# Patient Record
Sex: Female | Born: 1935
Health system: Southern US, Community
[De-identification: ages and names within clinical notes are randomized; demographics above are authoritative.]

## PROBLEM LIST (undated history)

## (undated) DIAGNOSIS — T7840XA Allergy, unspecified, initial encounter: Secondary | ICD-10-CM

## (undated) DIAGNOSIS — E785 Hyperlipidemia, unspecified: Secondary | ICD-10-CM

## (undated) DIAGNOSIS — M1990 Unspecified osteoarthritis, unspecified site: Secondary | ICD-10-CM

## (undated) DIAGNOSIS — M8580 Other specified disorders of bone density and structure, unspecified site: Secondary | ICD-10-CM

## (undated) DIAGNOSIS — I10 Essential (primary) hypertension: Secondary | ICD-10-CM

## (undated) DIAGNOSIS — K469 Unspecified abdominal hernia without obstruction or gangrene: Secondary | ICD-10-CM

## (undated) HISTORY — PX: EYE SURGERY: SHX253

## (undated) HISTORY — DX: Other specified disorders of bone density and structure, unspecified site: M85.80

## (undated) HISTORY — DX: Allergy, unspecified, initial encounter: T78.40XA

## (undated) HISTORY — PX: BACK SURGERY: SHX140

## (undated) HISTORY — PX: KNEE SURGERY: SHX244

## (undated) HISTORY — DX: Unspecified abdominal hernia without obstruction or gangrene: K46.9

## (undated) HISTORY — PX: APPENDECTOMY: SHX54

## (undated) HISTORY — DX: Hyperlipidemia, unspecified: E78.5

## (undated) HISTORY — PX: GUM SURGERY: SHX658

---

## 1999-08-05 ENCOUNTER — Encounter: Payer: Self-pay | Admitting: Specialist

## 1999-08-05 ENCOUNTER — Ambulatory Visit (HOSPITAL_COMMUNITY): Admission: RE | Admit: 1999-08-05 | Discharge: 1999-08-05 | Payer: Self-pay | Admitting: Specialist

## 1999-08-17 ENCOUNTER — Other Ambulatory Visit: Admission: RE | Admit: 1999-08-17 | Discharge: 1999-08-17 | Payer: Self-pay | Admitting: Family Medicine

## 2000-04-14 ENCOUNTER — Encounter (INDEPENDENT_AMBULATORY_CARE_PROVIDER_SITE_OTHER): Payer: Self-pay | Admitting: Specialist

## 2000-04-14 ENCOUNTER — Ambulatory Visit (HOSPITAL_COMMUNITY): Admission: RE | Admit: 2000-04-14 | Discharge: 2000-04-14 | Payer: Self-pay | Admitting: Gastroenterology

## 2000-08-08 ENCOUNTER — Encounter: Admission: RE | Admit: 2000-08-08 | Discharge: 2000-08-08 | Payer: Self-pay | Admitting: Family Medicine

## 2000-08-08 ENCOUNTER — Encounter: Payer: Self-pay | Admitting: Family Medicine

## 2000-12-18 ENCOUNTER — Encounter: Admission: RE | Admit: 2000-12-18 | Discharge: 2001-03-18 | Payer: Self-pay | Admitting: Family Medicine

## 2001-05-30 ENCOUNTER — Encounter: Admission: RE | Admit: 2001-05-30 | Discharge: 2001-08-28 | Payer: Self-pay | Admitting: Family Medicine

## 2002-08-18 ENCOUNTER — Emergency Department (HOSPITAL_COMMUNITY): Admission: EM | Admit: 2002-08-18 | Discharge: 2002-08-18 | Payer: Self-pay

## 2003-09-19 ENCOUNTER — Encounter: Admission: RE | Admit: 2003-09-19 | Discharge: 2003-09-19 | Payer: Self-pay | Admitting: Family Medicine

## 2003-10-01 ENCOUNTER — Other Ambulatory Visit: Admission: RE | Admit: 2003-10-01 | Discharge: 2003-10-01 | Payer: Self-pay | Admitting: Family Medicine

## 2004-03-31 ENCOUNTER — Encounter: Admission: RE | Admit: 2004-03-31 | Discharge: 2004-03-31 | Payer: Self-pay | Admitting: Family Medicine

## 2005-01-28 ENCOUNTER — Encounter: Admission: RE | Admit: 2005-01-28 | Discharge: 2005-01-28 | Payer: Self-pay | Admitting: Internal Medicine

## 2007-02-19 ENCOUNTER — Inpatient Hospital Stay (HOSPITAL_COMMUNITY): Admission: EM | Admit: 2007-02-19 | Discharge: 2007-02-22 | Payer: Self-pay | Admitting: Emergency Medicine

## 2007-03-30 ENCOUNTER — Encounter: Admission: RE | Admit: 2007-03-30 | Discharge: 2007-03-30 | Payer: Self-pay | Admitting: Family Medicine

## 2008-06-10 ENCOUNTER — Encounter: Admission: RE | Admit: 2008-06-10 | Discharge: 2008-06-10 | Payer: Self-pay | Admitting: Family Medicine

## 2010-11-23 NOTE — H&P (Signed)
NAMECADEY, BAZILE NO.:  [id number]   MEDICAL RECORD NO.:  [id number]          PATIENT TYPE:  EMS   LOCATION:  ED                           FACILITY:  [facility]   PHYSICIAN:  Isidor Holts, M.D.  DATE OF BIRTH:  [birth year]   DATE OF ADMISSION:  02/19/2007  DATE OF DISCHARGE:                              HISTORY & PHYSICAL   PMD:  Immunologist Care.   CHIEF COMPLAINT:  Unwell for one week.  Transient diarrhea approximately  5 days ago, increasing weakness, occasional mild cough, ache across  shoulders, fever of 101 on February 18, 2007.   HISTORY OF PRESENT ILLNESS:  This is an 77 year old female.  For past  medical history, see below.  According to the patient, she had been  unwell since approximately one week ago, initially with burning on  urination, as a matter of fact, has had urinalysis done twice by her  primary MD at [facility], and these were negative.  Approximately 5 days ago, she had a transient episode of diarrhea, which  has since resolved.  Since then, however, she has felt progressively  weak.  Appetite is diminished.  She is not drinking adequate fluids and  has felt progressively unwell.  She has a mild cough, which is  occasionally productive, however denies shortness of breath.  Has  experienced some pain across her shoulders.  On February 18, 2007, she  went to the urgent care center, where she was commenced on Septra.  She  has taken 2 tablets of this on February 18, 2007.  Her symptoms appear to  be persistent.  She presented to the emergency department.   PAST MEDICAL HISTORY:  1. Status post appendectomy at age 89 years.  2. DJD, status post back surgery in 1963.  3. Osteoarthritis, status post right knee arthroscopy 2-3 years ago.   MEDICATION HISTORY:  Not on any regular medication.  As mentioned above,  she to 2 tablets of Septra prescribed by urgent care center on February 18, 2007.   ALLERGIES:  No known drug  allergies.   REVIEW OF SYSTEMS:  As per HPI and chief complaint.  The patient was  reportedly noted to have a temperature of 101 at the urgent care center  on February 18, 2007.  Has had lower abdominal pain, although this has  resolved.  Has had only very small bowel movements during the course of  the last 3-4 days.  In addition, has been troubled by skin eruptions,  one on the right side of her neck, another just below her left axilla,  and yet another on the left upper anterior chest.  She claims she had  been prescribed some cream by her primary MD at [facility].  The lesions appear to have improved somewhat.  Systems review is  otherwise negative.   SOCIAL HISTORY:  The patient resides alone, is quite independent doing  her own cooking and shopping.  Her family comes to visit often.  She is  a nonsmoker, nondrinker.  Has no history of drug abuse.  Is single.  FAMILY HISTORY:  Noncontributory.   PHYSICAL EXAMINATION:  VITALS:  Temperature 100.0, pulse 84 per minute  and regular, respiratory rate 22, BP 119/46 mmHg, pulse oximeter 97% on  2 L of oxygen.  The patient does not appear to be in obvious acute distress, alert,  communicative, not short of breath at rest.  HEENT: No clinical pallor or jaundice.  No conjunctival injection.  Throat:  The visible mucous membranes appear quite dry.  NECK:  Supple.  JVP not seen.  No palpable lymphadenopathy.  No palpable  goiter.  SKIN:  A pyodermatous lesion which appears partially healed on the right  side of her neck just above the clavicle.  There is a similar lesion  just below the right axillary region and also left upper anterior chest.  CHEST:  Clinically clear to auscultation.  No wheezes, no  crackles.  HEART:  Sounds 1 and 2 heard, normal, regular, no murmurs.  ABDOMEN:  Obese, soft, nontender.  Old lower abdominal laparotomy scar  is noted.  No palpable organomegaly.  No palpable masses.  Normal bowel  sounds.  LOWER  EXTREMITY EXAMINATION:  No pitting edema.  Palpable peripheral  pulses.  MUSCULOSKELETAL SYSTEM:  Unremarkable apart from osteoarthritic changes.  CENTRAL NERVOUS SYSTEM:  No focal neurologic deficit on gross  examination.   INVESTIGATIONS:  CBC:  WBC 13.8, hemoglobin 15.2, hematocrit 42.8,  platelets 264.  Electrolytes:  Sodium 131, potassium 3.6, chloride 99,  CO2 21, BUN 13, creatinine 0.94, glucose 233, AST 23, ALT 21, alkaline  phosphatase 68.  Urinalysis is negative.  Chest x-ray dated February 19, 2007, shows minimal airspace disease, left base.   ASSESSMENT AND PLAN:  1. Mild left lower lobe community-acquired pneumonia.  We shall admit      the patient.  Institute intravenous antibiotics with      Rocephin/Azithromycin, also utilize Mucinex and p.r.n.      bronchodilator nebulizers/oxygen supplementation.   1. Pyodermatous skin lesions.  These appear practically healed;      however, any residual lesions will respond to antibiotic treatment      as above.   1. Dehydration.  This is secondary to poor oral intake, and recent      transient diarrhea.  We shall manage with intravenous infusion of      normal saline.   1. Mild hyponatremia.  This is secondary to volume depletion and      should respond to normal saline.   Further management will depend on clinical course.      Isidor Holts, M.D.  Electronically Signed     CO/MEDQ  D:  02/19/2007  T:  02/20/2007  Job:  [id number]

## 2010-11-23 NOTE — Discharge Summary (Signed)
[name], Catherine Ellis              ACCOUNT NO.:  [id number]   MEDICAL RECORD NO.:  [id number]          PATIENT TYPE:  INP   LOCATION:  1323                         FACILITY:  [facility]   PHYSICIAN:  Lonia Blood, M.D.      DATE OF BIRTH:  09-16-1921   DATE OF ADMISSION:  02/19/2007  DATE OF DISCHARGE:  02/21/2007                               DISCHARGE SUMMARY   PRIMARY CARE PHYSICIAN:  Immunologist Care.   DISCHARGE DIAGNOSIS:  1. Community-acquired pneumonia.  2. Hyponatremia.  3. Dehydration.  4. Tooth abscess.   DISCHARGE MEDICATIONS:  Levaquin 500 mg daily for 5 days.   DISPOSITION:  The patient will be discharged home.  She will have a  follow up with [facility] in about a week.  She has had good  physical therapy and occupational therapy evaluation; and she is going  to need no home therapy at this point.   PROCEDURES PERFORMED INCLUDE:  Chest x-ray on August 11 that showed  minimal air space disease at the left base most likely represents  atelectasis or early infection.  Follow-up chest x-ray on August 13 that  shows mildly reduced left basilar opacity differential diagnosis  includes scattered subsegmental atelectasis or mildly improving  bronchopneumonia.   CONSULTATIONS:  None.   BRIEF HISTORY AND PHYSICAL:  Please refer to dictated history and  physical by Dr. Isidor Holts.  In short, however, the patient is an  77 year old female remarkably healthy with no significant past medical  history presenting with 1 week history of feeling unwell, increasing  weakness, some diarrhea, cough and aching.  The patient also had a fever  of 101 the day prior to coming in.  She was evaluated in the ER with a  mild left lower lobe infiltrate consistent with possible pneumonia.  She  also had some pyodermatous skin lesions, some dehydration, and mild  hyponatremia with a sodium of 131.  She was subsequently admitted for  further management.   HOSPITAL COURSE:   Problem #1: COMMUNITY-ACQUIRED PNEUMONIA.  The patient  was put on Rocephin and Zithromax during this hospitalization.  She had  no fever and she seemed to improve tremendously.  Her weakness also  responded to extensive physical therapy and occupational therapy.   Problem #2:  HYPONATREMIA.  She received some saline and her sodium has  since normalized.   Problem #3:  DEHYDRATION.  Again, the patient was hydrated effectively  during this hospitalization.   Problem #4:  PYODERMATOUS SKIN LESIONS.  This was mostly healed at the  time of arrival; but continued antibiotic therapy probably helped some,  with Rocephin.   Problem #5:  TOOTH ABSCESS.  The patient complained of tooth abscess at  the time of discharge.  We are giving her some Levaquin that should  cover both the tooth abscess and her pneumonia.  She is asked to follow  with her dentist for further management.      Lonia Blood, M.D.  Electronically Signed     LG/MEDQ  D:  02/22/2007  T:  02/23/2007  Job:  [id number]

## 2010-11-26 NOTE — Procedures (Signed)
[facility]  Patient:    Catherine Ellis, Catherine Ellis                       MRN: [id number] Proc. Date: 04/14/00 Adm. Date:  [id number] Attending:  Nelda Marseille CC:         Dellis Anes. Idell Pickles, M.D.   Procedure Report  PROCEDURE:  Colonoscopy with biopsy.  SURGEON:  Petra Kuba, M.D.  INDICATIONS:  Patient with history of colon polyps, due for colonic screening. Some vague lower periodic abdominal discomfort.  Consent was signed after risks, benefits, methods, and options were thoroughly discussed in the office on multiple occasions.  MEDICINES USED:  Demerol 50 and Versed 5.  DESCRIPTION OF PROCEDURE:  Rectal inspection is pertinent for external hemorrhoids.  Digital exam was negative.  The pediatric video colonoscope was inserted and easily advanced to the splenic flexure.  Unfortunately, at that point, the scope began to loop, and we could not control the loop using abdominal pressure or position changes despite rolling her on her back and then her right side.  We elected to slowly withdraw.  No abnormalities were seen as we slowly withdrew except for some left-sided diverticuli and tortuosity.  Once back in the rectum, the previous polypectomy site was seen with a nice white scar.  The scope was then retroflexed revealing some internal hemorrhoids.  The pediatric video colonoscope was removed and then the video colonoscope was inserted and with surprising ease, advanced around the colon to the cecum once it had looped at the _______ placed in the splenic flexure, and left lower quadrant pressure was applied, and we were able to advance to the cecum which was identified by the appendiceal orifice and the ileocecal valve.  The scope was slowly withdrawn.  The prep was adequate.  There was some liquid stool still that required washing and suctioning.  The cecum, ascending, and transverse was normal.  In the proximal level of the splenic flexure, a small 2  mm polyp was seen and was cold biopsied x 2.  The scope was further withdrawn, and other than the left-sided diverticuli, and tortuosity and some spasm, no other abnormalities were seen.  The anorectal pull through had confirmed the hemorrhoids.  We did not reretroflex since we did that with the pediatric video colonoscope.  The scope was reinserted a short ways up the sigmoid.  Air was suctioned and the scope removed.  The patient tolerated the procedure well and there was no obvious or immediate complication.  ENDOSCOPIC DIAGNOSES: 1. Internal and external hemorrhoids. 2. Sigmoid diverticuli and tortuosity. 3. Tiny splenic flexure polyp, status post cold biopsy. 4. Otherwise within normal limits to the cecum.  PLAN:  Will await pathology, but if doing well medically, consider repeat screening in five years.  Will be happy to see back p.r.n., otherwise return care to Dr. Idell Pickles for the customary yearly rectals and guaiacs.  If abdominal pain continues, will probably get a CAT scan next, although possibly just due to adhesion and will be happy to see back for this if needed.  We want to try to antispasmodics as well, but will leave that to Dr. Idell Pickles as well. DD:  04/14/00 TD:  04/16/00 Job: [id number]

## 2011-04-25 LAB — CBC
HCT: 36.2
HCT: 36.3
HCT: 42.8
Hemoglobin: 12.6
Hemoglobin: 12.7
Hemoglobin: 15.2 — ABNORMAL HIGH
MCHC: 34.6
MCHC: 35.1
MCHC: 35.5
MCV: 87.5
MCV: 88.1
MCV: 88.4
Platelets: 252
Platelets: 262
Platelets: 264
RBC: 4.11
RBC: 4.11
RBC: 4.89
RDW: 13.1
RDW: 13.2
RDW: 13.3
WBC: 10.4
WBC: 11 — ABNORMAL HIGH
WBC: 13.8 — ABNORMAL HIGH

## 2011-04-25 LAB — DIFFERENTIAL
Basophils Absolute: 0
Basophils Relative: 0
Eosinophils Absolute: 0
Eosinophils Relative: 0
Lymphocytes Relative: 7 — ABNORMAL LOW
Lymphs Abs: 0.9
Monocytes Absolute: 0.8 — ABNORMAL HIGH
Monocytes Relative: 6
Neutro Abs: 12.1 — ABNORMAL HIGH
Neutrophils Relative %: 88 — ABNORMAL HIGH

## 2011-04-25 LAB — COMPREHENSIVE METABOLIC PANEL
ALT: 21
AST: 23
Albumin: 3.2 — ABNORMAL LOW
Alkaline Phosphatase: 68
BUN: 13
CO2: 21
Calcium: 8.8
Chloride: 99
Creatinine, Ser: 0.94
GFR calc non Af Amer: 57 — ABNORMAL LOW
Glucose, Bld: 233 — ABNORMAL HIGH
Potassium: 3.6
Sodium: 131 — ABNORMAL LOW
Total Bilirubin: 0.5
Total Protein: 7.2

## 2011-04-25 LAB — BASIC METABOLIC PANEL
BUN: 10
BUN: 8
BUN: 9
CO2: 24
CO2: 25
CO2: 28
Calcium: 8.2 — ABNORMAL LOW
Calcium: 8.4
Calcium: 8.7
Chloride: 101
Chloride: 102
Chloride: 102
Creatinine, Ser: 0.78
Creatinine, Ser: 0.83
Creatinine, Ser: 0.91
GFR calc non Af Amer: 59 — ABNORMAL LOW
GFR calc non Af Amer: >60
GFR calc non Af Amer: >60
Glucose, Bld: 132 — ABNORMAL HIGH
Glucose, Bld: 138 — ABNORMAL HIGH
Glucose, Bld: 146 — ABNORMAL HIGH
Potassium: 3.5
Potassium: 3.6
Potassium: 4
Sodium: 132 — ABNORMAL LOW
Sodium: 133 — ABNORMAL LOW
Sodium: 136

## 2011-04-25 LAB — LIPID PANEL
HDL: 30 — ABNORMAL LOW
Total CHOL/HDL Ratio: 6.1
Triglycerides: 168 — ABNORMAL HIGH
VLDL: 34

## 2011-04-25 LAB — URINE CULTURE
Colony Count: NO GROWTH
Culture: NO GROWTH

## 2011-04-25 LAB — URINE MICROSCOPIC-ADD ON

## 2011-04-25 LAB — URINALYSIS, ROUTINE W REFLEX MICROSCOPIC
Bilirubin Urine: NEGATIVE
Glucose, UA: NEGATIVE
Ketones, ur: NEGATIVE
Leukocytes, UA: NEGATIVE
Nitrite: NEGATIVE
Protein, ur: 100 — AB
Specific Gravity, Urine: 1.03
Urobilinogen, UA: 0.2
pH: 6

## 2011-04-25 LAB — CULTURE, BLOOD (ROUTINE X 2)
Culture: NO GROWTH
Culture: NO GROWTH

## 2011-04-25 LAB — TSH: TSH: 1.313

## 2011-04-25 LAB — B-NATRIURETIC PEPTIDE (CONVERTED LAB): Pro B Natriuretic peptide (BNP): 93.4

## 2011-06-12 ENCOUNTER — Encounter: Payer: Self-pay | Admitting: Emergency Medicine

## 2011-06-12 ENCOUNTER — Emergency Department (HOSPITAL_COMMUNITY)
Admission: EM | Admit: 2011-06-12 | Discharge: 2011-06-12 | Disposition: A | Discharge status: Home / Self Care | Payer: Medicare Other | Attending: Emergency Medicine | Admitting: Emergency Medicine

## 2011-06-12 ENCOUNTER — Emergency Department (HOSPITAL_COMMUNITY): Payer: Medicare Other

## 2011-06-12 DIAGNOSIS — I10 Essential (primary) hypertension: Secondary | ICD-10-CM | POA: Insufficient documentation

## 2011-06-12 DIAGNOSIS — W010XXA Fall on same level from slipping, tripping and stumbling without subsequent striking against object, initial encounter: Secondary | ICD-10-CM | POA: Insufficient documentation

## 2011-06-12 DIAGNOSIS — J3489 Other specified disorders of nose and nasal sinuses: Secondary | ICD-10-CM | POA: Insufficient documentation

## 2011-06-12 DIAGNOSIS — S0003XA Contusion of scalp, initial encounter: Secondary | ICD-10-CM | POA: Insufficient documentation

## 2011-06-12 DIAGNOSIS — S1093XA Contusion of unspecified part of neck, initial encounter: Secondary | ICD-10-CM | POA: Insufficient documentation

## 2011-06-12 DIAGNOSIS — IMO0002 Reserved for concepts with insufficient information to code with codable children: Secondary | ICD-10-CM | POA: Insufficient documentation

## 2011-06-12 DIAGNOSIS — W19XXXA Unspecified fall, initial encounter: Secondary | ICD-10-CM

## 2011-06-12 DIAGNOSIS — Y92009 Unspecified place in unspecified non-institutional (private) residence as the place of occurrence of the external cause: Secondary | ICD-10-CM | POA: Insufficient documentation

## 2011-06-12 DIAGNOSIS — E119 Type 2 diabetes mellitus without complications: Secondary | ICD-10-CM | POA: Insufficient documentation

## 2011-06-12 DIAGNOSIS — Y93K1 Activity, walking an animal: Secondary | ICD-10-CM | POA: Insufficient documentation

## 2011-06-12 DIAGNOSIS — S0083XA Contusion of other part of head, initial encounter: Secondary | ICD-10-CM

## 2011-06-12 HISTORY — DX: Unspecified osteoarthritis, unspecified site: M19.90

## 2011-06-12 HISTORY — DX: Essential (primary) hypertension: I10

## 2011-06-12 NOTE — ED Notes (Signed)
Pt states she tripped over a dog while in driveway, abrasions noted to nose, forehead. Pt also c/o L knee pain. Pt A & O. Denies LOC. Family at bedside

## 2011-06-12 NOTE — ED Provider Notes (Signed)
History     CSN: [id number] Arrival date & time: 06/12/2011  7:57 PM   First MD Initiated Contact with Patient 06/12/11 2154      Chief Complaint  Patient presents with  . Fall    (Consider location/radiation/quality/duration/timing/severity/associated sxs/prior treatment) HPI... accidental trip and fall this afternoon while walking dog. Struck forehead and left knee. Loss of consciousness or neurological deficits. Has abrasion on forehead and left knee. Palpation makes pain worse. No neck pain.  Pain is minimal  Past Medical History  Diagnosis Date  . Arthritis   . Diabetes mellitus   . Hypertension     Past Surgical History  Procedure Date  . Back surgery     1965  . Knee surgery   . Appendectomy     No family history on file.  History  Substance Use Topics  . Smoking status: Never Smoker   . Smokeless tobacco: Not on file  . Alcohol Use: No    OB History    Grav Para Term Preterm Abortions TAB SAB Ect Mult Living                  Review of Systems  All other systems reviewed and are negative.    Allergies  Review of patient's allergies indicates no known allergies.  Home Medications   Current Outpatient Rx  Name Route Sig Dispense Refill  . PRESCRIPTION MEDICATION Oral Take 1 tablet by mouth daily.     Marland Kitchen PRESCRIPTION MEDICATION Oral Take 1 tablet by mouth 2 (two) times daily.        BP 180/68  Pulse 113  Temp(Src) 97.9 F (36.6 C) (Oral)  Resp 18  Wt 160 lb (72.576 kg)  SpO2 97%  Physical Exam  Nursing note and vitals reviewed. Constitutional: She is oriented to person, place, and time. She appears well-developed and well-nourished.  HENT:  Head: Normocephalic.       Superficial abrasion in mid forehead, approximately 2 cm square;  minimal abrasion of left knee  Eyes: Conjunctivae and EOM are normal. Pupils are equal, round, and reactive to light.  Neck: Normal range of motion. Neck supple.  Cardiovascular: Normal rate and regular  rhythm.   Pulmonary/Chest: Effort normal and breath sounds normal.  Abdominal: Soft. Bowel sounds are normal.  Musculoskeletal: Normal range of motion.  Neurological: She is alert and oriented to person, place, and time.  Skin: Skin is warm and dry.  Psychiatric: She has a normal mood and affect.    ED Course  Procedures (including critical care time)  Labs Reviewed - No data to display Ct Head Wo Contrast  06/12/2011  *RADIOLOGY REPORT*  Clinical Data: Status post fall.  Forehead and nasal abrasions.  CT HEAD WITHOUT CONTRAST  Technique:  Contiguous axial images were obtained from the base of the skull through the vertex without contrast.  Comparison: None.  Findings: There is soft tissue swelling in the left frontal scalp. There is no evidence of acute intracranial hemorrhage, mass lesion, brain edema or extra-axial fluid collection.  The ventricles and subarachnoid spaces are appropriately sized for age.  There is no CT evidence of acute cortical infarction.  Mild chronic small vessel ischemic changes are present within the periventricular and subcortical white matter.  The visualized paranasal sinuses are clear aside from near-complete opacification of the left maxillary sinus.  No air-fluid levels are identified and mastoids and middle ears are clear. The calvarium is intact.  IMPRESSION:  1.  Left frontal scalp soft  tissue swelling. 2.  No acute intracranial or calvarial findings. 3.  Chronic left maxillary sinus disease.  Original Report Authenticated By: Gerrianne Scale, M.D.     1. Fall   2. Contusion of forehead       MDM  Will obtain CT head rule out subdural hematoma.  He is alert and oriented. Anticipate normal CT.        Donnetta Hutching, MD 06/12/11 [phone]

## 2011-06-12 NOTE — ED Notes (Signed)
Pt tripped over dog today falling face first onto driveway. Abrasions noted to bride of nose, L knee and forehead, pt denies LOC

## 2011-06-26 ENCOUNTER — Ambulatory Visit (INDEPENDENT_AMBULATORY_CARE_PROVIDER_SITE_OTHER): Payer: Medicare Other

## 2011-06-26 DIAGNOSIS — I10 Essential (primary) hypertension: Secondary | ICD-10-CM

## 2011-06-26 DIAGNOSIS — E119 Type 2 diabetes mellitus without complications: Secondary | ICD-10-CM

## 2011-07-14 DIAGNOSIS — M5137 Other intervertebral disc degeneration, lumbosacral region: Secondary | ICD-10-CM | POA: Diagnosis not present

## 2011-07-14 DIAGNOSIS — M25559 Pain in unspecified hip: Secondary | ICD-10-CM | POA: Diagnosis not present

## 2011-07-18 DIAGNOSIS — M25559 Pain in unspecified hip: Secondary | ICD-10-CM | POA: Diagnosis not present

## 2011-07-25 DIAGNOSIS — M25559 Pain in unspecified hip: Secondary | ICD-10-CM | POA: Diagnosis not present

## 2011-08-23 ENCOUNTER — Telehealth: Payer: Self-pay

## 2011-08-23 NOTE — Telephone Encounter (Signed)
CALLED PT, BUSY TIMES 3. TRY TO CB LATER

## 2011-08-23 NOTE — Telephone Encounter (Signed)
.  [facility] PT WOULD LIKE A CALL BACK FROM DR Hal Hope REGARDING THE DOSAGE INCREASE ON HER BP MEDS PLEASE CALL [phone]

## 2011-08-24 ENCOUNTER — Telehealth: Payer: Self-pay

## 2011-08-24 NOTE — Telephone Encounter (Signed)
Pharmacist states med does not come in 10/25. It comes 10/12.5 and 20/25.
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
## 2011-08-24 NOTE — Telephone Encounter (Signed)
Lisinopril 20 mg daily called into [facility] #30/2RF

## 2011-08-24 NOTE — Telephone Encounter (Signed)
Please call in lisinopril-hct 10/25 daily #30/2 refills

## 2011-09-14 ENCOUNTER — Telehealth: Payer: Self-pay

## 2011-09-14 MED ORDER — METFORMIN HCL 500 MG PO TABS: 500.0000 mg | ORAL_TABLET | Freq: Two times a day (BID) | ORAL | Status: DC

## 2011-09-14 NOTE — Telephone Encounter (Signed)
PATIENT NEEDS A REFILL FROM DR. RICHTER ON HER MEDIFORMAN 500 MG.   USING THE [facility] [address]

## 2011-09-14 NOTE — Telephone Encounter (Signed)
RX SENT INTO PHARMACY AND PT NOTIFIED

## 2011-10-27 NOTE — Telephone Encounter (Signed)
test

## 2011-11-25 ENCOUNTER — Other Ambulatory Visit: Payer: Self-pay | Admitting: Family Medicine

## 2011-11-25 ENCOUNTER — Other Ambulatory Visit: Payer: Self-pay | Admitting: *Deleted

## 2011-11-25 MED ORDER — LISINOPRIL 20 MG PO TABS: 20.0000 mg | ORAL_TABLET | Freq: Every day | ORAL | Status: DC

## 2011-12-01 ENCOUNTER — Encounter: Payer: Self-pay | Admitting: Family Medicine

## 2011-12-01 ENCOUNTER — Ambulatory Visit (INDEPENDENT_AMBULATORY_CARE_PROVIDER_SITE_OTHER): Payer: Medicare Other | Admitting: Family Medicine

## 2011-12-01 VITALS — BP 140/70 | HR 85 | Temp 97.5°F | Resp 18 | Ht 62.5 in | Wt 160.2 lb

## 2011-12-01 DIAGNOSIS — N3941 Urge incontinence: Secondary | ICD-10-CM

## 2011-12-01 DIAGNOSIS — I10 Essential (primary) hypertension: Secondary | ICD-10-CM | POA: Diagnosis not present

## 2011-12-01 DIAGNOSIS — M545 Low back pain, unspecified: Secondary | ICD-10-CM

## 2011-12-01 DIAGNOSIS — F32A Depression, unspecified: Secondary | ICD-10-CM

## 2011-12-01 DIAGNOSIS — F329 Major depressive disorder, single episode, unspecified: Secondary | ICD-10-CM | POA: Diagnosis not present

## 2011-12-01 DIAGNOSIS — E119 Type 2 diabetes mellitus without complications: Secondary | ICD-10-CM | POA: Diagnosis not present

## 2011-12-01 LAB — POCT URINALYSIS DIPSTICK
Bilirubin, UA: NEGATIVE
Glucose, UA: NEGATIVE
Ketones, UA: NEGATIVE
Leukocytes, UA: NEGATIVE
Nitrite, UA: NEGATIVE
Protein, UA: NEGATIVE
Spec Grav, UA: 1.02
Urobilinogen, UA: 0.2
pH, UA: 5.5

## 2011-12-01 LAB — POCT UA - MICROSCOPIC ONLY
Bacteria, U Microscopic: NEGATIVE
Casts, Ur, LPF, POC: NEGATIVE
Crystals, Ur, HPF, POC: NEGATIVE
Epithelial cells, urine per micros: NEGATIVE
Mucus, UA: NEGATIVE
WBC, Ur, HPF, POC: NEGATIVE
Yeast, UA: NEGATIVE

## 2011-12-01 LAB — POCT CBC
Granulocyte percent: 47.9 %G (ref 37–80)
HCT, POC: 44.6 % (ref 37.7–47.9)
Hemoglobin: 14.5 g/dL (ref 12.2–16.2)
Lymph, poc: 4.9 — AB (ref 0.6–3.4)
MCH, POC: 29.5 pg (ref 27–31.2)
MCHC: 32.5 g/dL (ref 31.8–35.4)
MCV: 90.6 fL (ref 80–97)
MID (cbc): 0.8 (ref 0–0.9)
MPV: 9.6 fL (ref 0–99.8)
POC Granulocyte: 5.3 (ref 2–6.9)
POC LYMPH PERCENT: 44.7 %L (ref 10–50)
POC MID %: 7.4 %M (ref 0–12)
Platelet Count, POC: 388 10*3/uL (ref 142–424)
RBC: 4.92 M/uL (ref 4.04–5.48)
RDW, POC: 13.6 %
WBC: 11 10*3/uL — AB (ref 4.6–10.2)

## 2011-12-01 LAB — COMPREHENSIVE METABOLIC PANEL
ALT: 12 U/L (ref 0–35)
AST: 16 U/L (ref 0–37)
Albumin: 4.7 g/dL (ref 3.5–5.2)
Alkaline Phosphatase: 65 U/L (ref 39–117)
BUN: 15 mg/dL (ref 6–23)
CO2: 26 mEq/L (ref 19–32)
Calcium: 10.1 mg/dL (ref 8.4–10.5)
Chloride: 103 mEq/L (ref 96–112)
Creat: 0.78 mg/dL (ref 0.50–1.10)
Glucose, Bld: 102 mg/dL — ABNORMAL HIGH (ref 70–99)
Potassium: 5.2 mEq/L (ref 3.5–5.3)
Sodium: 140 mEq/L (ref 135–145)
Total Bilirubin: 0.8 mg/dL (ref 0.3–1.2)
Total Protein: 7.9 g/dL (ref 6.0–8.3)

## 2011-12-01 LAB — POCT GLYCOSYLATED HEMOGLOBIN (HGB A1C): Hemoglobin A1C: 6.1

## 2011-12-01 MED ORDER — TOLTERODINE TARTRATE ER 2 MG PO CP24: 2.0000 mg | ORAL_CAPSULE | Freq: Every day | ORAL | Status: DC

## 2011-12-01 MED ORDER — LISINOPRIL 20 MG PO TABS
20.0000 mg | ORAL_TABLET | Freq: Every day | ORAL | Status: DC
Start: 2011-12-01 — End: 2012-04-13

## 2011-12-01 MED ORDER — METFORMIN HCL 500 MG PO TABS: 500.0000 mg | ORAL_TABLET | Freq: Two times a day (BID) | ORAL | Status: DC
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
## 2011-12-04 ENCOUNTER — Encounter: Payer: Self-pay | Admitting: *Deleted

## 2011-12-16 ENCOUNTER — Telehealth: Payer: Self-pay | Admitting: Radiology

## 2011-12-16 ENCOUNTER — Ambulatory Visit (INDEPENDENT_AMBULATORY_CARE_PROVIDER_SITE_OTHER): Payer: Medicare Other | Admitting: Emergency Medicine

## 2011-12-16 VITALS — BP 135/67 | HR 86 | Temp 97.7°F | Resp 20 | Ht 62.0 in | Wt 162.0 lb

## 2011-12-16 DIAGNOSIS — Z9229 Personal history of other drug therapy: Secondary | ICD-10-CM

## 2011-12-16 DIAGNOSIS — Z23 Encounter for immunization: Secondary | ICD-10-CM

## 2011-12-16 DIAGNOSIS — B652 Schistosomiasis due to Schistosoma japonicum: Secondary | ICD-10-CM

## 2011-12-16 MED ORDER — TETANUS-DIPHTH-ACELL PERTUSSIS 5-2.5-18.5 LF-MCG/0.5 IM SUSP
0.5000 mL | Freq: Once | INTRAMUSCULAR | Status: AC
  Administered 2011-12-16: 0.5 mL via INTRAMUSCULAR

## 2011-12-16 MED ORDER — TUBERCULIN PPD 5 UNIT/0.1ML ID SOLN
5.0000 [IU] | Freq: Once | INTRADERMAL | Status: AC
  Administered 2011-12-16: 0.5 [IU] via INTRADERMAL

## 2011-12-16 NOTE — Telephone Encounter (Signed)
Pt CB and I gave her info that it looks like pt needs to have the Tdap and PPD done. Pt agreed to come in and have these done so that form can be complete. Form is in the front of pt's chart.

## 2011-12-16 NOTE — Telephone Encounter (Signed)
I left mssg for patient to call back about the forms for Dr Hal Hope to fill out. There is an area on form patient must complete. Also it looks like patient needs PPD and Tdap done in the office.

## 2011-12-16 NOTE — Progress Notes (Signed)
  Subjective:    Patient ID: Catherine Ellis, female    DOB: 1922/03/02, 78 y.o.   MRN: [id number]  HPI patient wants to go to  Friend's home to live. Dr. Alecia Lemming has completed the forms have her she needs a Tdap and PPD.    Review of Systems     Objective:   Physical Exam patient not examined to        Assessment & Plan:  Tdap and PPD and prescription for shingles.

## 2011-12-19 ENCOUNTER — Ambulatory Visit (INDEPENDENT_AMBULATORY_CARE_PROVIDER_SITE_OTHER): Payer: Medicare Other

## 2011-12-19 DIAGNOSIS — Z111 Encounter for screening for respiratory tuberculosis: Secondary | ICD-10-CM

## 2011-12-19 LAB — TB SKIN TEST
Induration: 0 mm
TB Skin Test: NEGATIVE

## 2011-12-29 ENCOUNTER — Other Ambulatory Visit: Payer: Self-pay | Admitting: Physician Assistant

## 2012-03-16 DIAGNOSIS — E119 Type 2 diabetes mellitus without complications: Secondary | ICD-10-CM | POA: Diagnosis not present

## 2012-03-16 DIAGNOSIS — Z961 Presence of intraocular lens: Secondary | ICD-10-CM | POA: Diagnosis not present

## 2012-03-16 DIAGNOSIS — H52209 Unspecified astigmatism, unspecified eye: Secondary | ICD-10-CM | POA: Diagnosis not present

## 2012-04-13 ENCOUNTER — Encounter: Payer: Self-pay | Admitting: Family Medicine

## 2012-04-13 ENCOUNTER — Ambulatory Visit (INDEPENDENT_AMBULATORY_CARE_PROVIDER_SITE_OTHER): Payer: Medicare Other | Admitting: Family Medicine

## 2012-04-13 VITALS — BP 120/84 | HR 97 | Temp 98.1°F | Resp 16 | Ht 63.0 in | Wt 169.0 lb

## 2012-04-13 DIAGNOSIS — E119 Type 2 diabetes mellitus without complications: Secondary | ICD-10-CM | POA: Insufficient documentation

## 2012-04-13 DIAGNOSIS — K6289 Other specified diseases of anus and rectum: Secondary | ICD-10-CM

## 2012-04-13 DIAGNOSIS — Z79899 Other long term (current) drug therapy: Secondary | ICD-10-CM | POA: Diagnosis not present

## 2012-04-13 DIAGNOSIS — Z9189 Other specified personal risk factors, not elsewhere classified: Secondary | ICD-10-CM | POA: Insufficient documentation

## 2012-04-13 DIAGNOSIS — R32 Unspecified urinary incontinence: Secondary | ICD-10-CM | POA: Insufficient documentation

## 2012-04-13 DIAGNOSIS — Z23 Encounter for immunization: Secondary | ICD-10-CM | POA: Diagnosis not present

## 2012-04-13 LAB — LIPID PANEL
Cholesterol: 277 mg/dL — ABNORMAL HIGH (ref 0–200)
HDL: 57 mg/dL (ref >39)
LDL Cholesterol: 173 mg/dL — ABNORMAL HIGH (ref 0–99)
Total CHOL/HDL Ratio: 4.9 Ratio
Triglycerides: 234 mg/dL — ABNORMAL HIGH (ref <150)
VLDL: 47 mg/dL — ABNORMAL HIGH (ref 0–40)

## 2012-04-13 LAB — POCT GLYCOSYLATED HEMOGLOBIN (HGB A1C): Hemoglobin A1C: 5.5
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
## 2012-04-14 LAB — MICROALBUMIN, URINE: Microalb, Ur: 2.12 mg/dL — ABNORMAL HIGH (ref 0.00–1.89)

## 2012-04-14 NOTE — Progress Notes (Signed)
Reviewed and agree.

## 2012-05-15 ENCOUNTER — Other Ambulatory Visit: Payer: Self-pay | Admitting: Family Medicine

## 2012-05-15 DIAGNOSIS — E785 Hyperlipidemia, unspecified: Secondary | ICD-10-CM

## 2012-05-15 DIAGNOSIS — E119 Type 2 diabetes mellitus without complications: Secondary | ICD-10-CM

## 2012-05-15 MED ORDER — LISINOPRIL 5 MG PO TABS: 5.0000 mg | ORAL_TABLET | Freq: Every day | ORAL | Status: DC

## 2012-05-15 MED ORDER — PRAVASTATIN SODIUM 40 MG PO TABS: 40.0000 mg | ORAL_TABLET | Freq: Every day | ORAL | Status: DC

## 2012-05-17 ENCOUNTER — Telehealth: Payer: Self-pay

## 2012-05-17 NOTE — Telephone Encounter (Signed)
Spoke with patient and she has been having a burning, thick, clear, phylum at night off and on for about a week.

## 2012-05-17 NOTE — Telephone Encounter (Signed)
The patient stated that she has been experiencing acid reflux symptoms.  The patient stated that she has recently moved to [facility] and now has a different lifestyle and eating pattern, but she is wanting to know what she should do to treat the symptoms.  Please call the patient at [phone].

## 2012-05-17 NOTE — Telephone Encounter (Signed)
I spoke to patient to advise of need for visit. She was transferred to appt scheduling.

## 2012-05-17 NOTE — Telephone Encounter (Signed)
She needs to be evaluated.  She can come in and be seen here (She saw Dr. Clelia Croft last), or she can explore establishing with [facility], that has providers see patients at [facility].

## 2012-05-18 ENCOUNTER — Ambulatory Visit (INDEPENDENT_AMBULATORY_CARE_PROVIDER_SITE_OTHER): Payer: Medicare Other | Admitting: Family Medicine

## 2012-05-18 ENCOUNTER — Encounter: Payer: Self-pay | Admitting: Family Medicine

## 2012-05-18 VITALS — BP 124/62 | HR 84 | Temp 97.5°F | Resp 18 | Ht 63.0 in | Wt 174.0 lb

## 2012-05-18 DIAGNOSIS — R5381 Other malaise: Secondary | ICD-10-CM

## 2012-05-18 DIAGNOSIS — L219 Seborrheic dermatitis, unspecified: Secondary | ICD-10-CM

## 2012-05-18 DIAGNOSIS — K219 Gastro-esophageal reflux disease without esophagitis: Secondary | ICD-10-CM | POA: Diagnosis not present

## 2012-05-18 DIAGNOSIS — R5383 Other fatigue: Secondary | ICD-10-CM

## 2012-05-18 DIAGNOSIS — L218 Other seborrheic dermatitis: Secondary | ICD-10-CM | POA: Diagnosis not present

## 2012-05-18 LAB — CBC WITH DIFFERENTIAL/PLATELET
Basophils Absolute: 0.1 10*3/uL (ref 0.0–0.1)
Basophils Relative: 1 % (ref 0–1)
Eosinophils Absolute: 0.4 10*3/uL (ref 0.0–0.7)
Eosinophils Relative: 5 % (ref 0–5)
HCT: 39.6 % (ref 36.0–46.0)
Hemoglobin: 13.6 g/dL (ref 12.0–15.0)
Lymphocytes Relative: 36 % (ref 12–46)
Lymphs Abs: 3.3 10*3/uL (ref 0.7–4.0)
MCH: 30.2 pg (ref 26.0–34.0)
MCHC: 34.3 g/dL (ref 30.0–36.0)
MCV: 88 fL (ref 78.0–100.0)
Monocytes Absolute: 0.9 10*3/uL (ref 0.1–1.0)
Monocytes Relative: 10 % (ref 3–12)
Neutro Abs: 4.5 10*3/uL (ref 1.7–7.7)
Neutrophils Relative %: 48 % (ref 43–77)
Platelets: 333 10*3/uL (ref 150–400)
RBC: 4.5 MIL/uL (ref 3.87–5.11)
RDW: 13.6 % (ref 11.5–15.5)
WBC: 9.2 10*3/uL (ref 4.0–10.5)

## 2012-05-18 LAB — TSH: TSH: 1.791 u[IU]/mL (ref 0.350–4.500)

## 2012-05-18 MED ORDER — OMEPRAZOLE 40 MG PO CPDR: 40.0000 mg | DELAYED_RELEASE_CAPSULE | Freq: Every day | ORAL | Status: DC

## 2012-05-18 NOTE — Progress Notes (Signed)
Subjective:    Patient ID: Catherine Ellis, female    DOB: 1921-11-22, 78 y.o.   MRN: [id number]  HPI  Feeling more fatigued - will get worn out after a few hours. Goes down to dinner at 5, eats at 5:30 and then at 6 will feel so tired she needs to sleep - sometimes sleeps for 12 hrs other times for 2 and then will get up and do some things.  Wonders if she should try to do some exercise after dinner - walking - instead  Nasal congestion and thick clear phlegm in throat - worst in the a.m., occ aleve but no other otc meds, occ burning in chest at night and occ regurg during day. No dysphagia, does have waterbrash but no metallic taste in a.m. noticed.  Has some chronic back pain - went to PT prev which helped but now not driving any longer - she will ask the nurses at [facility] to see if they have PT that comes out that could help her.  Past Medical History  Diagnosis Date  . Arthritis   . Diabetes mellitus   . Hypertension      Review of Systems  Constitutional: Positive for fatigue. Negative for fever, chills, activity change, appetite change and unexpected weight change.  HENT: Positive for congestion. Negative for sore throat, rhinorrhea, trouble swallowing, neck pain, neck stiffness, voice change and postnasal drip.   Respiratory: Negative for cough and shortness of breath.   Cardiovascular: Negative for chest pain and leg swelling.  Gastrointestinal: Positive for diarrhea. Negative for nausea, vomiting, abdominal pain, constipation, blood in stool and abdominal distention.  Genitourinary: Negative for dysuria, decreased urine volume and difficulty urinating.  Musculoskeletal: Positive for back pain and arthralgias. Negative for gait problem.  Skin: Negative for rash.  Hematological: Negative for adenopathy.  Psychiatric/Behavioral: Negative for sleep disturbance.      BP 124/62  Pulse 84  Temp 97.5 F (36.4 C) (Oral)  Resp 18  Ht 5\' 3"  (1.6 m)  Wt 174 lb (78.926 kg)   BMI 30.82 kg/m2 Objective:   Physical Exam  Constitutional: She is oriented to person, place, and time. She appears well-developed and well-nourished. No distress.  HENT:  Head: Normocephalic and atraumatic.  Right Ear: Tympanic membrane, external ear and ear canal normal.  Left Ear: Tympanic membrane, external ear and ear canal normal.  Nose: Nose normal. No mucosal edema or rhinorrhea.  Mouth/Throat: Uvula is midline, oropharynx is clear and moist and mucous membranes are normal. No oropharyngeal exudate.  Eyes: Conjunctivae normal are normal. Right eye exhibits no discharge. Left eye exhibits no discharge. No scleral icterus.  Neck: Normal range of motion. Neck supple. No tracheal deviation present. No thyromegaly present.  Cardiovascular: Normal rate, regular rhythm, normal heart sounds and intact distal pulses.   Pulmonary/Chest: Effort normal and breath sounds normal. No respiratory distress.  Abdominal: Soft. Bowel sounds are normal. She exhibits no distension and no mass. There is no tenderness. There is no rebound and no guarding.  Lymphadenopathy:    She has no cervical adenopathy.  Neurological: She is alert and oriented to person, place, and time.  Skin: Skin is warm and dry. She is not diaphoretic. No erythema.  Psychiatric: She has a normal mood and affect. Her behavior is normal.          Assessment & Plan:   1. Fatigue  CBC with Differential, TSH  2. GERD (gastroesophageal reflux disease)  omeprazole (PRILOSEC) 40 MG capsule. Consider  stopping after sev mos.  3. Seborrheic dermatitis of scalp  Try Selsun blue shampoo.

## 2012-08-17 ENCOUNTER — Ambulatory Visit (INDEPENDENT_AMBULATORY_CARE_PROVIDER_SITE_OTHER): Payer: Medicare Other | Admitting: Family Medicine

## 2012-08-17 ENCOUNTER — Encounter: Payer: Self-pay | Admitting: Family Medicine

## 2012-08-17 VITALS — BP 130/64 | HR 77 | Temp 98.1°F | Resp 20 | Ht 63.0 in | Wt 173.6 lb

## 2012-08-17 DIAGNOSIS — Z1231 Encounter for screening mammogram for malignant neoplasm of breast: Secondary | ICD-10-CM

## 2012-08-17 DIAGNOSIS — Z79899 Other long term (current) drug therapy: Secondary | ICD-10-CM

## 2012-08-17 DIAGNOSIS — E119 Type 2 diabetes mellitus without complications: Secondary | ICD-10-CM | POA: Diagnosis not present

## 2012-08-17 DIAGNOSIS — E785 Hyperlipidemia, unspecified: Secondary | ICD-10-CM

## 2012-08-17 DIAGNOSIS — M8580 Other specified disorders of bone density and structure, unspecified site: Secondary | ICD-10-CM

## 2012-08-17 DIAGNOSIS — M899 Disorder of bone, unspecified: Secondary | ICD-10-CM

## 2012-08-17 DIAGNOSIS — B372 Candidiasis of skin and nail: Secondary | ICD-10-CM | POA: Diagnosis not present

## 2012-08-17 DIAGNOSIS — Z0000 Encounter for general adult medical examination without abnormal findings: Secondary | ICD-10-CM

## 2012-08-17 LAB — LIPID PANEL
Cholesterol: 202 mg/dL — ABNORMAL HIGH (ref 0–200)
HDL: 42 mg/dL (ref >39)
LDL Cholesterol: 110 mg/dL — ABNORMAL HIGH (ref 0–99)
Total CHOL/HDL Ratio: 4.8 Ratio
Triglycerides: 250 mg/dL — ABNORMAL HIGH (ref <150)
VLDL: 50 mg/dL — ABNORMAL HIGH (ref 0–40)

## 2012-08-17 LAB — COMPREHENSIVE METABOLIC PANEL
ALT: 13 U/L (ref 0–35)
AST: 15 U/L (ref 0–37)
Albumin: 4.4 g/dL (ref 3.5–5.2)
Alkaline Phosphatase: 61 U/L (ref 39–117)
BUN: 15 mg/dL (ref 6–23)
CO2: 23 mEq/L (ref 19–32)
Calcium: 9.6 mg/dL (ref 8.4–10.5)
Chloride: 100 mEq/L (ref 96–112)
Creat: 0.83 mg/dL (ref 0.50–1.10)
Glucose, Bld: 125 mg/dL — ABNORMAL HIGH (ref 70–99)
Potassium: 4.5 mEq/L (ref 3.5–5.3)
Sodium: 132 mEq/L — ABNORMAL LOW (ref 135–145)
Total Bilirubin: 0.8 mg/dL (ref 0.3–1.2)
Total Protein: 7.3 g/dL (ref 6.0–8.3)

## 2012-08-17 LAB — POCT GLYCOSYLATED HEMOGLOBIN (HGB A1C): Hemoglobin A1C: 6.4

## 2012-08-17 MED ORDER — LISINOPRIL 5 MG PO TABS: 5.0000 mg | ORAL_TABLET | Freq: Every day | ORAL | Status: DC

## 2012-08-17 MED ORDER — NYSTATIN 100000 UNIT/GM EX POWD: Freq: Four times a day (QID) | CUTANEOUS | Status: DC

## 2012-08-17 MED ORDER — METFORMIN HCL 500 MG PO TABS: 500.0000 mg | ORAL_TABLET | Freq: Two times a day (BID) | ORAL | Status: DC

## 2012-08-17 NOTE — Progress Notes (Signed)
  Subjective:    Patient ID: Catherine Ellis, female    DOB: [birth year], 79 y.o.   MRN: [id number] Chief Complaint  Patient presents with  . Diabetes  . Hypertension  . Hyperlipidemia  . Follow-up    bump on right side of chest    HPI  Taking meds. Has macular degeneration and has optho exam yearly. Takes an asa occasionally - will start every day.      Review of Systems    BP 130/64  Pulse 77  Temp(Src) 98.1 F (36.7 C) (Oral)  Resp 20  Ht 5\' 3"  (1.6 m)  Wt 173 lb 9.6 oz (78.744 kg)  BMI 30.76 kg/m2  SpO2 97% Objective:   Physical Exam          Results for orders placed in visit on 08/17/12  POCT GLYCOSYLATED HEMOGLOBIN (HGB A1C)      Component Value Range   Hemoglobin A1C 6.4      Assessment & Plan:  Skin candidiasis - Plan: nystatin (MYCOSTATIN) powder  Type II or unspecified type diabetes mellitus without mention of complication, not stated as uncontrolled - Plan: POCT glycosylated hemoglobin (Hb A1C)  Other and unspecified hyperlipidemia - Plan: Lipid panel  Encounter for long-term (current) use of other medications - Plan: Comprehensive metabolic panel  DM type 2 (diabetes mellitus, type 2) - Plan: metFORMIN (GLUCOPHAGE) 500 MG tablet, lisinopril (PRINIVIL,ZESTRIL) 5 MG tablet  Diabetes mellitus  Routine general medical examination at a health care facility  Osteopenia - Plan: DG Bone Density  Other screening mammogram - Plan: MM Digital Screening  Meds ordered this encounter  Medications  . OVER THE COUNTER MEDICATION    Sig: Desitin using prn rash  . OVER THE COUNTER MEDICATION    Sig: Mucinex DM prn cough  .  nystatin (MYCOSTATIN) powder    Sig: Apply topically 4 (four) times daily.    Dispense:  60 g    Refill:  5  . metFORMIN (GLUCOPHAGE) 500 MG tablet    Sig: Take 1 tablet (500 mg total) by mouth 2 (two) times daily with a meal.    Dispense:  180 tablet    Refill:  2  . lisinopril (PRINIVIL,ZESTRIL) 5 MG tablet    Sig: Take 1 tablet  (5 mg total) by mouth daily.    Dispense:  90 tablet    Refill:  3

## 2012-08-17 NOTE — Patient Instructions (Addendum)
Diabetes, Eating Away From Home Sometimes, you might eat in a restaurant or have meals that are prepared by someone else. You can enjoy eating out. However, the portions in restaurants may be much larger than needed. Listed below are some ideas to help you choose foods that will keep your blood glucose (sugar) in better control.  TIPS FOR EATING OUT  Know your meal plan and how many carbohydrate servings you should have at each meal. You may wish to carry a copy of your meal plan in your purse or wallet. Learn the foods included in each food group.  Make a list of restaurants near you that offer healthy choices. Take a copy of the carry-out menus to see what they offer. Then, you can plan what you will order ahead of time.  Become familiar with serving sizes by practicing them at home using measuring cups and spoons. Once you learn to recognize portion sizes, you will be able to correctly estimate the amount of total carbohydrate you are allowed to eat at the restaurant. Ask for a takeout box if the portion is more than you should have. When your food comes, leave the amount you should have on the plate, and put the rest in the takeout box before you start eating.  Plan ahead if your mealtime will be different from usual. Check with your caregiver to find out how to time meals and medicine if you are taking insulin.  Avoid high-fat foods, such as fried foods, cream sauces, high-fat salad dressings, or any added butter or margarine.  Do not be afraid to ask questions. Ask your server about the portion size, cooking methods, ingredients and if items can be substituted. Restaurants do not list all available items on the menu. You can ask for your main entree to be prepared using skim milk, oil instead of butter or margarine, and without gravy or sauces. Ask your waiter or waitress to serve salad dressings, gravy, sauces, margarine, and sour cream on the side. You can then add the amount your meal plan  suggests.  Add more vegetables whenever possible.  Avoid items that are labeled "jumbo," "giant," "deluxe," or "supersized."  You may want to split an entre with someone and order an extra side salad.  Watch for hidden calories in foods like croutons, bacon, or cheese.  Ask your server to take away the bread basket or chips from your table.  Order a dinner salad as an appetizer. You can eat most foods served in a restaurant. Some foods are better choices than others. Breads and Starches  Recommended: All kinds of bread (wheat, rye, white, oatmeal, Italian, French, raisin), hard or soft dinner rolls, frankfurter or hamburger buns, small bagels, small corn or whole-wheat flour tortillas.  Avoid: Frosted or glazed breads, butter rolls, egg or cheese breads, croissants, sweet rolls, pastries, coffee cake, glazed or frosted doughnuts, muffins. Crackers  Recommended: Animal crackers, graham, rye, saltine, oyster, and matzoth crackers. Bread sticks, melba toast, rusks, pretzels, popcorn (without fat), zwieback toast.  Avoid: High-fat snack crackers or chips. Buttered popcorn. Cereals  Recommended: Hot and cold cereals. Whole grains such as oatmeal or shredded wheat are good choices.  Avoid: Sugar-coated or granola type cereals. Potatoes/Pasta/Rice/Beans  Recommended: Order baked, boiled, or mashed potatoes, rice or noodles without added fat, whole beans. Order gravies, butter, margarine, or sauces on the side so you can control the amount you add.  Avoid: Hash browns or fried potatoes. Potatoes, pasta, or rice prepared with cream or   cheese sauce. Potato or pasta salads prepared with large amounts of dressing. Fried beans or fried rice. Vegetables  Recommended: Order steamed, baked, boiled, or stewed vegetables without sauces or extra fat. Ask that sauce be served on the side. If vegetables are not listed on the menu, ask what is available.  Avoid: Vegetables prepared with cream,  butter, or cheese sauce. Fried vegetables. Salad Bars  Recommended: Many of the vegetables at a salad bar are considered "free." Use lemon juice, vinegar, or low-calorie salad dressing (fewer than 20 calories per serving) as "free" dressings for your salad. Look for salad bar ingredients that have no added fat or sugar such as tomatoes, lettuce, cucumbers, broccoli, carrots, onions, and mushrooms.  Avoid: Prepared salads with large amounts of dressing, such as coleslaw, caesar salad, macaroni salad, bean salad, or carrot salad. Fruit  Recommended: Eat fresh fruit or fresh fruit salad without added dressing. A salad bar often offers fresh fruit choices, but canned fruit at a restaurant is usually packed in sugar or syrup.  Avoid: Sweetened canned or frozen fruits, plain or sweetened fruit juice. Fruit salads with dressing, sour cream, or sugar added to them. Meat and Meat Substitutes  Recommended: Order broiled, baked, roasted, or grilled meat, poultry, or fish. Trim off all visible fat. Do not eat the skin of poultry. The size stated on the menu is the raw weight. Meat shrinks by  in cooking (for example, 4 oz raw equals 3 oz cooked meat).  Avoid: Deep-fat fried meat, poultry, or fish. Breaded meats. Eggs  Recommended: Order soft, hard-cooked, poached, or scrambled eggs. Omelets may be okay, depending on what ingredients are added. Egg substitutes are also a good choice.  Avoid: Fried eggs, eggs prepared with cream or cheese sauce. Milk  Recommended: Order low-fat or fat-free milk according to your meal plan. Plain, nonfat yogurt or flavored yogurt with no sugar added may be used as a substitute for milk. Soy milk may also be used.  Avoid: Milk shakes or sweetened milk beverages. Soups and Combination Foods  Recommended: Clear broth or consomm are "free" foods and may be used as an appetizer. Broth-based soups with fat removed count as a starch serving and are preferred over cream  soups. Soups made with beans or split peas may be eaten but count as a starch.  Avoid: Fatty soups, soup made with cream, cheese soup. Combination foods prepared with excessive amounts of fat or with cream or cheese sauces. Desserts and Sweets  Recommended: Ask for fresh fruit. Sponge or angel food cake without icing, ice milk, no sugar added ice cream, sherbet, or frozen yogurt may fit into your meal plan occasionally.  Avoid: Pastries, puddings, pies, cakes with icing, custard, gelatin desserts. Fats and Oils  Recommended: Choose healthy fats such as olive oil, canola oil, or tub margarine, reduced fat or fat-free sour cream, cream cheese, avocado, or nuts.  Avoid: Any fats in excess of your allowed portion. Deep-fried foods or any food with a large amount of fat. Note: Ask for all fats to be served on the side, and limit your portion sizes according to your meal plan. Document Released: 06/27/2005 Document Revised: 09/19/2011 Document Reviewed: 01/15/2009 [facility] Patient Information 2013 [place], [place].   Diabetes and Standards of Medical Care  Diabetes is complicated. You may find that your diabetes team includes a dietitian, nurse, diabetes educator, eye doctor, and more. To help everyone know what is going on and to help you get the care you deserve, the  following schedule of care was developed to help keep you on track. Below are the tests, exams, vaccines, medicines, education, and plans you will need. A1c test  Performed at least 2 times a year if you are meeting treatment goals.  Performed 4 times a year if therapy has changed or if you are not meeting therapy/glycemic goals. Aspirin medicine  Take daily as directed by your caregiver. Blood pressure test  Performed at every routine medical visit. The goal is less than 130/80 mm/Hg. Dental exam  Get a dental exam at least 2 times a year. Dilated eye exam (retinal exam)  Type 1 diabetes: Get an exam within 5 years of  diagnosis and then yearly.  Type 2 diabetes: Get an exam at diagnosis and then yearly. All exams thereafter can be extended to every 2 to 3 years if one or more exams have been normal. Foot care exam  Visual foot exams are performed at every routine medical visit. The exams check for cuts, injuries, or other problems with the feet.  A comprehensive foot exam should be done yearly. This includes visual inspection as well as assessing foot pulses and testing for loss of sensation. Kidney function test (urine microalbumin)  Performed once a year.  Type 1 diabetes: The first test is performed 5 years after diagnosis.  Type 2 diabetes: The first test is performed at the time of diagnosis.  A serum creatinine and estimated glomerular filtration rate (eGFR) test is done once a year to tell the level of chronic kidney disease (CKD), if present. Lipid profile (Cholesterol, HDL, LDL, Triglycerides)  Performed once a year for most people. If at low risk, may be assessed every 2 years.  The goal for LDL is less than 100 mg/dl. If at high risk, the goal is less than 70 mg/dl.  The goal for HDL is higher than 40 mg/dl for men and higher than 50 mg/dl for women.  The goal for triglycerides is less than 150 mg/dl. Flu vaccine, pneumonia vaccine, and hepatitis B vaccine  The flu vaccine is recommended yearly.  The pneumonia vaccine is generally given once in a lifetime. However, there are some instances where another vaccine is recommended. Check with your caregiver.  The hepatitis B vaccine is also recommended for adults with diabetes. Diabetes self-management education  Recommended at diagnosis and ongoing as needed. Treatment plan  Reviewed at every medical visit. Document Released: 04/24/2009 Document Revised: 09/19/2011 Document Reviewed: 12/28/2010 [facility] Patient Information 2013 [place], [place].

## 2012-09-26 ENCOUNTER — Ambulatory Visit
Admission: RE | Admit: 2012-09-26 | Discharge: 2012-09-26 | Disposition: A | Discharge status: Home / Self Care | Payer: Medicare Other | Source: Ambulatory Visit | Attending: Family Medicine | Admitting: Family Medicine

## 2012-09-26 DIAGNOSIS — Z1231 Encounter for screening mammogram for malignant neoplasm of breast: Secondary | ICD-10-CM | POA: Diagnosis not present

## 2012-09-26 DIAGNOSIS — M949 Disorder of cartilage, unspecified: Secondary | ICD-10-CM | POA: Diagnosis not present

## 2012-09-26 DIAGNOSIS — M8580 Other specified disorders of bone density and structure, unspecified site: Secondary | ICD-10-CM

## 2012-09-26 DIAGNOSIS — M899 Disorder of bone, unspecified: Secondary | ICD-10-CM | POA: Diagnosis not present

## 2012-09-26 HISTORY — DX: Other specified disorders of bone density and structure, unspecified site: M85.80

## 2012-09-28 ENCOUNTER — Telehealth: Payer: Self-pay | Admitting: Radiology

## 2012-09-28 ENCOUNTER — Other Ambulatory Visit: Payer: Self-pay | Admitting: Family Medicine

## 2012-09-28 DIAGNOSIS — R928 Other abnormal and inconclusive findings on diagnostic imaging of breast: Secondary | ICD-10-CM

## 2012-09-28 DIAGNOSIS — R921 Mammographic calcification found on diagnostic imaging of breast: Secondary | ICD-10-CM

## 2012-09-28 NOTE — Telephone Encounter (Signed)
Message copied by Caffie Damme on Fri Sep 28, 2012  4:37 PM ------      Message from: Zenovia Jarred      Created: Fri Sep 28, 2012  3:14 PM      Regarding: Diag       Bil calcs            diag Ltd bil  ------

## 2012-10-02 ENCOUNTER — Ambulatory Visit
Admission: RE | Admit: 2012-10-02 | Discharge: 2012-10-02 | Disposition: A | Discharge status: Home / Self Care | Payer: Medicare Other | Source: Ambulatory Visit | Attending: Family Medicine | Admitting: Family Medicine

## 2012-10-02 DIAGNOSIS — R921 Mammographic calcification found on diagnostic imaging of breast: Secondary | ICD-10-CM

## 2012-10-02 DIAGNOSIS — R928 Other abnormal and inconclusive findings on diagnostic imaging of breast: Secondary | ICD-10-CM | POA: Diagnosis not present

## 2012-10-11 ENCOUNTER — Telehealth: Payer: Self-pay

## 2012-10-11 NOTE — Telephone Encounter (Signed)
PT STATES SHE HAVE A YEAST INFECTION AND EVEN THOUGH SHE PUTS POWDER ON IT, IS STILL VERY UNCOMFORTABLE. WOULD LIKE TO HAVE SOMETHING CALLED IN FOR IT. PLEASE CALL [phone]   [facility]

## 2012-10-13 ENCOUNTER — Telehealth: Payer: Self-pay

## 2012-10-13 NOTE — Telephone Encounter (Signed)
Pt was seen by Dr. Clelia Croft she is needing her to call her back.

## 2012-10-13 NOTE — Telephone Encounter (Signed)
At our last visit on 2/7, she had nystatin sent in for her with 4 refills on it. Is she using this and it isn't working? Is she keeping the area very dry and as free of friction and moisture as possible? If this is not working, I'm concerned she could have something other than yeast infection so should come to [facility] for eval.

## 2012-10-13 NOTE — Telephone Encounter (Signed)
lmom to cb. 

## 2012-10-15 NOTE — Telephone Encounter (Signed)
Left another message for her to call back.  

## 2012-10-15 NOTE — Telephone Encounter (Signed)
Called her, left message for her to call me back.  

## 2012-10-16 NOTE — Telephone Encounter (Signed)
Unable to reach, multiple attempts, letter sent

## 2012-10-17 ENCOUNTER — Telehealth: Payer: Self-pay | Admitting: Radiology

## 2012-10-17 NOTE — Telephone Encounter (Signed)
PATIENT'S CAREGIVER, SANTANA FROM [facility], IS CALLING BECAUSE PATIENT HAS A FUNGAL RASH IN HER GROIN AREA AND WOULD LIKE TO KNOW IF SOMETHING CAN BE PRESCRIBED FOR THAT. PLEASE CALL SANTANA AT [phone]. OR MS Speckman CAN BE REACHED AT [phone].

## 2012-10-17 NOTE — Telephone Encounter (Signed)
I spoke to patient advised to keep dry, avoid friction, return to clinic if not improving. Patient agrees to plan

## 2012-10-20 NOTE — Telephone Encounter (Signed)
Pt called and needs prescription for rash in groin area. Pt sees Dr Clelia Croft. [employer] [facility] Please let pt know when done. If no answer please try pt again. Resident of [facility] and not  Always in her room

## 2012-10-21 NOTE — Telephone Encounter (Signed)
Dr Shaw please advise. 

## 2012-10-22 ENCOUNTER — Telehealth: Payer: Self-pay | Admitting: Radiology

## 2012-10-22 ENCOUNTER — Telehealth: Payer: Self-pay

## 2012-10-22 NOTE — Telephone Encounter (Signed)
She has been advised of labs, Ginger called her on 08/19/12.Your cholesterol is MUCH better but still not at goal - the real problem is that your triglycerides - or fats in your blood - are much to high. There are additional medications that we could start to treat this but alternatively, if you are not supplementing with fish oil, flax seed, or niacin - some or all of these could help a lot w/o having to add in another prescription medication. Taking 2 tabs of fish oil twice a day might be just the thing you need. Lets recheck at your next visit and if not better then, then we can discuss whether you want to start a new medication in addition to your current.   Left message for her to call me back, if she still has questions, but labs have been reviewed.

## 2012-10-22 NOTE — Telephone Encounter (Signed)
[facility] states Catherine Ellis needs call back from [place] in regard to her labs.   Best phone for pt is [phone]

## 2012-10-22 NOTE — Telephone Encounter (Signed)
Pt called again, states has ? But not about labs,called her. She is asking about the rash. She is advised again to return to clinic for this, she is transferred for appt scheduling.

## 2012-10-23 ENCOUNTER — Other Ambulatory Visit: Payer: Self-pay | Admitting: Family Medicine

## 2012-10-23 DIAGNOSIS — B372 Candidiasis of skin and nail: Secondary | ICD-10-CM

## 2012-10-23 MED ORDER — NYSTATIN 100000 UNIT/GM EX POWD: Freq: Four times a day (QID) | CUTANEOUS | Status: DC

## 2012-10-23 NOTE — Telephone Encounter (Signed)
Thank you, she has called multiple times, I have advised her multiple times to return, she continues to call with same questions, have also sent letter. She seems to be very anxious about the rash. I explained the concerns, and the need for possible skin scraping/etc. She seems to understand, but then calls back again to ask the same questions. i have also advised nurse who takes care of her. To you fYI

## 2012-10-23 NOTE — Telephone Encounter (Signed)
Thanks, also sent refill of nystatin to her pharmacy in case she calls again.

## 2012-10-23 NOTE — Telephone Encounter (Signed)
Pt has 5 phone messages about this in the past wk. My prev response was as copied below but looks like we were unable to reach pt with this info so letter was sent.  Our last visit on 2/7, she had nystatin sent in for her with 4 refills on it. Is she using this and it isn't working? Is she keeping the area very dry and as free of friction and moisture as possible? If this is not working, I'm concerned she could have something other than yeast infection so should come to [facility] for eval.

## 2012-10-26 ENCOUNTER — Ambulatory Visit (INDEPENDENT_AMBULATORY_CARE_PROVIDER_SITE_OTHER): Payer: Medicare Other | Admitting: Family Medicine

## 2012-10-26 VITALS — BP 116/78 | HR 82 | Temp 97.5°F | Resp 16 | Ht 63.0 in | Wt 173.4 lb

## 2012-10-26 DIAGNOSIS — K429 Umbilical hernia without obstruction or gangrene: Secondary | ICD-10-CM

## 2012-10-26 DIAGNOSIS — B372 Candidiasis of skin and nail: Secondary | ICD-10-CM

## 2012-10-26 MED ORDER — FLUCONAZOLE 150 MG PO TABS: ORAL_TABLET | ORAL | Status: DC

## 2012-10-28 NOTE — Progress Notes (Signed)
Subjective:    Patient ID: Catherine Ellis, female    DOB: August 13, 1921, 79 y.o.   MRN: [id number]  HPI  At our last visit about 2 months ago, pt was c/o an itchy rash over her perineum - looked to be a yeast dermatitis - tinea cruris - so I rx'ed her some top nystatin.  She never filled it and was just using some otc cream but the rash continued to worsen so she called the pharmacy but they said they didn't have anything on file for her.  She was evaluated by the nurse at [facility] who suggested she keep it very dry and use a hair dryer to do so which pt had but the rash worsened and she started to get a little bleeding on the toilet paper after whiping.  She had called here sev times about the rash so I resent the nystatin powder and she got it several days ago.  The rash is already starting to improve w/ that.  She had started on a fish oil supp as I recommended for her cholesterol prior to the rash worsening and she has stopped that as she was worried that it triggered it - people kept asking her if she had started any new medications.  Many years ago she had a surgery eval to repair her umbilical hernia and was informed that she was welcome to have this done.  However, family members talked her out of it - said that most people just live with it - and now she is wondering if it will cause any problems - if she should have it fixed before she gets older and is at higher surgical risk. Is not painful at all, does not bother her. It is difficult to keep clean though she hasn't really tried - will occ pick black stuff out of it. Has not appreciably changed in size overtime.  Past Medical History  Diagnosis Date  . Arthritis   . Diabetes mellitus   . Hypertension    Current Outpatient Prescriptions on File Prior to Visit  Medication Sig Dispense Refill  . lisinopril (PRINIVIL,ZESTRIL) 5 MG tablet Take 1 tablet (5 mg total) by mouth daily.  90 tablet  3  . metFORMIN (GLUCOPHAGE) 500 MG tablet  Take 1 tablet (500 mg total) by mouth 2 (two) times daily with a meal.  180 tablet  2  . nystatin (MYCOSTATIN) powder Apply topically 4 (four) times daily.  60 g  5  . OVER THE COUNTER MEDICATION Mucinex DM prn cough      . OVER THE COUNTER MEDICATION Desitin using prn rash      . pravastatin (PRAVACHOL) 40 MG tablet Take 1 tablet (40 mg total) by mouth daily.  90 tablet  1   No current facility-administered medications on file prior to visit.   No Known Allergies  Review of Systems  Constitutional: Negative for fever, chills and diaphoresis.  Gastrointestinal: Positive for abdominal distention and rectal pain. Negative for nausea, vomiting, abdominal pain and blood in stool.  Genitourinary: Positive for genital sores and pelvic pain. Negative for hematuria, vaginal bleeding and vaginal discharge.  Musculoskeletal: Positive for arthralgias. Negative for joint swelling.  Skin: Positive for color change and rash. Negative for pallor and wound.  Hematological: Negative for adenopathy.      BP 116/78  Pulse 82  Temp(Src) 97.5 F (36.4 C) (Oral)  Resp 16  Ht 5\' 3"  (1.6 m)  Wt 173 lb 6.4 oz (78.654 kg)  BMI 30.72 kg/m2  SpO2 95% Objective:   Physical Exam  Constitutional: She is oriented to person, place, and time. She appears well-developed and well-nourished. No distress.  HENT:  Head: Normocephalic and atraumatic.  Right Ear: External ear normal.  Left Ear: External ear normal.  Eyes: Conjunctivae are normal. No scleral icterus.  Neck: Normal range of motion. Neck supple. No thyromegaly present.  Cardiovascular: Normal rate, regular rhythm, normal heart sounds and intact distal pulses.   Pulmonary/Chest: Effort normal and breath sounds normal. No respiratory distress.  Abdominal: Soft. Normal appearance and bowel sounds are normal. She exhibits no pulsatile midline mass. There is no tenderness. A hernia is present. Hernia confirmed positive in the ventral area.  Hernia is midline  directly behind umbilicus and extending into the diastasis recti superior and inferior w/ umbilicus being stretched to approx 2cm dm.  Genitourinary: There is rash and tenderness on the right labia. There is no lesion or injury on the right labia. There is rash and tenderness on the left labia. There is no lesion or injury on the left labia.  Musculoskeletal: She exhibits no edema.  Lymphadenopathy:    She has no cervical adenopathy.  Neurological: She is alert and oriented to person, place, and time.  Skin: Skin is warm and dry. Rash noted. Rash is macular. She is not diaphoretic. There is erythema.  erythema along inguinal folds and perineum, painful, raw  ubmilical hernia w/ thin, red, flaking skin  Psychiatric: She has a normal mood and affect. Her behavior is normal.      Assessment & Plan:  Yeast dermatitis - will do oral antifungal as well as continue nystatin due to the severity and worsening over 2-3 mos since was not properly treated.  Keep very dry.  Umbilical hernia - benign, likely won't cause a problem as sizeable enough to be very unlikely to incarcerate and strangulate bowel.  My only concern is potential infection as skin is very thin and a copious amount of keratinized skin/dirt removed today in clinic w/ pick-ups.  Start cleaning daily with wet wipes, then make sure skin is very dry, esp in folds and apply top nystatin powder  HPL - restart fish oil supp as last trig 250, hdl 42 and non-hdl chol 30 above goal.  Goal LDL <100 and pt's was 110 on pravastatin  Meds ordered this encounter  Medications  . fluconazole (DIFLUCAN) 150 MG tablet    Sig: Take 1 tab po q3d until gone    Dispense:  5 tablet    Refill:  0

## 2012-10-30 ENCOUNTER — Encounter (HOSPITAL_COMMUNITY): Payer: Self-pay | Admitting: Emergency Medicine

## 2012-10-30 ENCOUNTER — Emergency Department (HOSPITAL_COMMUNITY)
Admission: EM | Admit: 2012-10-30 | Discharge: 2012-10-30 | Disposition: A | Discharge status: Home / Self Care | Payer: Medicare Other | Attending: Emergency Medicine | Admitting: Emergency Medicine

## 2012-10-30 ENCOUNTER — Emergency Department (HOSPITAL_COMMUNITY): Payer: Medicare Other

## 2012-10-30 DIAGNOSIS — R404 Transient alteration of awareness: Secondary | ICD-10-CM | POA: Diagnosis not present

## 2012-10-30 DIAGNOSIS — E119 Type 2 diabetes mellitus without complications: Secondary | ICD-10-CM | POA: Diagnosis not present

## 2012-10-30 DIAGNOSIS — M129 Arthropathy, unspecified: Secondary | ICD-10-CM | POA: Insufficient documentation

## 2012-10-30 DIAGNOSIS — J019 Acute sinusitis, unspecified: Secondary | ICD-10-CM | POA: Insufficient documentation

## 2012-10-30 DIAGNOSIS — I10 Essential (primary) hypertension: Secondary | ICD-10-CM | POA: Diagnosis not present

## 2012-10-30 DIAGNOSIS — Z79899 Other long term (current) drug therapy: Secondary | ICD-10-CM | POA: Insufficient documentation

## 2012-10-30 DIAGNOSIS — R42 Dizziness and giddiness: Secondary | ICD-10-CM | POA: Diagnosis not present

## 2012-10-30 DIAGNOSIS — R51 Headache: Secondary | ICD-10-CM | POA: Diagnosis not present

## 2012-10-30 DIAGNOSIS — J329 Chronic sinusitis, unspecified: Secondary | ICD-10-CM

## 2012-10-30 LAB — BASIC METABOLIC PANEL
BUN: 11 mg/dL (ref 6–23)
CO2: 25 mEq/L (ref 19–32)
Calcium: 9.6 mg/dL (ref 8.4–10.5)
Chloride: 105 mEq/L (ref 96–112)
Creatinine, Ser: 0.71 mg/dL (ref 0.50–1.10)
GFR calc Af Amer: 86 mL/min — ABNORMAL LOW (ref >90)
GFR calc non Af Amer: 74 mL/min — ABNORMAL LOW (ref >90)
Glucose, Bld: 110 mg/dL — ABNORMAL HIGH (ref 70–99)
Potassium: 4.2 mEq/L (ref 3.5–5.1)
Sodium: 141 mEq/L (ref 135–145)

## 2012-10-30 LAB — URINALYSIS, ROUTINE W REFLEX MICROSCOPIC
Bilirubin Urine: NEGATIVE
Glucose, UA: NEGATIVE mg/dL
Ketones, ur: NEGATIVE mg/dL
Leukocytes, UA: NEGATIVE
Nitrite: NEGATIVE
Protein, ur: NEGATIVE mg/dL
Specific Gravity, Urine: 1.009 (ref 1.005–1.030)
Urobilinogen, UA: 0.2 mg/dL (ref 0.0–1.0)
pH: 5.5 (ref 5.0–8.0)

## 2012-10-30 LAB — URINE MICROSCOPIC-ADD ON

## 2012-10-30 LAB — CBC WITH DIFFERENTIAL/PLATELET
Basophils Absolute: 0.1 10*3/uL (ref 0.0–0.1)
Basophils Relative: 1 % (ref 0–1)
Eosinophils Absolute: 0.1 10*3/uL (ref 0.0–0.7)
Eosinophils Relative: 1 % (ref 0–5)
HCT: 42.5 % (ref 36.0–46.0)
Hemoglobin: 14.5 g/dL (ref 12.0–15.0)
Lymphocytes Relative: 40 % (ref 12–46)
Lymphs Abs: 3.7 10*3/uL (ref 0.7–4.0)
MCH: 30.3 pg (ref 26.0–34.0)
MCHC: 34.1 g/dL (ref 30.0–36.0)
MCV: 88.7 fL (ref 78.0–100.0)
Monocytes Absolute: 0.8 10*3/uL (ref 0.1–1.0)
Monocytes Relative: 9 % (ref 3–12)
Neutro Abs: 4.6 10*3/uL (ref 1.7–7.7)
Neutrophils Relative %: 50 % (ref 43–77)
Platelets: 351 10*3/uL (ref 150–400)
RBC: 4.79 MIL/uL (ref 3.87–5.11)
RDW: 13.4 % (ref 11.5–15.5)
WBC: 9.3 10*3/uL (ref 4.0–10.5)

## 2012-10-30 LAB — TROPONIN I: Troponin I: <0.3 ng/mL (ref <0.30)

## 2012-10-30 MED ORDER — FLUTICASONE PROPIONATE 50 MCG/ACT NA SUSP: 2.0000 | Freq: Every day | NASAL | Status: DC

## 2012-10-30 MED ORDER — AZITHROMYCIN 250 MG PO TABS: ORAL_TABLET | ORAL | Status: DC

## 2012-10-30 MED ORDER — ACETAMINOPHEN 325 MG PO TABS
650.0000 mg | ORAL_TABLET | Freq: Once | ORAL | Status: AC
  Administered 2012-10-30: 650 mg via ORAL
  Filled 2012-10-30: qty 2

## 2012-10-30 NOTE — ED Provider Notes (Signed)
History     CSN: [id number]  Arrival date & time 10/30/12  1452   First MD Initiated Contact with Patient 10/30/12 1458      Chief Complaint  Patient presents with  . Hypertension    (Consider location/radiation/quality/duration/timing/severity/associated sxs/prior treatment) HPI Comments: Comes to the ER for evaluation of elevated blood pressure. Patient reports that she was doing volunteer work at the scene [employer] and felt "heaviness" in her head and slight dizziness. Symptoms persisted and she presented to the nurse who found her blood pressure be very elevated. Blood pressure was 194/100. Patient reports that she does have a history of elevated blood pressure, but previously fairly well controlled with her medications. Patient has not experienced any blurred vision. She is not expressing any chest pain, palpitations or shortness of breath. She says that she has been diagnosed with a yeast infection by her primary doctor and took Diflucan for this. She's not sure if this caused her symptoms today.  Patient is a 79 y.o. female presenting with hypertension.  Hypertension Pertinent negatives include no chest pain and no shortness of breath.    Past Medical History  Diagnosis Date  . Arthritis   . Diabetes mellitus   . Hypertension     Past Surgical History  Procedure Laterality Date  . Back surgery      1965  . Knee surgery    . Appendectomy      No family history on file.  History  Substance Use Topics  . Smoking status: Never Smoker   . Smokeless tobacco: Not on file  . Alcohol Use: No    OB History   Grav Para Term Preterm Abortions TAB SAB Ect Mult Living                  Review of Systems  Constitutional: Negative for fever.  Respiratory: Negative for shortness of breath.   Cardiovascular: Negative for chest pain.  Neurological: Positive for dizziness.  All other systems reviewed and are negative.    Allergies  Review of patient's allergies  indicates no known allergies.  Home Medications   Current Outpatient Rx  Name  Route  Sig  Dispense  Refill  . fluconazole (DIFLUCAN) 150 MG tablet      Take 1 tab po q3d until gone   5 tablet   0   . lisinopril (PRINIVIL,ZESTRIL) 5 MG tablet   Oral   Take 1 tablet (5 mg total) by mouth daily.   90 tablet   3   . metFORMIN (GLUCOPHAGE) 500 MG tablet   Oral   Take 1 tablet (500 mg total) by mouth 2 (two) times daily with a meal.   180 tablet   2   . nystatin (MYCOSTATIN) powder   Topical   Apply topically 4 (four) times daily.   60 g   5   . OVER THE COUNTER MEDICATION      Desitin using prn rash         . OVER THE COUNTER MEDICATION      Mucinex DM prn cough         . pravastatin (PRAVACHOL) 40 MG tablet   Oral   Take 1 tablet (40 mg total) by mouth daily.   90 tablet   1     SpO2 96%  Physical Exam  Constitutional: She is oriented to person, place, and time. She appears well-developed and well-nourished. No distress.  HENT:  Head: Normocephalic and atraumatic.  Right Ear:  Hearing normal.  Nose: Nose normal.  Mouth/Throat: Oropharynx is clear and moist and mucous membranes are normal.  Eyes: Conjunctivae and EOM are normal. Pupils are equal, round, and reactive to light.  Neck: Normal range of motion. Neck supple.  Cardiovascular: Normal rate, regular rhythm, S1 normal and S2 normal.  Exam reveals no gallop and no friction rub.   No murmur heard. Pulmonary/Chest: Effort normal and breath sounds normal. No respiratory distress. She exhibits no tenderness.  Abdominal: Soft. Normal appearance and bowel sounds are normal. There is no hepatosplenomegaly. There is no tenderness. There is no rebound, no guarding, no tenderness at McBurney's point and negative Murphy's sign. No hernia.  Musculoskeletal: Normal range of motion.  Neurological: She is alert and oriented to person, place, and time. She has normal strength. No cranial nerve deficit or sensory  deficit. Coordination normal. GCS eye subscore is 4. GCS verbal subscore is 5. GCS motor subscore is 6.  Skin: Skin is warm, dry and intact. No rash noted. No cyanosis.  Psychiatric: She has a normal mood and affect. Her speech is normal and behavior is normal. Thought content normal.    ED Course  Procedures (including critical care time)  EKG:  Date: 10/30/2012  Rate: 78  Rhythm: normal sinus rhythm  QRS Axis: left  Intervals: normal  ST/T Wave abnormalities: normal  Conduction Disutrbances:none  Narrative Interpretation:   Old EKG Reviewed: unchanged    Labs Reviewed - No data to display Ct Head Wo Contrast  10/30/2012  *RADIOLOGY REPORT*  Clinical Data: 79 year old female with headache and dizziness.  CT HEAD WITHOUT CONTRAST  Technique:  Contiguous axial images were obtained from the base of the skull through the vertex without contrast.  Comparison: 06/12/2011 CT  Findings: Minimal generalized cerebral volume loss and chronic small vessel white matter ischemic changes noted.  No acute intracranial abnormalities are identified, including mass lesion or mass effect, hydrocephalus, extra-axial fluid collection, midline shift, hemorrhage, or acute infarction.  The visualized bony calvarium is unremarkable except for chronic left maxillary sinus mucoperiosteal thickening.  IMPRESSION: No evidence of acute intracranial abnormality.  Chronic left maxillary sinus disease.   Original Report Authenticated By: Harmon Pier, M.D.      Diagnosis: 1. Hypertension 2. Dizziness 2. Sinusitis    MDM  Patient was brought to the emergency department for evaluation of elevated blood pressure. Patient's blood pressure was 194/100. She does have a history of hypertension, takes Prinivil. Normally she is fairly well controlled. Her blood pressure has slowly improved without any further intervention. Most recent was 140/80. She continued to have a slight headache. Patient describes it as a heaviness over  the frontal part of her head. CAT scan does show evidence of sinusitis, no other acute abnormality. Neurologic examination was normal. Patient reassured, monitor blood pressure closely. Treat with Zithromax and Flonase. Followup with primary Dr.        Gilda Crease, MD 10/30/12 [phone]

## 2012-10-30 NOTE — ED Notes (Signed)
Pt c/o of HA..

## 2012-10-30 NOTE — ED Notes (Signed)
Per EMS: Pt was at [employer] working in [employer] when she began to feel faint and dizzy.  She ate a bran muffin and did not feel any better.  When EMS got there, her first BP was 194/100.  Hx of HTN.

## 2012-11-01 ENCOUNTER — Telehealth: Payer: Self-pay

## 2012-11-01 NOTE — Telephone Encounter (Signed)
Called [facility]. On 10/30/12 patient was sent out for headache and elevated BP evaluated in ER dx with sinus infection. BP continues to be 170/88 she is using Lisinopril 20 mg daily. Please advise. Return to clinic or adjust meds.

## 2012-11-01 NOTE — Telephone Encounter (Signed)
santana from [facility] calling to talk with nurse about patients blood pressure please call her at [phone]

## 2012-11-02 ENCOUNTER — Ambulatory Visit (INDEPENDENT_AMBULATORY_CARE_PROVIDER_SITE_OTHER): Payer: Medicare Other | Admitting: Family Medicine

## 2012-11-02 VITALS — BP 177/74 | HR 96 | Temp 97.9°F | Resp 18 | Ht 62.0 in | Wt 171.8 lb

## 2012-11-02 DIAGNOSIS — R51 Headache: Secondary | ICD-10-CM | POA: Diagnosis not present

## 2012-11-02 DIAGNOSIS — E119 Type 2 diabetes mellitus without complications: Secondary | ICD-10-CM

## 2012-11-02 DIAGNOSIS — R42 Dizziness and giddiness: Secondary | ICD-10-CM | POA: Diagnosis not present

## 2012-11-02 LAB — POCT CBC
Granulocyte percent: 50.8 %G (ref 37–80)
HCT, POC: 45 % (ref 37.7–47.9)
Hemoglobin: 14.3 g/dL (ref 12.2–16.2)
Lymph, poc: 3.8 — AB (ref 0.6–3.4)
MCH, POC: 29.6 pg (ref 27–31.2)
MCHC: 31.8 g/dL (ref 31.8–35.4)
MCV: 93.1 fL (ref 80–97)
MID (cbc): 1.1 — AB (ref 0–0.9)
MPV: 10 fL (ref 0–99.8)
POC Granulocyte: 5.1 (ref 2–6.9)
POC LYMPH PERCENT: 38 %L (ref 10–50)
POC MID %: 11.2 %M (ref 0–12)
Platelet Count, POC: 372 10*3/uL (ref 142–424)
RBC: 4.83 M/uL (ref 4.04–5.48)
RDW, POC: 14 %
WBC: 10.1 10*3/uL (ref 4.6–10.2)

## 2012-11-02 LAB — POCT SEDIMENTATION RATE: POCT SED RATE: 22 mm/hr (ref 0–22)

## 2012-11-02 LAB — POCT GLYCOSYLATED HEMOGLOBIN (HGB A1C): Hemoglobin A1C: 6.2

## 2012-11-02 MED ORDER — NEOMYCIN-POLYMYXIN-HC 3.5-10000-1 OT SOLN: 2.0000 [drp] | Freq: Every day | OTIC | Status: DC | PRN

## 2012-11-02 MED ORDER — FEXOFENADINE HCL 180 MG PO TABS: 180.0000 mg | ORAL_TABLET | Freq: Every day | ORAL | Status: DC

## 2012-11-02 MED ORDER — METFORMIN HCL 500 MG PO TABS: 500.0000 mg | ORAL_TABLET | Freq: Two times a day (BID) | ORAL | Status: DC

## 2012-11-02 NOTE — Patient Instructions (Signed)
Results for orders placed during the hospital encounter of 10/30/12  CBC WITH DIFFERENTIAL      Result Value Range   WBC 9.3  4.0 - 10.5 K/uL   RBC 4.79  3.87 - 5.11 MIL/uL   Hemoglobin 14.5  12.0 - 15.0 g/dL   HCT 16.1  09.6 - 04.5 %   MCV 88.7  78.0 - 100.0 fL   MCH 30.3  26.0 - 34.0 pg   MCHC 34.1  30.0 - 36.0 g/dL   RDW 40.9  81.1 - 91.4 %   Platelets 351  150 - 400 K/uL   Neutrophils Relative 50  43 - 77 %   Neutro Abs 4.6  1.7 - 7.7 K/uL   Lymphocytes Relative 40  12 - 46 %   Lymphs Abs 3.7  0.7 - 4.0 K/uL   Monocytes Relative 9  3 - 12 %   Monocytes Absolute 0.8  0.1 - 1.0 K/uL   Eosinophils Relative 1  0 - 5 %   Eosinophils Absolute 0.1  0.0 - 0.7 K/uL   Basophils Relative 1  0 - 1 %   Basophils Absolute 0.1  0.0 - 0.1 K/uL  BASIC METABOLIC PANEL      Result Value Range   Sodium 141  135 - 145 mEq/L   Potassium 4.2  3.5 - 5.1 mEq/L   Chloride 105  96 - 112 mEq/L   CO2 25  19 - 32 mEq/L   Glucose, Bld 110 (*) 70 - 99 mg/dL   BUN 11  6 - 23 mg/dL   Creatinine, Ser 7.82  0.50 - 1.10 mg/dL   Calcium 9.6  8.4 - 95.6 mg/dL   GFR calc non Af Amer 74 (*) >90 mL/min   GFR calc Af Amer 86 (*) >90 mL/min  TROPONIN I      Result Value Range   Troponin I <0.30  <0.30 ng/mL  URINALYSIS, ROUTINE W REFLEX MICROSCOPIC      Result Value Range   Color, Urine YELLOW  YELLOW   APPearance CLEAR  CLEAR   Specific Gravity, Urine 1.009  1.005 - 1.030   pH 5.5  5.0 - 8.0   Glucose, UA NEGATIVE  NEGATIVE mg/dL   Hgb urine dipstick TRACE (*) NEGATIVE   Bilirubin Urine NEGATIVE  NEGATIVE   Ketones, ur NEGATIVE  NEGATIVE mg/dL   Protein, ur NEGATIVE  NEGATIVE mg/dL   Urobilinogen, UA 0.2  0.0 - 1.0 mg/dL   Nitrite NEGATIVE  NEGATIVE   Leukocytes, UA NEGATIVE  NEGATIVE  URINE MICROSCOPIC-ADD ON      Result Value Range   Squamous Epithelial / LPF FEW (*) RARE   Bacteria, UA FEW (*) RARE   Clinical Data: 79 year old female with headache and dizziness.  CT HEAD WITHOUT CONTRAST    Technique: Contiguous axial images were obtained from the base of  the skull through the vertex without contrast.  Comparison: 06/12/2011 CT  Findings: Minimal generalized cerebral volume loss and chronic  small vessel white matter ischemic changes noted.  No acute intracranial abnormalities are identified, including mass  lesion or mass effect, hydrocephalus, extra-axial fluid collection,  midline shift, hemorrhage, or acute infarction.  The visualized bony calvarium is unremarkable except for chronic  left maxillary sinus mucoperiosteal thickening.  IMPRESSION:  No evidence of acute intracranial abnormality.  Chronic left maxillary sinus disease.  Original Report Authenticated By: Harmon Pier, M.D.  Sinusitis Sinusitis is redness, soreness, and swelling (inflammation) of the paranasal sinuses. Paranasal sinuses  are air pockets within the bones of your face (beneath the eyes, the middle of the forehead, or above the eyes). In healthy paranasal sinuses, mucus is able to drain out, and air is able to circulate through them by way of your nose. However, when your paranasal sinuses are inflamed, mucus and air can become trapped. This can allow bacteria and other germs to grow and cause infection. Sinusitis can develop quickly and last only a short time (acute) or continue over a long period (chronic). Sinusitis that lasts for more than 12 weeks is considered chronic.  CAUSES  Causes of sinusitis include:  Allergies.  Structural abnormalities, such as displacement of the cartilage that separates your nostrils (deviated septum), which can decrease the air flow through your nose and sinuses and affect sinus drainage.  Functional abnormalities, such as when the small hairs (cilia) that line your sinuses and help remove mucus do not work properly or are not present. SYMPTOMS  Symptoms of acute and chronic sinusitis are the same. The primary symptoms are pain and pressure around the affected  sinuses. Other symptoms include:  Upper toothache.  Earache.  Headache.  Bad breath.  Decreased sense of smell and taste.  A cough, which worsens when you are lying flat.  Fatigue.  Fever.  Thick drainage from your nose, which often is green and may contain pus (purulent).  Swelling and warmth over the affected sinuses. DIAGNOSIS  Your caregiver will perform a physical exam. During the exam, your caregiver may:  Look in your nose for signs of abnormal growths in your nostrils (nasal polyps).  Tap over the affected sinus to check for signs of infection.  View the inside of your sinuses (endoscopy) with a special imaging device with a light attached (endoscope), which is inserted into your sinuses. If your caregiver suspects that you have chronic sinusitis, one or more of the following tests may be recommended:  Allergy tests.  Nasal culture A sample of mucus is taken from your nose and sent to a lab and screened for bacteria.  Nasal cytology A sample of mucus is taken from your nose and examined by your caregiver to determine if your sinusitis is related to an allergy. TREATMENT  Most cases of acute sinusitis are related to a viral infection and will resolve on their own within 10 days. Sometimes medicines are prescribed to help relieve symptoms (pain medicine, decongestants, nasal steroid sprays, or saline sprays).  However, for sinusitis related to a bacterial infection, your caregiver will prescribe antibiotic medicines. These are medicines that will help kill the bacteria causing the infection.  Rarely, sinusitis is caused by a fungal infection. In theses cases, your caregiver will prescribe antifungal medicine. For some cases of chronic sinusitis, surgery is needed. Generally, these are cases in which sinusitis recurs more than 3 times per year, despite other treatments. HOME CARE INSTRUCTIONS   Drink plenty of water. Water helps thin the mucus so your sinuses can drain  more easily.  Use a humidifier.  Inhale steam 3 to 4 times a day (for example, sit in the bathroom with the shower running).  Apply a warm, moist washcloth to your face 3 to 4 times a day, or as directed by your caregiver.  Use saline nasal sprays to help moisten and clean your sinuses.  Take over-the-counter or prescription medicines for pain, discomfort, or fever only as directed by your caregiver. SEEK IMMEDIATE MEDICAL CARE IF:  You have increasing pain or severe headaches.  You have  nausea, vomiting, or drowsiness.  You have swelling around your face.  You have vision problems.  You have a stiff neck.  You have difficulty breathing. MAKE SURE YOU:   Understand these instructions.  Will watch your condition.  Will get help right away if you are not doing well or get worse. Document Released: 06/27/2005 Document Revised: 09/19/2011 Document Reviewed: 07/12/2011 [facility] Patient Information 2013 [place], [place].

## 2012-11-02 NOTE — Progress Notes (Signed)
79 yo woman at [facility] who developed headache (pressure like, occipito-frontal) with loss of balance on Tuesday at the soup kitchen.  She went to the nurse's office and was transported to [facility] where she was diagnosed with a sinus infection after a head scan.   The headache never subsided despite the antibiotic.  During this time, she has continued her other medications and started the azithromycin without any improvement so far.  She has felt a little tight in her chest, but is not significantly short of breath.  Never married, patient recently moved into the [facility] last August.  She had to sell her house.  Objective:  NAD  Results for orders placed during the hospital encounter of 10/30/12  CBC WITH DIFFERENTIAL      Result Value Range   WBC 9.3  4.0 - 10.5 K/uL   RBC 4.79  3.87 - 5.11 MIL/uL   Hemoglobin 14.5  12.0 - 15.0 g/dL   HCT 16.1  09.6 - 04.5 %   MCV 88.7  78.0 - 100.0 fL   MCH 30.3  26.0 - 34.0 pg   MCHC 34.1  30.0 - 36.0 g/dL   RDW 40.9  81.1 - 91.4 %   Platelets 351  150 - 400 K/uL   Neutrophils Relative 50  43 - 77 %   Neutro Abs 4.6  1.7 - 7.7 K/uL   Lymphocytes Relative 40  12 - 46 %   Lymphs Abs 3.7  0.7 - 4.0 K/uL   Monocytes Relative 9  3 - 12 %   Monocytes Absolute 0.8  0.1 - 1.0 K/uL   Eosinophils Relative 1  0 - 5 %   Eosinophils Absolute 0.1  0.0 - 0.7 K/uL   Basophils Relative 1  0 - 1 %   Basophils Absolute 0.1  0.0 - 0.1 K/uL  BASIC METABOLIC PANEL      Result Value Range   Sodium 141  135 - 145 mEq/L   Potassium 4.2  3.5 - 5.1 mEq/L   Chloride 105  96 - 112 mEq/L   CO2 25  19 - 32 mEq/L   Glucose, Bld 110 (*) 70 - 99 mg/dL   BUN 11  6 - 23 mg/dL   Creatinine, Ser 7.82  0.50 - 1.10 mg/dL   Calcium 9.6  8.4 - 95.6 mg/dL   GFR calc non Af Amer 74 (*) >90 mL/min   GFR calc Af Amer 86 (*) >90 mL/min  TROPONIN I      Result Value Range   Troponin I <0.30  <0.30 ng/mL  URINALYSIS, ROUTINE W REFLEX MICROSCOPIC      Result Value  Range   Color, Urine YELLOW  YELLOW   APPearance CLEAR  CLEAR   Specific Gravity, Urine 1.009  1.005 - 1.030   pH 5.5  5.0 - 8.0   Glucose, UA NEGATIVE  NEGATIVE mg/dL   Hgb urine dipstick TRACE (*) NEGATIVE   Bilirubin Urine NEGATIVE  NEGATIVE   Ketones, ur NEGATIVE  NEGATIVE mg/dL   Protein, ur NEGATIVE  NEGATIVE mg/dL   Urobilinogen, UA 0.2  0.0 - 1.0 mg/dL   Nitrite NEGATIVE  NEGATIVE   Leukocytes, UA NEGATIVE  NEGATIVE  URINE MICROSCOPIC-ADD ON      Result Value Range   Squamous Epithelial / LPF FEW (*) RARE   Bacteria, UA FEW (*) RARE   Clinical Data: 79 year old female with headache and dizziness.  CT HEAD WITHOUT CONTRAST  Technique: Contiguous axial  images were obtained from the base of  the skull through the vertex without contrast.  Comparison: 06/12/2011 CT  Findings: Minimal generalized cerebral volume loss and chronic  small vessel white matter ischemic changes noted.  No acute intracranial abnormalities are identified, including mass  lesion or mass effect, hydrocephalus, extra-axial fluid collection,  midline shift, hemorrhage, or acute infarction.  The visualized bony calvarium is unremarkable except for chronic  left maxillary sinus mucoperiosteal thickening.  IMPRESSION:  No evidence of acute intracranial abnormality.  Chronic left maxillary sinus disease.  Original Report Authenticated By: Harmon Pier, M.D.   HEENT:  Cerumen right ear, some old scarring left TM, eczema behind left auricle  Otherwise negative Chest:  Clear Heart:  Regular without murmur or gallop Abdomen:  Umbilical hernia (nontender), protuberant abdomen without tenderness, HSM, or mass Extrem:  No edema Results for orders placed in visit on 11/02/12  POCT GLYCOSYLATED HEMOGLOBIN (HGB A1C)      Result Value Range   Hemoglobin A1C 6.2    POCT CBC      Result Value Range   WBC 10.1  4.6 - 10.2 K/uL   Lymph, poc 3.8 (*) 0.6 - 3.4   POC LYMPH PERCENT 38.0  10 - 50 %L   MID (cbc) 1.1  (*) 0 - 0.9   POC MID % 11.2  0 - 12 %M   POC Granulocyte 5.1  2 - 6.9   Granulocyte percent 50.8  37 - 80 %G   RBC 4.83  4.04 - 5.48 M/uL   Hemoglobin 14.3  12.2 - 16.2 g/dL   HCT, POC 16.1  09.6 - 47.9 %   MCV 93.1  80 - 97 fL   MCH, POC 29.6  27 - 31.2 pg   MCHC 31.8  31.8 - 35.4 g/dL   RDW, POC 04.5     Platelet Count, POC 372  142 - 424 K/uL   MPV 10.0  0 - 99.8 fL   Right ear lavaged without complication: clear   Patient's sensation of imbalance resolved.  Assessment:  Elevated blood pressure, well-controlled glucose in the context of headache and mild imbalance.  I don't have a good explanation for these symptoms, but they may be related to allergies  Plan: Allegra 180 qd Follow up next week with Dr. Clelia Croft to consider other blood pressure medications.  Signed, Sheila Oats.D.

## 2012-11-07 NOTE — Telephone Encounter (Signed)
Pt was seen in clinic on 4/25 and this was addressed.

## 2012-12-14 ENCOUNTER — Ambulatory Visit (INDEPENDENT_AMBULATORY_CARE_PROVIDER_SITE_OTHER): Payer: Medicare Other | Admitting: Family Medicine

## 2012-12-14 VITALS — BP 150/76 | HR 74 | Temp 97.4°F | Resp 18 | Ht 63.0 in | Wt 172.0 lb

## 2012-12-14 DIAGNOSIS — R05 Cough: Secondary | ICD-10-CM | POA: Diagnosis not present

## 2012-12-14 DIAGNOSIS — R21 Rash and other nonspecific skin eruption: Secondary | ICD-10-CM | POA: Diagnosis not present

## 2012-12-14 DIAGNOSIS — K429 Umbilical hernia without obstruction or gangrene: Secondary | ICD-10-CM | POA: Diagnosis not present

## 2012-12-14 DIAGNOSIS — R059 Cough, unspecified: Secondary | ICD-10-CM | POA: Diagnosis not present

## 2012-12-14 MED ORDER — BENZONATATE 200 MG PO CAPS: 200.0000 mg | ORAL_CAPSULE | Freq: Three times a day (TID) | ORAL | Status: DC | PRN

## 2012-12-14 MED ORDER — RANITIDINE HCL 150 MG PO TABS: 150.0000 mg | ORAL_TABLET | Freq: Two times a day (BID) | ORAL | Status: DC

## 2012-12-14 MED ORDER — NYSTATIN-TRIAMCINOLONE 100000-0.1 UNIT/GM-% EX OINT: TOPICAL_OINTMENT | Freq: Three times a day (TID) | CUTANEOUS | Status: DC

## 2012-12-14 NOTE — Progress Notes (Signed)
  Subjective:    Patient ID: Catherine Ellis, female    DOB: 22-Feb-1922, 79 y.o.   MRN: [id number] Chief Complaint  Patient presents with  . Follow-up    DM  . Vaginitis  . Sinus Problem    HPI  Pt has an extensive his of allergies meds. Which she broat in wth her today fishoil occ -o about bid Nystatin poswder not helping, tried not using suop so has alternative methad Metformin 500bbac lisiniopril 5 a.m. flonase nasal spreay neomyhcine and polymyxin b and hydrocoridonw otc   Her ain concern is about her hernia and her peritoneal itching and she starts couging prev.  She would like hernia repeair but it not through surgery. She did ses a sergioun but they wounle't know until they get in - didn't discuss risk - he really did nowl]  She has bee plaing a nustatin power qis to inguinal area - now occ one side itchy more.  Bleeding has calm down and stopped since she has gone to the doctor.   Has never seen a dermatologist befor for other than her rosaceoa.  Lots of sinus insses in pfamacy and hse just got it this week.  Coughs more in a crowd - when she gets neurvase. Has taken woarm salt water and soakd it up her nowse.  Does have congestion a small amount. occ at thimes or hearburn, inidicenty, or rehab.  Past Medical History  Diagnosis Date  . Arthritis   . Diabetes mellitus   . Hypertension   . Hyperlipidemia   . Allergy    Current Outpatient Prescriptions on File Prior to Visit  Medication Sig Dispense Refill  . fish oil-omega-3 fatty acids 1000 MG capsule Take 2 g by mouth daily.      . fluticasone (FLONASE) 50 MCG/ACT nasal spray Place 2 sprays into the nose daily.  16 g  0  . metFORMIN (GLUCOPHAGE) 500 MG tablet Take 1 tablet (500 mg total) by mouth 2 (two) times daily with a meal.  180 tablet  2   No current facility-administered medications on file prior to visit.   No Known Allergies  Review of Systems    BP 150/76  Pulse 74  Temp(Src) 97.4 F (36.3 C)  Resp  18  Ht 5\' 3"  (1.6 m)  Wt 172 lb (78.019 kg)  BMI 30.48 kg/m2 Objective:   Physical Exam        Assessment & Plan:  Cough  Rash and nonspecific skin eruption  Umbilical hernia  Meds ordered this encounter  Medications  . DISCONTD: fluconazole (DIFLUCAN) 150 MG tablet    Sig:   . ranitidine (ZANTAC) 150 MG tablet    Sig: Take 1 tablet (150 mg total) by mouth 2 (two) times daily. As needed for cough    Dispense:  60 tablet    Refill:  0  . benzonatate (TESSALON) 200 MG capsule    Sig: Take 1 capsule (200 mg total) by mouth 3 (three) times daily as needed for cough.    Dispense:  30 capsule    Refill:  0  . nystatin-triamcinolone ointment (MYCOLOG)    Sig: Apply topically 3 (three) times daily.    Dispense:  60 g    Refill:  3

## 2013-01-04 ENCOUNTER — Ambulatory Visit (INDEPENDENT_AMBULATORY_CARE_PROVIDER_SITE_OTHER): Payer: Medicare Other | Admitting: Emergency Medicine

## 2013-01-04 VITALS — BP 142/64 | HR 87 | Temp 97.9°F | Resp 18 | Ht 63.0 in | Wt 168.0 lb

## 2013-01-04 DIAGNOSIS — K429 Umbilical hernia without obstruction or gangrene: Secondary | ICD-10-CM

## 2013-01-04 DIAGNOSIS — K625 Hemorrhage of anus and rectum: Secondary | ICD-10-CM

## 2013-01-04 LAB — POCT CBC
Granulocyte percent: 67.2 %G (ref 37–80)
HCT, POC: 44.3 % (ref 37.7–47.9)
Hemoglobin: 14.2 g/dL (ref 12.2–16.2)
Lymph, poc: 3.2 (ref 0.6–3.4)
MCH, POC: 30.5 pg (ref 27–31.2)
MCHC: 32.1 g/dL (ref 31.8–35.4)
MCV: 95.2 fL (ref 80–97)
MID (cbc): 0.7 (ref 0–0.9)
MPV: 9.6 fL (ref 0–99.8)
POC Granulocyte: 5.2 (ref 2–6.9)
POC LYMPH PERCENT: 35.2 %L (ref 10–50)
POC MID %: 7.6 %M (ref 0–12)
Platelet Count, POC: 366 10*3/uL (ref 142–424)
RBC: 4.65 M/uL (ref 4.04–5.48)
RDW, POC: 14.1 %
WBC: 9.1 10*3/uL (ref 4.6–10.2)

## 2013-01-04 LAB — IFOBT (OCCULT BLOOD): IFOBT: NEGATIVE

## 2013-01-04 NOTE — Progress Notes (Signed)
[facility] [address] [place] [address] [phone]  Date:  01/04/2013   Name:  Catherine Ellis   DOB:  Nov 30, 1921   MRN:  [id number]  PCP:  Catherine Sorenson, MD    Chief Complaint: Follow-up, Rectal Bleeding and Umbilical Hernia   History of Present Illness:  Catherine Ellis is a 79 y.o. very pleasant female patient who presents with the following:  Noticed a small amount of BRB with a stool on the paper one time.  Painless stool.  No history of prior bleeding.  No weakness, nausea or vomiting.  No change in appetite or activity. Eating and sleeping.  Under treatment with topical agent for "yeast infection".  Current on colonoscopy, last 10 years ago.  No improvement with over the counter medications or other home remedies. Denies other complaint or health concern today.   Patient Active Problem List   Diagnosis Date Noted  . Diabetes mellitus 04/13/2012  . Encounter for long-term (current) use of other medications 04/13/2012  . Incontinence of urine 04/13/2012  . Anal irritation 04/13/2012    Past Medical History  Diagnosis Date  . Arthritis   . Diabetes mellitus   . Hypertension   . Hyperlipidemia   . Allergy     Past Surgical History  Procedure Laterality Date  . Back surgery      1965  . Knee surgery    . Appendectomy    . Eye surgery      History  Substance Use Topics  . Smoking status: Never Smoker   . Smokeless tobacco: Not on file  . Alcohol Use: No    Family History  Problem Relation Age of Onset  . Heart disease Father     No Known Allergies  Medication list has been reviewed and updated.  Current Outpatient Prescriptions on File Prior to Visit  Medication Sig Dispense Refill  . benzonatate (TESSALON) 200 MG capsule Take 1 capsule (200 mg total) by mouth 3 (three) times daily as needed for cough.  30 capsule  0  . fish oil-omega-3 fatty acids 1000 MG capsule Take 2 g by mouth daily.      . fluconazole (DIFLUCAN) 150  MG tablet       . fluticasone (FLONASE) 50 MCG/ACT nasal spray Place 2 sprays into the nose daily.  16 g  0  . lisinopril (PRINIVIL,ZESTRIL) 5 MG tablet Take 1 tablet (5 mg total) by mouth daily.  90 tablet  3  . metFORMIN (GLUCOPHAGE) 500 MG tablet Take 1 tablet (500 mg total) by mouth 2 (two) times daily with a meal.  180 tablet  2  . neomycin-colistin-hydrocortisone-thonzonium (CORTISPORIN TC) 3.09-10-08-0.5 MG/ML otic suspension 3 drops 4 (four) times daily.      Marland Kitchen nystatin-triamcinolone ointment (MYCOLOG) Apply topically 3 (three) times daily.  60 g  3  . ranitidine (ZANTAC) 150 MG tablet Take 1 tablet (150 mg total) by mouth 2 (two) times daily. As needed for cough  60 tablet  0   No current facility-administered medications on file prior to visit.    Review of Systems:  As per HPI, otherwise negative.    Physical Examination: Filed Vitals:   01/04/13 0823  BP: 142/64  Pulse: 87  Temp: 97.9 F (36.6 C)  Resp: 18   Filed Vitals:   01/04/13 0823  Height: 5\' 3"  (1.6 m)  Weight: 168 lb (76.204 kg)   Body mass index is 29.77 kg/(m^2). Ideal Body Weight: Weight in (  lb) to have BMI = 25: 140.8  GEN: WDWN, NAD, Non-toxic, A & O x 3 HEENT: Atraumatic, Normocephalic. Neck supple. No masses, No LAD. Ears and Nose: No external deformity. CV: RRR, No M/G/R. No JVD. No thrill. No extra heart sounds. PULM: CTA B, no wheezes, crackles, rhonchi. No retractions. No resp. distress. No accessory muscle use. ABD: S, NT, ND, +BS. No rebound. No HSM.  Small umbilical hernia easily reduces EXTR: No c/c/e NEURO Normal gait.  PSYCH: Normally interactive. Conversant. Not depressed or anxious appearing.  Calm demeanor.  Rectal heme pending.  No masses   Assessment and Plan: Rectal bleeding.   Signed,  Phillips Odor, MD

## 2013-01-04 NOTE — Addendum Note (Signed)
Addended by: Elease Etienne A on: 01/04/2013 09:25 AM   Modules accepted: Orders

## 2013-01-04 NOTE — Patient Instructions (Signed)
Rectal Bleeding Rectal bleeding is when blood passes out of the anus. It is usually a sign that something is wrong. It may not be serious, but it should always be evaluated. Rectal bleeding may present as bright red blood or extremely dark stools. The color may range from dark red or maroon to black (like tar). It is important that the cause of rectal bleeding be identified so treatment can be started and the problem corrected. CAUSES   Hemorrhoids. These are enlarged (dilated) blood vessels or veins in the anal or rectal area.  Fistulas. Theseare abnormal, burrowing channels that usually run from inside the rectum to the skin around the anus. They can bleed.  Anal fissures. This is a tear in the tissue of the anus. Bleeding occurs with bowel movements.  Diverticulosis. This is a condition in which pockets or sacs project from the bowel wall. Occasionally, the sacs can bleed.  Diverticulitis. Thisis an infection involving diverticulosis of the colon.  Proctitis and colitis. These are conditions in which the rectum, colon, or both, can become inflamed and pitted (ulcerated).  Polyps and cancer. Polyps are non-cancerous (benign) growths in the colon that may bleed. Certain types of polyps turn into cancer.  Protrusion of the rectum. Part of the rectum can project from the anus and bleed.  Certain medicines.  Intestinal infections.  Blood vessel abnormalities. HOME CARE INSTRUCTIONS  Eat a high-fiber diet to keep your stool soft.  Limit activity.  Drink enough fluids to keep your urine clear or pale yellow.  Warm baths may be useful to soothe rectal pain.  Follow up with your caregiver as directed. SEEK IMMEDIATE MEDICAL CARE IF:  You develop increased bleeding.  You have black or dark red stools.  You vomit blood or material that looks like coffee grounds.  You have abdominal pain or tenderness.  You have a fever.  You feel weak, nauseous, or you faint.  You have  severe rectal pain or you are unable to have a bowel movement. MAKE SURE YOU:  Understand these instructions.  Will watch your condition.  Will get help right away if you are not doing well or get worse. Document Released: 12/17/2001 Document Revised: 09/19/2011 Document Reviewed: 12/12/2010 [employer] Patient Information 2014 [employer].  

## 2013-02-22 ENCOUNTER — Encounter: Payer: Self-pay | Admitting: Family Medicine

## 2013-02-22 ENCOUNTER — Ambulatory Visit (INDEPENDENT_AMBULATORY_CARE_PROVIDER_SITE_OTHER): Payer: Medicare Other | Admitting: Family Medicine

## 2013-02-22 VITALS — BP 148/62 | HR 70 | Temp 98.0°F | Resp 16 | Ht 63.0 in | Wt 169.0 lb

## 2013-02-22 DIAGNOSIS — R413 Other amnesia: Secondary | ICD-10-CM | POA: Diagnosis not present

## 2013-02-22 DIAGNOSIS — E871 Hypo-osmolality and hyponatremia: Secondary | ICD-10-CM | POA: Insufficient documentation

## 2013-02-22 DIAGNOSIS — E785 Hyperlipidemia, unspecified: Secondary | ICD-10-CM | POA: Insufficient documentation

## 2013-02-22 DIAGNOSIS — E1169 Type 2 diabetes mellitus with other specified complication: Secondary | ICD-10-CM | POA: Insufficient documentation

## 2013-02-22 DIAGNOSIS — I10 Essential (primary) hypertension: Secondary | ICD-10-CM

## 2013-02-22 DIAGNOSIS — E119 Type 2 diabetes mellitus without complications: Secondary | ICD-10-CM

## 2013-02-22 DIAGNOSIS — J209 Acute bronchitis, unspecified: Secondary | ICD-10-CM

## 2013-02-22 DIAGNOSIS — Z79899 Other long term (current) drug therapy: Secondary | ICD-10-CM | POA: Diagnosis not present

## 2013-02-22 LAB — LIPID PANEL
Cholesterol: 199 mg/dL (ref 0–200)
HDL: 44 mg/dL (ref >39)
LDL Cholesterol: 138 mg/dL — ABNORMAL HIGH (ref 0–99)
Total CHOL/HDL Ratio: 4.5 Ratio
Triglycerides: 85 mg/dL (ref <150)
VLDL: 17 mg/dL (ref 0–40)

## 2013-02-22 LAB — POCT GLYCOSYLATED HEMOGLOBIN (HGB A1C): Hemoglobin A1C: 6.1

## 2013-02-22 LAB — POCT CBC
Granulocyte percent: 62.8 %G (ref 37–80)
HCT, POC: 43.4 % (ref 37.7–47.9)
Hemoglobin: 13.9 g/dL (ref 12.2–16.2)
Lymph, poc: 3 (ref 0.6–3.4)
MCH, POC: 30.4 pg (ref 27–31.2)
MCHC: 32 g/dL (ref 31.8–35.4)
MCV: 94.9 fL (ref 80–97)
MID (cbc): 0.8 (ref 0–0.9)
MPV: 10 fL (ref 0–99.8)
POC Granulocyte: 6.5 (ref 2–6.9)
POC LYMPH PERCENT: 29.2 %L (ref 10–50)
POC MID %: 8 %M (ref 0–12)
Platelet Count, POC: 340 10*3/uL (ref 142–424)
RBC: 4.57 M/uL (ref 4.04–5.48)
RDW, POC: 13.3 %
WBC: 10.4 10*3/uL — AB (ref 4.6–10.2)

## 2013-02-22 LAB — VITAMIN B12: Vitamin B-12: 286 pg/mL (ref 211–911)

## 2013-02-22 LAB — COMPREHENSIVE METABOLIC PANEL
ALT: 9 U/L (ref 0–35)
AST: 12 U/L (ref 0–37)
Albumin: 4.3 g/dL (ref 3.5–5.2)
Alkaline Phosphatase: 64 U/L (ref 39–117)
BUN: 17 mg/dL (ref 6–23)
CO2: 26 mEq/L (ref 19–32)
Calcium: 9.4 mg/dL (ref 8.4–10.5)
Chloride: 102 mEq/L (ref 96–112)
Creat: 0.73 mg/dL (ref 0.50–1.10)
Glucose, Bld: 134 mg/dL — ABNORMAL HIGH (ref 70–99)
Potassium: 4.7 mEq/L (ref 3.5–5.3)
Sodium: 137 mEq/L (ref 135–145)
Total Bilirubin: 0.5 mg/dL (ref 0.3–1.2)
Total Protein: 7.1 g/dL (ref 6.0–8.3)

## 2013-02-22 MED ORDER — GUAIFENESIN-CODEINE 100-10 MG/5ML PO SOLN: 5.0000 mL | Freq: Three times a day (TID) | ORAL | Status: DC | PRN

## 2013-02-22 MED ORDER — AZITHROMYCIN 250 MG PO TABS: ORAL_TABLET | ORAL | Status: DC

## 2013-02-22 MED ORDER — LISINOPRIL 10 MG PO TABS: 10.0000 mg | ORAL_TABLET | Freq: Every day | ORAL | Status: DC

## 2013-02-22 NOTE — Progress Notes (Signed)
Subjective:    Patient ID: Catherine Ellis, female    DOB: April 12, 1922, 79 y.o.   MRN: [id number] Chief Complaint  Patient presents with  . Cough  . Congestion  . Diabetes  . Hypertension    HPI  Feeling sick and coughing a lot more with much more productive sputum - white.  Head feels funny and a little nasal congestion.  No SHoB and CP.  Has been doing the sinus rinse but didn't help to much.  No f/c  Started some new mail-order vitamin tablets for her memory.  Is noticing a lot more problems with her memory - can't remember names as well.  Rash is much improved but continuing to use the oinment so it stays gone.  Past Medical History  Diagnosis Date  . Arthritis   . Diabetes mellitus   . Hypertension   . Hyperlipidemia   . Allergy    Current Outpatient Prescriptions on File Prior to Visit  Medication Sig Dispense Refill  . benzonatate (TESSALON) 200 MG capsule Take 1 capsule (200 mg total) by mouth 3 (three) times daily as needed for cough.  30 capsule  0  . lisinopril (PRINIVIL,ZESTRIL) 5 MG tablet Take 1 tablet (5 mg total) by mouth daily.  90 tablet  3  . metFORMIN (GLUCOPHAGE) 500 MG tablet Take 1 tablet (500 mg total) by mouth 2 (two) times daily with a meal.  180 tablet  2  . ranitidine (ZANTAC) 150 MG tablet Take 1 tablet (150 mg total) by mouth 2 (two) times daily. As needed for cough  60 tablet  0  . fish oil-omega-3 fatty acids 1000 MG capsule Take 2 g by mouth daily.      . fluconazole (DIFLUCAN) 150 MG tablet       . fluticasone (FLONASE) 50 MCG/ACT nasal spray Place 2 sprays into the nose daily.  16 g  0  . neomycin-colistin-hydrocortisone-thonzonium (CORTISPORIN TC) 3.09-10-08-0.5 MG/ML otic suspension 3 drops 4 (four) times daily.      Marland Kitchen nystatin-triamcinolone ointment (MYCOLOG) Apply topically 3 (three) times daily.  60 g  3   No current facility-administered medications on file prior to visit.   No Known Allergies     Review of Systems  Constitutional:  Positive for fatigue. Negative for fever, chills, diaphoresis and activity change.  HENT: Positive for congestion, rhinorrhea and sinus pressure. Negative for ear pain, sore throat, facial swelling, neck pain and neck stiffness.   Respiratory: Positive for cough. Negative for chest tightness, shortness of breath and wheezing.   Cardiovascular: Negative for chest pain, palpitations and leg swelling.  Gastrointestinal: Negative for nausea and vomiting.  Musculoskeletal: Negative for back pain and arthralgias.  Skin: Positive for rash.  Neurological: Positive for headaches.  Hematological: Negative for adenopathy.  Psychiatric/Behavioral: Positive for sleep disturbance.       Sleeps a lot      BP 148/62  Pulse 70  Temp(Src) 98 F (36.7 C) (Oral)  Resp 16  Ht 5\' 3"  (1.6 m)  Wt 169 lb (76.658 kg)  BMI 29.94 kg/m2  SpO2 96% Objective:   Physical Exam  Constitutional: She is oriented to person, place, and time. She appears well-developed and well-nourished. No distress.  Frequent hacking paroxysmal cough during history and exam  HENT:  Head: Normocephalic and atraumatic.  Right Ear: Tympanic membrane, external ear and ear canal normal.  Left Ear: Tympanic membrane, external ear and ear canal normal.  Nose: Nose normal. No mucosal edema or rhinorrhea.  Mouth/Throat: Uvula is midline, oropharynx is clear and moist and mucous membranes are normal. No oropharyngeal exudate.  Eyes: Conjunctivae are normal. Right eye exhibits no discharge. Left eye exhibits no discharge. No scleral icterus.  Neck: Normal range of motion. Neck supple.  Cardiovascular: Normal rate, regular rhythm, normal heart sounds and intact distal pulses.   Pulmonary/Chest: Effort normal and breath sounds normal.  Some upper airway sounds but lower airway/lungs clear  Lymphadenopathy:    She has no cervical adenopathy.  Neurological: She is alert and oriented to person, place, and time.  Skin: Skin is warm and dry. She  is not diaphoretic. No erythema.  Psychiatric: She has a normal mood and affect. Her behavior is normal.      Results for orders placed in visit on 02/22/13  POCT CBC      Result Value Range   WBC 10.4 (*) 4.6 - 10.2 K/uL   Lymph, poc 3.0  0.6 - 3.4   POC LYMPH PERCENT 29.2  10 - 50 %L   MID (cbc) 0.8  0 - 0.9   POC MID % 8.0  0 - 12 %M   POC Granulocyte 6.5  2 - 6.9   Granulocyte percent 62.8  37 - 80 %G   RBC 4.57  4.04 - 5.48 M/uL   Hemoglobin 13.9  12.2 - 16.2 g/dL   HCT, POC 16.1  09.6 - 47.9 %   MCV 94.9  80 - 97 fL   MCH, POC 30.4  27 - 31.2 pg   MCHC 32.0  31.8 - 35.4 g/dL   RDW, POC 04.5     Platelet Count, POC 340  142 - 424 K/uL   MPV 10.0  0 - 99.8 fL  POCT GLYCOSYLATED HEMOGLOBIN (HGB A1C)      Result Value Range   Hemoglobin A1C 6.1      Assessment & Plan:  Memory loss - Plan: Vitamin B12  Hyponatremia  DM type 2 (diabetes mellitus, type 2) - Plan: lisinopril (PRINIVIL,ZESTRIL) 10 MG tablet, POCT glycosylated hemoglobin (Hb A1C), Lipid panel  Acute bronchitis - Plan: POCT CBC  Hypertension - increase lisinopril from 5 to 10 mg  Encounter for long-term (current) use of other medications - Plan: Comprehensive metabolic panel  Other and unspecified hyperlipidemia - Plan: Lipid panel  Meds ordered this encounter  Medications  . lisinopril (PRINIVIL,ZESTRIL) 10 MG tablet    Sig: Take 1 tablet (10 mg total) by mouth daily.    Dispense:  90 tablet    Refill:  0  . azithromycin (ZITHROMAX) 250 MG tablet    Sig: Take 2 tabs PO x 1 dose, then 1 tab PO QD x 4 days    Dispense:  6 tablet    Refill:  0  . guaiFENesin-codeine 100-10 MG/5ML syrup    Sig: Take 5 mL by mouth 3 (three) times daily as needed for cough.    Dispense:  120 mL    Refill:  0

## 2013-02-22 NOTE — Patient Instructions (Signed)
Increase your lisinopril from 5mg  to 10mg  daily.

## 2013-02-25 ENCOUNTER — Telehealth: Payer: Self-pay

## 2013-02-25 NOTE — Telephone Encounter (Signed)
[facility] will need yeast medication since she is taking antibiotic   [facility] is [phone]  [facility] [address]

## 2013-02-26 ENCOUNTER — Encounter: Payer: Self-pay | Admitting: Family Medicine

## 2013-02-27 MED ORDER — FLUCONAZOLE 150 MG PO TABS: 150.0000 mg | ORAL_TABLET | Freq: Once | ORAL | Status: DC

## 2013-02-27 NOTE — Telephone Encounter (Signed)
Nystatin swish and swallow sent to pharmacy

## 2013-02-27 NOTE — Telephone Encounter (Signed)
Called pt [facility] Rx sent to pharmacy.

## 2013-04-17 DIAGNOSIS — E119 Type 2 diabetes mellitus without complications: Secondary | ICD-10-CM | POA: Diagnosis not present

## 2013-04-17 DIAGNOSIS — Z961 Presence of intraocular lens: Secondary | ICD-10-CM | POA: Diagnosis not present

## 2013-04-17 DIAGNOSIS — H52209 Unspecified astigmatism, unspecified eye: Secondary | ICD-10-CM | POA: Diagnosis not present

## 2013-05-23 ENCOUNTER — Encounter: Payer: Self-pay | Admitting: Family Medicine

## 2013-06-04 ENCOUNTER — Other Ambulatory Visit: Payer: Self-pay | Admitting: Family Medicine

## 2013-06-14 ENCOUNTER — Ambulatory Visit (INDEPENDENT_AMBULATORY_CARE_PROVIDER_SITE_OTHER): Payer: Medicare Other | Admitting: Family Medicine

## 2013-06-14 VITALS — BP 130/70 | HR 86 | Temp 97.6°F | Resp 16 | Ht 63.0 in | Wt 172.4 lb

## 2013-06-14 DIAGNOSIS — M899 Disorder of bone, unspecified: Secondary | ICD-10-CM

## 2013-06-14 DIAGNOSIS — R5381 Other malaise: Secondary | ICD-10-CM | POA: Diagnosis not present

## 2013-06-14 DIAGNOSIS — E119 Type 2 diabetes mellitus without complications: Secondary | ICD-10-CM | POA: Diagnosis not present

## 2013-06-14 DIAGNOSIS — R413 Other amnesia: Secondary | ICD-10-CM

## 2013-06-14 DIAGNOSIS — Z23 Encounter for immunization: Secondary | ICD-10-CM | POA: Diagnosis not present

## 2013-06-14 DIAGNOSIS — R32 Unspecified urinary incontinence: Secondary | ICD-10-CM

## 2013-06-14 DIAGNOSIS — R5383 Other fatigue: Secondary | ICD-10-CM | POA: Insufficient documentation

## 2013-06-14 DIAGNOSIS — E538 Deficiency of other specified B group vitamins: Secondary | ICD-10-CM | POA: Diagnosis not present

## 2013-06-14 DIAGNOSIS — E559 Vitamin D deficiency, unspecified: Secondary | ICD-10-CM

## 2013-06-14 DIAGNOSIS — E871 Hypo-osmolality and hyponatremia: Secondary | ICD-10-CM

## 2013-06-14 DIAGNOSIS — I10 Essential (primary) hypertension: Secondary | ICD-10-CM | POA: Diagnosis not present

## 2013-06-14 DIAGNOSIS — F39 Unspecified mood [affective] disorder: Secondary | ICD-10-CM

## 2013-06-14 DIAGNOSIS — E785 Hyperlipidemia, unspecified: Secondary | ICD-10-CM | POA: Diagnosis not present

## 2013-06-14 DIAGNOSIS — M8580 Other specified disorders of bone density and structure, unspecified site: Secondary | ICD-10-CM

## 2013-06-14 DIAGNOSIS — Z79899 Other long term (current) drug therapy: Secondary | ICD-10-CM

## 2013-06-14 DIAGNOSIS — F338 Other recurrent depressive disorders: Secondary | ICD-10-CM | POA: Insufficient documentation

## 2013-06-14 LAB — CBC WITH DIFFERENTIAL/PLATELET
Basophils Absolute: 0 10*3/uL (ref 0.0–0.1)
Basophils Relative: 0 % (ref 0–1)
Eosinophils Absolute: 0.1 10*3/uL (ref 0.0–0.7)
Eosinophils Relative: 1 % (ref 0–5)
HCT: 43.3 % (ref 36.0–46.0)
Hemoglobin: 15 g/dL (ref 12.0–15.0)
Lymphocytes Relative: 38 % (ref 12–46)
Lymphs Abs: 4.3 10*3/uL — ABNORMAL HIGH (ref 0.7–4.0)
MCH: 30.4 pg (ref 26.0–34.0)
MCHC: 34.6 g/dL (ref 30.0–36.0)
MCV: 87.8 fL (ref 78.0–100.0)
Monocytes Absolute: 0.8 10*3/uL (ref 0.1–1.0)
Monocytes Relative: 7 % (ref 3–12)
Neutro Abs: 6 10*3/uL (ref 1.7–7.7)
Neutrophils Relative %: 54 % (ref 43–77)
Platelets: 369 10*3/uL (ref 150–400)
RBC: 4.93 MIL/uL (ref 3.87–5.11)
RDW: 14 % (ref 11.5–15.5)
WBC: 11.3 10*3/uL — ABNORMAL HIGH (ref 4.0–10.5)

## 2013-06-14 LAB — LIPID PANEL
Cholesterol: 257 mg/dL — ABNORMAL HIGH (ref 0–200)
HDL: 51 mg/dL (ref >39)
LDL Cholesterol: 158 mg/dL — ABNORMAL HIGH (ref 0–99)
Total CHOL/HDL Ratio: 5 Ratio
Triglycerides: 241 mg/dL — ABNORMAL HIGH (ref <150)
VLDL: 48 mg/dL — ABNORMAL HIGH (ref 0–40)

## 2013-06-14 LAB — COMPREHENSIVE METABOLIC PANEL
ALT: 14 U/L (ref 0–35)
AST: 18 U/L (ref 0–37)
Albumin: 4.3 g/dL (ref 3.5–5.2)
Alkaline Phosphatase: 60 U/L (ref 39–117)
BUN: 11 mg/dL (ref 6–23)
CO2: 25 mEq/L (ref 19–32)
Calcium: 9.6 mg/dL (ref 8.4–10.5)
Chloride: 102 mEq/L (ref 96–112)
Creat: 0.76 mg/dL (ref 0.50–1.10)
Glucose, Bld: 107 mg/dL — ABNORMAL HIGH (ref 70–99)
Potassium: 4.5 mEq/L (ref 3.5–5.3)
Sodium: 138 mEq/L (ref 135–145)
Total Bilirubin: 0.7 mg/dL (ref 0.3–1.2)
Total Protein: 8.1 g/dL (ref 6.0–8.3)

## 2013-06-14 LAB — POCT URINALYSIS DIPSTICK
Bilirubin, UA: NEGATIVE
Glucose, UA: NEGATIVE
Ketones, UA: NEGATIVE
Nitrite, UA: NEGATIVE
Protein, UA: NEGATIVE
Spec Grav, UA: 1.025
Urobilinogen, UA: 0.2
pH, UA: 5.5

## 2013-06-14 LAB — POCT UA - MICROSCOPIC ONLY
Casts, Ur, LPF, POC: NEGATIVE
Crystals, Ur, HPF, POC: NEGATIVE
Mucus, UA: NEGATIVE
Yeast, UA: NEGATIVE

## 2013-06-14 LAB — TSH: TSH: 2.173 u[IU]/mL (ref 0.350–4.500)

## 2013-06-14 LAB — POCT GLYCOSYLATED HEMOGLOBIN (HGB A1C): Hemoglobin A1C: 6.1

## 2013-06-14 MED ORDER — CYANOCOBALAMIN 1000 MCG/ML IJ SOLN
1000.0000 ug | Freq: Once | INTRAMUSCULAR | Status: AC
  Administered 2013-06-14: 1000 ug via INTRAMUSCULAR

## 2013-06-14 NOTE — Progress Notes (Signed)
Subjective:    Patient ID: Catherine Ellis, female    DOB: 01-31-1922, 79 y.o.   MRN: [id number] Chief Complaint  Patient presents with  . Diabetes  . Hypertension    HPI   Always get depressed and stressed over the holiday season. Now that she is at [facility] she does not have transportation, gets stressed out about what to get people, hard to get shopping opportunities. Connects it back to a ruptured appendicitis when she was 79 yo over Christmas, niece was killed in accident on Christmas Eve.  Has been a life long problem for her of stress over the holidays.  Not currently taking any supplements - not taking fish oil but going to restart. Tried something for 30d for her memory but didn't help so stopped it.  Not taking any other otc medications.  Past Medical History  Diagnosis Date  . Arthritis   . Diabetes mellitus   . Hypertension   . Hyperlipidemia   . Allergy   . Osteopenia, senile 09/26/2012    T score - 1.6 Left femur   Current Outpatient Prescriptions on File Prior to Visit  Medication Sig Dispense Refill  . lisinopril (PRINIVIL,ZESTRIL) 10 MG tablet TAKE 1 TABLET (10 MG TOTAL) BY MOUTH DAILY.  90 tablet  0  . metFORMIN (GLUCOPHAGE) 500 MG tablet Take 1 tablet (500 mg total) by mouth 2 (two) times daily with a meal.  180 tablet  2  . benzonatate (TESSALON) 200 MG capsule Take 1 capsule (200 mg total) by mouth 3 (three) times daily as needed for cough.  30 capsule  0  . fish oil-omega-3 fatty acids 1000 MG capsule Take 2 g by mouth daily.      . fluconazole (DIFLUCAN) 150 MG tablet Take 1 tablet (150 mg total) by mouth once.  1 tablet  0  . fluticasone (FLONASE) 50 MCG/ACT nasal spray Place 2 sprays into the nose daily.  16 g  0  . guaiFENesin-codeine 100-10 MG/5ML syrup Take 5 mL by mouth 3 (three) times daily as needed for cough.  120 mL  0  . nystatin-triamcinolone ointment (MYCOLOG) Apply topically 3 (three) times daily.  60 g  3  . ranitidine (ZANTAC) 150 MG  tablet Take 1 tablet (150 mg total) by mouth 2 (two) times daily. As needed for cough  60 tablet  0   No current facility-administered medications on file prior to visit.   No Known Allergies  Review of Systems  Constitutional: Positive for fatigue.  Cardiovascular: Negative for chest pain and leg swelling.  Gastrointestinal: Positive for abdominal distention.       Umbilical hernia, not painful  Genitourinary: Positive for urgency, frequency and enuresis. Negative for dysuria, hematuria, decreased urine volume, difficulty urinating, genital sores and pelvic pain.  Neurological: Negative for dizziness, weakness and light-headedness.  Psychiatric/Behavioral: Positive for sleep disturbance, dysphoric mood and decreased concentration. Negative for suicidal ideas, behavioral problems, self-injury and agitation. The patient is nervous/anxious.       BP 130/70  Pulse 86  Temp(Src) 97.6 F (36.4 C) (Oral)  Resp 16  Ht 5\' 3"  (1.6 m)  Wt 172 lb 6.4 oz (78.2 kg)  BMI 30.55 kg/m2  SpO2 95% Objective:   Physical Exam  Constitutional: She is oriented to person, place, and time. She appears well-developed and well-nourished. No distress.  HENT:  Head: Normocephalic and atraumatic.  Right Ear: External ear normal.  Left Ear: External ear normal.  Eyes: Conjunctivae are normal. No  scleral icterus.  Neck: Normal range of motion. Neck supple. No thyromegaly present.  Cardiovascular: Normal rate, regular rhythm, normal heart sounds and intact distal pulses.   Pulmonary/Chest: Effort normal and breath sounds normal. No respiratory distress.  Musculoskeletal: She exhibits no edema.  Lymphadenopathy:    She has no cervical adenopathy.  Neurological: She is alert and oriented to person, place, and time.  Skin: Skin is warm and dry. She is not diaphoretic. No erythema.  Psychiatric: She has a normal mood and affect. Her behavior is normal.       POCT GLYCOSYLATED HEMOGLOBIN (HGB A1C)       Result Value Range   Hemoglobin A1C 6.1    POCT UA - MICROSCOPIC ONLY      Result Value Range   WBC, Ur, HPF, POC 0-3     RBC, urine, microscopic 0-4     Bacteria, U Microscopic trace     Mucus, UA neg     Epithelial cells, urine per micros 0-4     Crystals, Ur, HPF, POC neg     Casts, Ur, LPF, POC neg     Yeast, UA neg    POCT URINALYSIS DIPSTICK      Result Value Range   Color, UA yellow     Clarity, UA clear     Glucose, UA neg     Bilirubin, UA neg     Ketones, UA neg     Spec Grav, UA 1.025     Blood, UA moderate     pH, UA 5.5     Protein, UA neg     Urobilinogen, UA 0.2     Nitrite, UA neg     Leukocytes, UA Trace      Assessment & Plan:  Need for prophylactic vaccination and inoculation against influenza - Plan: Flu Vaccine QUAD 36+ mos IM  Other and unspecified hyperlipidemia - Plan: Lipid panel - encouraged to restart fish oil, high threshold for starting statin due to age  Memory loss  Incontinence of urine - Plan: POCT UA - Microscopic Only, POCT urinalysis dipstick, Urine culture - mod blood on UA but min on microscopy, likely from external irritation, send for clx and recheck at f/u.  Hyponatremia  Hypertension - Plan: Comprehensive metabolic panel - well controlled on lisinopril 10, continue  Encounter for long-term (current) use of other medications  Diabetes mellitus - Plan: POCT glycosylated hemoglobin (Hb A1C), Microalbumin/Creatinine Ratio, Urine - very well controlled, cont metformin 500 bid.  Other malaise and fatigue - Plan: CBC with Differential, TSH, Comprehensive metabolic panel  Seasonal affective disorder  Vitamin B12 deficiency - Plan: cyanocobalamin ((VITAMIN B-12)) injection 1,000 mcg - ok to cont monthly vit b12 inj if pt likes - try to pay attention to see if helps w/ mood, fatigue, and memory this month  Osteopenia - Plan: Vit D  25 hydroxy (rtn osteoporosis monitoring)  Meds ordered this encounter  Medications  . cyanocobalamin  ((VITAMIN B-12)) injection 1,000 mcg    Sig:     Norberto Sorenson, MD MPH

## 2013-06-15 LAB — MICROALBUMIN / CREATININE URINE RATIO
Creatinine, Urine: 147.6 mg/dL
Microalb Creat Ratio: 26.2 mg/g (ref 0.0–30.0)
Microalb, Ur: 3.87 mg/dL — ABNORMAL HIGH (ref 0.00–1.89)

## 2013-06-15 LAB — VITAMIN D 25 HYDROXY (VIT D DEFICIENCY, FRACTURES): Vit D, 25-Hydroxy: 18 ng/mL — ABNORMAL LOW (ref 30–89)

## 2013-06-16 LAB — URINE CULTURE: Colony Count: 30000

## 2013-07-12 ENCOUNTER — Encounter: Payer: Self-pay | Admitting: Family Medicine

## 2013-07-12 ENCOUNTER — Ambulatory Visit (INDEPENDENT_AMBULATORY_CARE_PROVIDER_SITE_OTHER): Payer: Medicare Other | Admitting: Family Medicine

## 2013-07-12 VITALS — BP 130/60 | HR 75 | Temp 98.4°F | Resp 16 | Ht 62.0 in | Wt 171.0 lb

## 2013-07-12 DIAGNOSIS — R5381 Other malaise: Secondary | ICD-10-CM

## 2013-07-12 DIAGNOSIS — E538 Deficiency of other specified B group vitamins: Secondary | ICD-10-CM

## 2013-07-12 DIAGNOSIS — E559 Vitamin D deficiency, unspecified: Secondary | ICD-10-CM | POA: Diagnosis not present

## 2013-07-12 DIAGNOSIS — M25559 Pain in unspecified hip: Secondary | ICD-10-CM | POA: Diagnosis not present

## 2013-07-12 DIAGNOSIS — E119 Type 2 diabetes mellitus without complications: Secondary | ICD-10-CM

## 2013-07-12 DIAGNOSIS — G8929 Other chronic pain: Secondary | ICD-10-CM

## 2013-07-12 DIAGNOSIS — R269 Unspecified abnormalities of gait and mobility: Secondary | ICD-10-CM

## 2013-07-12 DIAGNOSIS — N393 Stress incontinence (female) (male): Secondary | ICD-10-CM | POA: Diagnosis not present

## 2013-07-12 DIAGNOSIS — Z79899 Other long term (current) drug therapy: Secondary | ICD-10-CM

## 2013-07-12 DIAGNOSIS — R5383 Other fatigue: Secondary | ICD-10-CM

## 2013-07-12 MED ORDER — METFORMIN HCL 500 MG PO TABS: 500.0000 mg | ORAL_TABLET | Freq: Two times a day (BID) | ORAL | Status: DC

## 2013-07-12 MED ORDER — CYANOCOBALAMIN 1000 MCG/ML IJ SOLN
1000.0000 ug | INTRAMUSCULAR | Status: DC
  Administered 2013-07-12 – 2013-08-15 (×2): 1000 ug via INTRAMUSCULAR

## 2013-07-12 MED ORDER — LISINOPRIL 10 MG PO TABS: ORAL_TABLET | ORAL | Status: DC

## 2013-07-12 NOTE — Patient Instructions (Addendum)
Continue on your daily vitamin D 5000u. Lets start giving you monthly vitamin B12 injections and see if that helps you to feel better.  You have a standing order for this in our system so you can just come in at the beginning of each month at a time that is convenient for you. You do not need to schedule for this.  If the burning in your feet gets worse, we can consider a nighttime medication to help treat this nerve pain. I do not think your diabetes is causing it as your diabetes has been so well controlled. I will see you back in 4 months and you should be FASTING at that visit.  Kegel Exercises The goal of Kegel exercises is to isolate and exercise your pelvic floor muscles. These muscles act as a hammock that supports the rectum, vagina, small intestine, and uterus. As the muscles weaken, the hammock sags and these organs are displaced from their normal positions. Kegel exercises can strengthen your pelvic floor muscles and help you to improve bladder and bowel control, improve sexual response, and help reduce many problems and some discomfort during pregnancy. Kegel exercises can be done anywhere and at any time. HOW TO PERFORM KEGEL EXERCISES 1. Locate your pelvic floor muscles. To do this, squeeze (contract) the muscles that you use when you try to stop the flow of urine. You will feel a tightness in the vaginal area (women) and a tight lift in the rectal area (men and women). 2. When you begin, contract your pelvic muscles tight for 2 5 seconds, then relax them for 2 5 seconds. This is one set. Do 4 5 sets with a short pause in between. 3. Contract your pelvic muscles for 8 10 seconds, then relax them for 8 10 seconds. Do 4 5 sets. If you cannot contract your pelvic muscles for 8 10 seconds, try 5 7 seconds and work your way up to 8 10 seconds. Your goal is 4 5 sets of 10 contractions each day. Keep your stomach, buttocks, and legs relaxed during the exercises. Perform sets of both short and long  contractions. Vary your positions. Perform these contractions 3 4 times per day. Perform sets while you are:   Lying in bed in the morning.  Standing at lunch.  Sitting in the late afternoon.  Lying in bed at night. You should do 40 50 contractions per day. Do not perform more Kegel exercises per day than recommended. Overexercising can cause muscle fatigue. Continue these exercises for for at least 15 20 weeks or as directed by your caregiver. Document Released: 06/13/2012 Document Reviewed: 06/13/2012 [facility] Patient Information 2014 [place], [place].

## 2013-07-12 NOTE — Progress Notes (Signed)
Subjective:    Patient ID: Catherine Ellis, female    DOB: [birth year], 80 y.o.   MRN: [id number] Chief Complaint  Patient presents with  . 1 month f/u vit B def    HPI  On vitamin D 5000u daily. Not taking a mvi though and wonders if she should. Having more burning in to soles of her feet at night however sxs are overall better today but new over the past month. Told that her 62 yo sister had some hereditary disease that causes this peripheral neuropathy.  Sleeping 12-13 hrs/day so feels too fatigued like she is sleeping to much. However, knows she often wakes up during the night so not sleeping all the way through. Unsure if any difference of vit B12 - but has been a hard month over the holidays as her 14 yo sister has been in the hospital.  Goes to visit her 30 yo brother but over the past month he has declined to see her - just to tired - which hurt Catherine Ellis's feelings.  Often when she wakes up in the morning - she can't hold her urine long enough to make it to the bathroom.  Knows she is having more problems w/ her balance. Is also having some groin pain which she thinks may be due to hip arthritis - she knows she has severe lumbar arthritis.  Past Medical History  Diagnosis Date  . Arthritis   . Diabetes mellitus   . Hypertension   . Hyperlipidemia   . Allergy   . Osteopenia, senile 09/26/2012    T score - 1.6 Left femur   Current Outpatient Prescriptions on File Prior to Visit  Medication Sig Dispense Refill  . fish oil-omega-3 fatty acids 1000 MG capsule Take 2 g by mouth daily.       No current facility-administered medications on file prior to visit.   No Known Allergies  Review of Systems  Constitutional: Positive for fatigue. Negative for fever, chills, activity change and appetite change.  Genitourinary: Positive for urgency. Negative for dysuria, frequency and hematuria.  Musculoskeletal: Positive for arthralgias, gait problem and myalgias.  Skin: Negative for  color change and rash.  Neurological: Positive for weakness. Negative for seizures.  Psychiatric/Behavioral: Positive for sleep disturbance. Negative for dysphoric mood. The patient is not nervous/anxious.       BP 130/60  Pulse 75  Temp(Src) 98.4 F (36.9 C) (Oral)  Resp 16  Ht 5\' 2"  (1.575 m)  Wt 171 lb (77.565 kg)  BMI 31.27 kg/m2  SpO2 95% Objective:   Physical Exam  Constitutional: She is oriented to person, place, and time. She appears well-developed and well-nourished. No distress.  HENT:  Head: Normocephalic and atraumatic.  Right Ear: External ear normal.  Left Ear: External ear normal.  Eyes: Conjunctivae are normal. No scleral icterus.  Neck: Normal range of motion. Neck supple. No thyromegaly present.  Cardiovascular: Normal rate, regular rhythm, normal heart sounds and intact distal pulses.   Pulmonary/Chest: Effort normal and breath sounds normal. No respiratory distress.  Musculoskeletal: She exhibits no edema.  Lymphadenopathy:    She has no cervical adenopathy.  Neurological: She is alert and oriented to person, place, and time.  Skin: Skin is warm and dry. She is not diaphoretic. No erythema.  Psychiatric: She has a normal mood and affect. Her behavior is normal.      Assessment & Plan:  Unspecified vitamin D deficiency - taking 5000u otc vit D daily - recheck at f/u  in 4 mos  Vitamin B12 deficiency - Plan: cyanocobalamin ((VITAMIN B-12)) injection 1,000 mcg - continue monthly b12 inj for now as long as she is seeing symptomatic improvement in her energy and memory.  Stress incontinence - reviewed kegel exercises  Hip pain, chronic, unspecified laterality - Plan: Ambulatory referral to Physical Therapy  Abnormality of gait - Plan: Ambulatory referral to Physical Therapy  DM type 2 (diabetes mellitus, type 2) - Plan: metFORMIN (GLUCOPHAGE) 500 MG tablet  Other malaise and fatigue  Diabetes mellitus  Encounter for long-term (current) use of other  medications  Meds ordered this encounter  Medications  . OVER THE COUNTER MEDICATION    Sig: Vit D 3 5000 iu taking daily  . cyanocobalamin ((VITAMIN B-12)) injection 1,000 mcg    Sig:   . metFORMIN (GLUCOPHAGE) 500 MG tablet    Sig: Take 1 tablet (500 mg total) by mouth 2 (two) times daily with a meal.    Dispense:  180 tablet    Refill:  2  . lisinopril (PRINIVIL,ZESTRIL) 10 MG tablet    Sig: TAKE 1 TABLET (10 MG TOTAL) BY MOUTH DAILY.    Dispense:  90 tablet    Refill:  2   Delman Cheadle, MD MPH

## 2013-07-24 DIAGNOSIS — R269 Unspecified abnormalities of gait and mobility: Secondary | ICD-10-CM | POA: Diagnosis not present

## 2013-07-24 DIAGNOSIS — M25559 Pain in unspecified hip: Secondary | ICD-10-CM | POA: Diagnosis not present

## 2013-07-29 DIAGNOSIS — M25559 Pain in unspecified hip: Secondary | ICD-10-CM | POA: Diagnosis not present

## 2013-07-29 DIAGNOSIS — R269 Unspecified abnormalities of gait and mobility: Secondary | ICD-10-CM | POA: Diagnosis not present

## 2013-07-31 DIAGNOSIS — R269 Unspecified abnormalities of gait and mobility: Secondary | ICD-10-CM | POA: Diagnosis not present

## 2013-07-31 DIAGNOSIS — M25559 Pain in unspecified hip: Secondary | ICD-10-CM | POA: Diagnosis not present

## 2013-08-10 ENCOUNTER — Encounter: Payer: Self-pay | Admitting: Family Medicine

## 2013-08-10 DIAGNOSIS — E559 Vitamin D deficiency, unspecified: Secondary | ICD-10-CM | POA: Insufficient documentation

## 2013-08-10 MED ORDER — ERGOCALCIFEROL 1.25 MG (50000 UT) PO CAPS
50000.0000 [IU] | ORAL_CAPSULE | ORAL | Status: DC
Start: 2013-08-10 — End: 2013-12-06

## 2013-08-15 ENCOUNTER — Other Ambulatory Visit: Payer: Self-pay | Admitting: Radiology

## 2013-08-15 ENCOUNTER — Telehealth: Payer: Self-pay | Admitting: Radiology

## 2013-08-15 ENCOUNTER — Ambulatory Visit (INDEPENDENT_AMBULATORY_CARE_PROVIDER_SITE_OTHER): Payer: Medicare Other | Admitting: Radiology

## 2013-08-15 DIAGNOSIS — R269 Unspecified abnormalities of gait and mobility: Secondary | ICD-10-CM | POA: Diagnosis not present

## 2013-08-15 DIAGNOSIS — M25559 Pain in unspecified hip: Secondary | ICD-10-CM | POA: Diagnosis not present

## 2013-08-15 DIAGNOSIS — E538 Deficiency of other specified B group vitamins: Secondary | ICD-10-CM | POA: Diagnosis not present

## 2013-08-15 MED ORDER — CYANOCOBALAMIN 1000 MCG/ML IJ SOLN: 1000.0000 ug | Freq: Once | INTRAMUSCULAR | Status: DC

## 2013-08-15 MED ORDER — CYANOCOBALAMIN 1000 MCG/ML IJ SOLN: 1000.0000 ug | INTRAMUSCULAR | Status: DC

## 2013-08-15 NOTE — Progress Notes (Signed)
   Subjective:    Patient ID: Catherine Ellis, female    DOB: [birth year], 80 y.o.   MRN: [id number]  HPI patient returns for repeat B12, needs monthly per Dr Brigitte Pulse. Patient requesting further injections [facility] at [facility], will have nurse call me to see if we can arrange this for her, since she has to pay a taxi $17 for transportation here.   Review of Systems     Objective:   Physical Exam   Healthy appearing female, no provider needed for visit, injection only.     Assessment & Plan:   Meds ordered this encounter  Medications  . cyanocobalamin ((VITAMIN B-12)) injection 1,000 mcg    Sig:

## 2013-08-15 NOTE — Telephone Encounter (Signed)
Sent in Vitamin B12 to her pharmacy the nurse at [facility] once patient gets injection, the nurse will administer

## 2013-08-15 NOTE — Telephone Encounter (Signed)
Patient came in for her B12 injection, she states she feels better, but feels as if she does need something for her depression, please advise, Dr Brigitte Pulse is out of the office.  She uses [facility] because [facility] deliver, I told her I will call her when we send something in, please send message back to me, instead of the pool. Thanks.

## 2013-08-15 NOTE — Telephone Encounter (Signed)
Needs an OV before we can start a new depression medication

## 2013-08-15 NOTE — Addendum Note (Signed)
Addended byCandice Camp on: 08/15/2013 09:46 AM   Modules accepted: Level of Service

## 2013-08-16 NOTE — Telephone Encounter (Signed)
Thanks called patient, no answer. Will advise needs appointment. Have sent in the B12 for her to get monthly injections.

## 2013-08-23 NOTE — Telephone Encounter (Signed)
Patient will continue the B12, and will discuss with Dr Brigitte Pulse next visit.

## 2013-09-14 ENCOUNTER — Other Ambulatory Visit: Payer: Self-pay | Admitting: Family Medicine

## 2013-12-06 ENCOUNTER — Encounter: Payer: Self-pay | Admitting: Family Medicine

## 2013-12-06 ENCOUNTER — Ambulatory Visit (INDEPENDENT_AMBULATORY_CARE_PROVIDER_SITE_OTHER): Payer: Medicare Other | Admitting: Family Medicine

## 2013-12-06 VITALS — BP 131/68 | HR 75 | Temp 98.8°F | Resp 16 | Ht 62.5 in | Wt 175.0 lb

## 2013-12-06 DIAGNOSIS — R059 Cough, unspecified: Secondary | ICD-10-CM

## 2013-12-06 DIAGNOSIS — Q765 Cervical rib: Secondary | ICD-10-CM

## 2013-12-06 DIAGNOSIS — R413 Other amnesia: Secondary | ICD-10-CM | POA: Diagnosis not present

## 2013-12-06 DIAGNOSIS — Z79899 Other long term (current) drug therapy: Secondary | ICD-10-CM

## 2013-12-06 DIAGNOSIS — F39 Unspecified mood [affective] disorder: Secondary | ICD-10-CM

## 2013-12-06 DIAGNOSIS — K6289 Other specified diseases of anus and rectum: Secondary | ICD-10-CM | POA: Diagnosis not present

## 2013-12-06 DIAGNOSIS — E538 Deficiency of other specified B group vitamins: Secondary | ICD-10-CM | POA: Diagnosis not present

## 2013-12-06 DIAGNOSIS — E871 Hypo-osmolality and hyponatremia: Secondary | ICD-10-CM

## 2013-12-06 DIAGNOSIS — R5383 Other fatigue: Secondary | ICD-10-CM

## 2013-12-06 DIAGNOSIS — R32 Unspecified urinary incontinence: Secondary | ICD-10-CM

## 2013-12-06 DIAGNOSIS — E119 Type 2 diabetes mellitus without complications: Secondary | ICD-10-CM | POA: Diagnosis not present

## 2013-12-06 DIAGNOSIS — R5381 Other malaise: Secondary | ICD-10-CM

## 2013-12-06 DIAGNOSIS — E785 Hyperlipidemia, unspecified: Secondary | ICD-10-CM

## 2013-12-06 DIAGNOSIS — E559 Vitamin D deficiency, unspecified: Secondary | ICD-10-CM | POA: Diagnosis not present

## 2013-12-06 DIAGNOSIS — I10 Essential (primary) hypertension: Secondary | ICD-10-CM

## 2013-12-06 DIAGNOSIS — R05 Cough: Secondary | ICD-10-CM

## 2013-12-06 DIAGNOSIS — F338 Other recurrent depressive disorders: Secondary | ICD-10-CM

## 2013-12-06 LAB — POCT URINALYSIS DIPSTICK
Bilirubin, UA: NEGATIVE
Glucose, UA: NEGATIVE
Ketones, UA: NEGATIVE
Nitrite, UA: NEGATIVE
Protein, UA: NEGATIVE
Spec Grav, UA: 1.02
Urobilinogen, UA: 0.2
pH, UA: 5

## 2013-12-06 LAB — POCT GLYCOSYLATED HEMOGLOBIN (HGB A1C): Hemoglobin A1C: 6.4

## 2013-12-06 LAB — POCT UA - MICROSCOPIC ONLY
Casts, Ur, LPF, POC: NEGATIVE
Crystals, Ur, HPF, POC: NEGATIVE
Mucus, UA: POSITIVE
Yeast, UA: NEGATIVE

## 2013-12-06 MED ORDER — OXYBUTYNIN CHLORIDE ER 10 MG PO TB24: 10.0000 mg | ORAL_TABLET | Freq: Every day | ORAL | Status: DC

## 2013-12-06 MED ORDER — BENZONATATE 100 MG PO CAPS: 100.0000 mg | ORAL_CAPSULE | Freq: Three times a day (TID) | ORAL | Status: DC | PRN

## 2013-12-06 MED ORDER — SERTRALINE HCL 25 MG PO TABS: 25.0000 mg | ORAL_TABLET | Freq: Every day | ORAL | Status: DC

## 2013-12-06 NOTE — Progress Notes (Signed)
Subjective:    Patient ID: Catherine Ellis, female    DOB: 06-01-1922, 80 y.o.   MRN: [id number]  Chief Complaint  Patient presents with  . Follow-up    also been  having burning between legs  . Urinary Incontinence    @ night  . Cough    dry   HPI This chart was scribed for Shawnee Knapp, MD by Ladene Artist, ED Scribe. The patient was seen in room 22. Patient's care was started at 1:59 PM.  HPI Comments: Catherine Ellis is a 80 y.o. female, with a h/o Dm, HTN, hyperlipidemia, who presents to the [facility] for follow-up.   Pt tried B-12 injections but reports feeling ill the day afterwards. Next injection is 11/08/13. She does not report improvement with memory; she has difficulty remembering names.   She admits to not doing exercises instructed.   Pt still reports urinary incontinence at night. Pt states that she tends to go to the restroom often during the day to avoid incontinence, although she does reports rare incidents during the day.   Pt still reports burning and rawness of the thin genital skin between her legs, worse on the L, and at the rectum. She has applied Aquaphor several times/d with mild relief.   Pt states that [facility] checks her BP occasionally. Pt also checks her own BP but she does not recall the average readings. She denies light-headedness, dizziness.  Pt also reports cough that is typically dry but she does report occasional white colored phlegm. She also reports associated postnasal drip and congestion. Pt denies prior use of Tessalon Perles.  It really doesn't bother her at all until she is in church. She would prefer to not take a daily medication for prevention but would like to have something to stop the cough as needed.  Pt reports heartburn that is worse at night. She purchased antiacid last week with relief.    Pt reports sleeping more than usual. She states "I sleep more than the normal person".   Pt has never been  married and is living at [facility]. She does not drive.  Her 36 yo brother passed away 11-13-2013. She states that she picks up the phone to call him often, but then she realizes that he is no longer alive.  Pt expresses her desire to try medication for depression short-term. Pt has no h/o of similar medication.  Past Medical History  Diagnosis Date  . Arthritis   . Diabetes mellitus   . Hypertension   . Hyperlipidemia   . Allergy   . Osteopenia, senile 09/26/2012    T score - 1.6 Left femur   Current Outpatient Prescriptions on File Prior to Visit  Medication Sig Dispense Refill  . cyanocobalamin (,VITAMIN B-12,) 1000 MCG/ML injection Inject 1 mL (1,000 mcg total) into the muscle once.  1 mL  3  . ergocalciferol (VITAMIN D2) 50000 UNITS capsule Take 1 capsule (50,000 Units total) by mouth once a week.  4 capsule  2  . fish oil-omega-3 fatty acids 1000 MG capsule Take 2 g by mouth daily.      Marland Kitchen lisinopril (PRINIVIL,ZESTRIL) 10 MG tablet TAKE 1 TABLET (10 MG TOTAL) BY MOUTH DAILY.  90 tablet  2  . metFORMIN (GLUCOPHAGE) 500 MG tablet Take 1 tablet (500 mg total) by mouth 2 (two) times daily with a meal.  180 tablet  2  . OVER THE COUNTER MEDICATION Vit D 3  5000 iu taking daily       Current Facility-Administered Medications on File Prior to Visit  Medication Dose Route Frequency Provider Last Rate Last Dose  . cyanocobalamin ((VITAMIN B-12)) injection 1,000 mcg  1,000 mcg Intramuscular Q30 days Robyn Haber, MD       No Known Allergies  Review of Systems  Constitutional: Positive for fatigue. Negative for fever, chills, activity change, appetite change and unexpected weight change.  HENT: Positive for congestion and postnasal drip.   Respiratory: Positive for cough. Negative for chest tightness, shortness of breath and wheezing.   Cardiovascular: Negative for chest pain.  Gastrointestinal: Negative for vomiting.  Genitourinary: Positive for urgency, frequency and enuresis.  Negative for dysuria, decreased urine volume and difficulty urinating.  Skin: Positive for color change, rash and wound.  Neurological: Positive for weakness. Negative for dizziness and light-headedness.  Psychiatric/Behavioral: Positive for sleep disturbance and dysphoric mood. The patient is not nervous/anxious.      BP 131/68  Pulse 75  Temp(Src) 98.8 F (37.1 C) (Oral)  Resp 16  Ht 5' 2.5" (1.588 m)  Wt 175 lb (79.379 kg)  BMI 31.48 kg/m2  SpO2 96% Objective:   Physical Exam  Nursing note and vitals reviewed. Constitutional: She is oriented to person, place, and time. She appears well-developed and well-nourished. No distress.  HENT:  Head: Normocephalic and atraumatic.  Right Ear: Tympanic membrane normal.  Left Ear: Tympanic membrane normal.  Nose: Nose normal.  Mouth/Throat: Oropharynx is clear and moist and mucous membranes are normal.  Eyes: EOM are normal.  Neck: Neck supple.  Cardiovascular:  Murmur heard.  Systolic (LUSB) murmur is present with a grade of 1/6  Pulmonary/Chest: Effort normal and breath sounds normal. No respiratory distress.  Clear to auscultation bilaterally  Musculoskeletal: Normal range of motion.  Lymphadenopathy:    She has no cervical adenopathy.  Neurological: She is alert and oriented to person, place, and time.  Skin: Skin is warm and dry.  Psychiatric: She has a normal mood and affect. Her behavior is normal.      Results for orders placed in visit on 12/06/13  URINE CULTURE      Result Value Ref Range   Colony Count 30,000 COLONIES/ML     Organism ID, Bacteria Multiple bacterial morphotypes present, none     Organism ID, Bacteria predominant. Suggest appropriate recollection if      Organism ID, Bacteria clinically indicated.    COMPREHENSIVE METABOLIC PANEL      Result Value Ref Range   Sodium 139  135 - 145 mEq/L   Potassium 4.5  3.5 - 5.3 mEq/L   Chloride 102  96 - 112 mEq/L   CO2 22  19 - 32 mEq/L   Glucose, Bld 111 (*) 70  - 99 mg/dL   BUN 13  6 - 23 mg/dL   Creat 0.69  0.50 - 1.10 mg/dL   Total Bilirubin 0.8  0.2 - 1.2 mg/dL   Alkaline Phosphatase 62  39 - 117 U/L   AST 19  0 - 37 U/L   ALT 17  0 - 35 U/L   Total Protein 7.7  6.0 - 8.3 g/dL   Albumin 4.1  3.5 - 5.2 g/dL   Calcium 9.8  8.4 - 10.5 mg/dL  LIPID PANEL      Result Value Ref Range   Cholesterol 236 (*) 0 - 200 mg/dL   Triglycerides 249 (*) <150 mg/dL   HDL 52  >39 mg/dL   Total  CHOL/HDL Ratio 4.5     VLDL 50 (*) 0 - 40 mg/dL   LDL Cholesterol 134 (*) 0 - 99 mg/dL  VITAMIN D 25 HYDROXY      Result Value Ref Range   Vit D, 25-Hydroxy 42  30 - 89 ng/mL  VITAMIN B12      Result Value Ref Range   Vitamin B-12 401  211 - 911 pg/mL  POCT GLYCOSYLATED HEMOGLOBIN (HGB A1C)      Result Value Ref Range   Hemoglobin A1C 6.4    POCT UA - MICROSCOPIC ONLY      Result Value Ref Range   WBC, Ur, HPF, POC 6-9     RBC, urine, microscopic 4-5     Bacteria, U Microscopic 1+     Mucus, UA pos     Epithelial cells, urine per micros 0-2     Crystals, Ur, HPF, POC neg     Casts, Ur, LPF, POC neg     Yeast, UA neg    POCT URINALYSIS DIPSTICK      Result Value Ref Range   Color, UA yellow     Clarity, UA clear     Glucose, UA neg     Bilirubin, UA neg     Ketones, UA neg     Spec Grav, UA 1.020     Blood, UA small     pH, UA 5.0     Protein, UA neg     Urobilinogen, UA 0.2     Nitrite, UA neg     Leukocytes, UA Trace     Assessment & Plan:   Vitamin B12 deficiency - Plan: Vitamin B12 - ok to stop monthly b12 injections as she has flu-like sxs as side effect without any clinical benefit.  Unspecified vitamin D deficiency - Plan: Vit D  25 hydroxy (rtn osteoporosis monitoring)  Seasonal affective disorder - Pt would like to start medication for depression. Has never tried any prior so will start low dose zoloft. Reassess at f/u OV in 2-3 mos and consider weaning off in 6 mos.  Other malaise and fatigue  Other and unspecified hyperlipidemia  - Plan: Lipid panel  Memory loss - mild and normal for 89 y0.  Incontinence of urine - Plan: POCT UA - Microscopic Only, POCT urinalysis dipstick, Urine culture - sounds like urge incontinence. Start kegels and try ditropan xl.  Hyponatremia  Hypertension  Encounter for long-term (current) use of other medications - Plan: Comprehensive metabolic panel  Diabetes mellitus - Plan: POCT glycosylated hemoglobin (Hb A1C) - well controlled w/ a1c 6.4 today  Anal irritation - rec vaseline  Cough - try prn tessalon pearles - could be from reflux or post-nasal drip. Pt also on acei which could be cause but cough not bothering pt enough for her to want to try to treat these things.  Meds ordered this encounter  Medications  . benzonatate (TESSALON) 100 MG capsule    Sig: Take 1-2 capsules (100-200 mg total) by mouth 3 (three) times daily as needed for cough.    Dispense:  40 capsule    Refill:  1  . oxybutynin (DITROPAN XL) 10 MG 24 hr tablet    Sig: Take 1 tablet (10 mg total) by mouth at bedtime.    Dispense:  30 tablet    Refill:  4  . sertraline (ZOLOFT) 25 MG tablet    Sig: Take 1 tablet (25 mg total) by mouth daily.    Dispense:  30 tablet    Refill:  4    I personally performed the services described in this documentation, which was scribed in my presence. The recorded information has been reviewed and considered, and addended by me as needed.  Delman Cheadle, MD MPH

## 2013-12-06 NOTE — Patient Instructions (Signed)
The aquaphor that you are using for the thin sore skin around your pelvic area is perfect but you may also benefit from alternating with Vaseline - especially Vaseline around your rectum could provide more relief.  Kegel Exercises The goal of Kegel exercises is to isolate and exercise your pelvic floor muscles. These muscles act as a hammock that supports the rectum, vagina, small intestine, and uterus. As the muscles weaken, the hammock sags and these organs are displaced from their normal positions. Kegel exercises can strengthen your pelvic floor muscles and help you to improve bladder and bowel control, improve sexual response, and help reduce many problems and some discomfort during pregnancy. Kegel exercises can be done anywhere and at any time. HOW TO PERFORM KEGEL EXERCISES 1. Locate your pelvic floor muscles. To do this, squeeze (contract) the muscles that you use when you try to stop the flow of urine. You will feel a tightness in the vaginal area (women) and a tight lift in the rectal area (men and women). 2. When you begin, contract your pelvic muscles tight for 2 5 seconds, then relax them for 2 5 seconds. This is one set. Do 4 5 sets with a short pause in between. 3. Contract your pelvic muscles for 8 10 seconds, then relax them for 8 10 seconds. Do 4 5 sets. If you cannot contract your pelvic muscles for 8 10 seconds, try 5 7 seconds and work your way up to 8 10 seconds. Your goal is 4 5 sets of 10 contractions each day. Keep your stomach, buttocks, and legs relaxed during the exercises. Perform sets of both short and long contractions. Vary your positions. Perform these contractions 3 4 times per day. Perform sets while you are:   Lying in bed in the morning.  Standing at lunch.  Sitting in the late afternoon.  Lying in bed at night. You should do 40 50 contractions per day. Do not perform more Kegel exercises per day than recommended. Overexercising can cause muscle fatigue.  Continue these exercises for for at least 15 20 weeks or as directed by your caregiver. Document Released: 06/13/2012 Document Reviewed: 06/13/2012 [facility] Patient Information 2014 [place], [place].

## 2013-12-07 LAB — COMPREHENSIVE METABOLIC PANEL
ALT: 17 U/L (ref 0–35)
AST: 19 U/L (ref 0–37)
Albumin: 4.1 g/dL (ref 3.5–5.2)
Alkaline Phosphatase: 62 U/L (ref 39–117)
BUN: 13 mg/dL (ref 6–23)
CO2: 22 mEq/L (ref 19–32)
Calcium: 9.8 mg/dL (ref 8.4–10.5)
Chloride: 102 mEq/L (ref 96–112)
Creat: 0.69 mg/dL (ref 0.50–1.10)
Glucose, Bld: 111 mg/dL — ABNORMAL HIGH (ref 70–99)
Potassium: 4.5 mEq/L (ref 3.5–5.3)
Sodium: 139 mEq/L (ref 135–145)
Total Bilirubin: 0.8 mg/dL (ref 0.2–1.2)
Total Protein: 7.7 g/dL (ref 6.0–8.3)

## 2013-12-07 LAB — LIPID PANEL
Cholesterol: 236 mg/dL — ABNORMAL HIGH (ref 0–200)
HDL: 52 mg/dL (ref >39)
LDL Cholesterol: 134 mg/dL — ABNORMAL HIGH (ref 0–99)
Total CHOL/HDL Ratio: 4.5 Ratio
Triglycerides: 249 mg/dL — ABNORMAL HIGH (ref <150)
VLDL: 50 mg/dL — ABNORMAL HIGH (ref 0–40)

## 2013-12-07 LAB — VITAMIN D 25 HYDROXY (VIT D DEFICIENCY, FRACTURES): Vit D, 25-Hydroxy: 42 ng/mL (ref 30–89)

## 2013-12-07 LAB — VITAMIN B12: Vitamin B-12: 401 pg/mL (ref 211–911)

## 2013-12-08 ENCOUNTER — Encounter: Payer: Self-pay | Admitting: Family Medicine

## 2013-12-08 LAB — URINE CULTURE: Colony Count: 30000

## 2013-12-15 ENCOUNTER — Telehealth: Payer: Self-pay

## 2013-12-15 NOTE — Telephone Encounter (Signed)
Pt called saying that she has bad diarrhea and thinks it's from one of the meds we just put her on. Wants to know if she should stop any of them.

## 2013-12-16 NOTE — Telephone Encounter (Signed)
Sertraline could cause diarrhea.  If caused by sertraline this will usually lessen with continued use.  If she cannot tolerate this, she can stop the sertraline.  If diarrhea is worsening or associated with other symptoms, recommend she RTC

## 2013-12-16 NOTE — Telephone Encounter (Signed)
Pt called to check status of message

## 2013-12-16 NOTE — Telephone Encounter (Signed)
Called and spoke with patient. She stopped both medications, sertraline and ditropan, and still has been having diarrhea and nausea. She was advised to RTC for evaluation. Dr. Brigitte Pulse is her PCP and she was given next few days and times. She will speak to [facility] transportation to see when they can bring her.

## 2013-12-17 ENCOUNTER — Ambulatory Visit: Payer: Medicare Other

## 2013-12-17 ENCOUNTER — Ambulatory Visit (INDEPENDENT_AMBULATORY_CARE_PROVIDER_SITE_OTHER): Payer: Medicare Other | Admitting: Family Medicine

## 2013-12-17 VITALS — BP 127/85 | HR 75 | Temp 98.1°F | Resp 18 | Ht 62.0 in | Wt 169.0 lb

## 2013-12-17 DIAGNOSIS — R197 Diarrhea, unspecified: Secondary | ICD-10-CM | POA: Diagnosis not present

## 2013-12-17 DIAGNOSIS — E860 Dehydration: Secondary | ICD-10-CM

## 2013-12-17 DIAGNOSIS — I10 Essential (primary) hypertension: Secondary | ICD-10-CM

## 2013-12-17 DIAGNOSIS — R109 Unspecified abdominal pain: Secondary | ICD-10-CM | POA: Diagnosis not present

## 2013-12-17 DIAGNOSIS — E119 Type 2 diabetes mellitus without complications: Secondary | ICD-10-CM | POA: Diagnosis not present

## 2013-12-17 DIAGNOSIS — Z79899 Other long term (current) drug therapy: Secondary | ICD-10-CM

## 2013-12-17 LAB — POCT UA - MICROSCOPIC ONLY
Casts, Ur, LPF, POC: NEGATIVE
Crystals, Ur, HPF, POC: NEGATIVE
Mucus, UA: POSITIVE
Yeast, UA: NEGATIVE

## 2013-12-17 LAB — POCT URINALYSIS DIPSTICK
Bilirubin, UA: NEGATIVE
Glucose, UA: NEGATIVE
Ketones, UA: NEGATIVE
Nitrite, UA: NEGATIVE
Protein, UA: 30
Spec Grav, UA: >=1.03
Urobilinogen, UA: 0.2
pH, UA: 5.5

## 2013-12-17 LAB — POCT CBC
Granulocyte percent: 53.8 %G (ref 37–80)
HCT, POC: 44.9 % (ref 37.7–47.9)
Hemoglobin: 14.3 g/dL (ref 12.2–16.2)
Lymph, poc: 3.9 — AB (ref 0.6–3.4)
MCH, POC: 30.2 pg (ref 27–31.2)
MCHC: 31.8 g/dL (ref 31.8–35.4)
MCV: 94.8 fL (ref 80–97)
MID (cbc): 0.8 (ref 0–0.9)
MPV: 10.3 fL (ref 0–99.8)
POC Granulocyte: 5.5 (ref 2–6.9)
POC LYMPH PERCENT: 38 %L (ref 10–50)
POC MID %: 8.2 %M (ref 0–12)
Platelet Count, POC: 360 10*3/uL (ref 142–424)
RBC: 4.74 M/uL (ref 4.04–5.48)
RDW, POC: 14 %
WBC: 10.3 10*3/uL — AB (ref 4.6–10.2)

## 2013-12-17 NOTE — Patient Instructions (Signed)
When you are feeling completely back to normal, please call us and I am going to send in the bladder medication in immediate release - it will only last in your system for a few hours and we will have you try a dose some morning and if you don't have diarrhea after a day or two we will gradually increase the dose to twice a day and then three times a day. If you do ok on that then we will have you retry the ditropan XL that you were taking previously for the urinary frequency.  After we get you on urine medication and you are doing well, then we can retry a low dose of a completely different depression medication.  Dehydration, Elderly Dehydration is when you lose more fluids from the body than you take in. Vital organs such as the kidneys, brain, and heart cannot function without a proper amount of fluids and salt. Any loss of fluids from the body can cause dehydration.  Older adults are at a higher risk of dehydration than younger adults. As we age, our bodies are less able to conserve water and do not respond to temperature changes as well. Also, older adults do not become thirsty as easily or quickly. Because of this, older adults often do not realize they need to increase fluids to avoid dehydration.  CAUSES   Vomiting.  Diarrhea.  Excessive sweating.  Excessive urination.  Fever.  Certain medicines, such as blood pressure medicines called diuretics.  Poorly controlled blood sugars. SIGNS AND SYMPTOMS  Mild dehydration:  Thirst.  Dry lips.  Slightly dry mouth. Moderate dehydration:  Very dry mouth.  Sunken eyes.  Skin does not bounce back quickly when lightly pinched and released.  Dark urine and decreased urine production.  Decreased tear production.  Headache. Severe dehydration:  Very dry mouth.  Extreme thirst.  Rapid, weak pulse (more than 100 beats per minute at rest).  Cold hands and feet.  Not able to sweat in spite of heat.  Rapid breathing.  Blue  lips.  Confusion and lethargy.  Difficulty being awakened.  Minimal urine production.  No tears. DIAGNOSIS  Your health care provider will diagnose dehydration based on your symptoms and your exam. Blood and urine tests will help confirm the diagnosis. The diagnostic evaluation should also identify the cause of dehydration. TREATMENT  Treatment of mild or moderate dehydration can often be done at home by increasing the amount of fluids that you drink. It is best to drink small amounts of fluid more often. Drinking too much at one time can make vomiting worse. Severe dehydration needs to be treated at the hospital. You may be given IV fluids that contain water and electrolytes. HOME CARE INSTRUCTIONS   Ask your health care provider about specific rehydration instructions.  Drink enough fluids to keep your urine clear or pale yellow.  Drink small amounts frequently if you have nausea and vomiting.  Eat as you normally do.  Avoid:  Foods or drinks high in sugar.  Carbonated drinks.  Juice.  Extremely hot or cold fluids.  Drinks with caffeine.  Fatty, greasy foods.  Alcohol.  Tobacco.  Overeating.  Gelatin desserts.  Wash your hands well to avoid spreading bacteria and viruses.  Only take over-the-counter or prescription medicines for pain, discomfort, or fever as directed by your health care provider.  Ask your health care provider if you should continue all prescribed and over-the-counter medicines.  Keep all follow-up appointments with your health care provider.  SEEK MEDICAL CARE IF:  You have abdominal pain, and it increases or stays in one area (localizes).  You have a rash, stiff neck, or severe headache.  You are irritable, sleepy, or difficult to awaken.  You are weak, dizzy, or extremely thirsty. SEEK IMMEDIATE MEDICAL CARE IF:   You are unable to keep fluids down, or you get worse despite treatment.  You have frequent episodes of vomiting or  diarrhea.  You have blood or green matter (bile) in your vomit.  You have blood in your stool, or your stool looks black and tarry.  You have not urinated in 6 8 hours, or you have only urinated a small amount of very dark urine.  You have a fever.  You faint. MAKE SURE YOU:   Understand these instructions.  Will watch your condition.  Will get help right away if you are not doing well or get worse. Document Released: 09/17/2003 Document Revised: 04/17/2013 Document Reviewed: 03/04/2013 [facility] Patient Information 2014 [place].

## 2013-12-17 NOTE — Progress Notes (Addendum)
Subjective:  This patient's care was started at 11:21 AM.   Patient ID: Catherine Ellis, female    DOB: [birth year], 80 y.o.   MRN: [id number]  Chief Complaint  Patient presents with  . Diarrhea    from medication   HPI  HPI Comments:  HAZELL Ellis is a 80 y.o. female who presents to the Emergency Department complaining of diarrhea.  Stools getting back to normal now - does normally have more liquid stools.  Had decreased appetite and did vomit once on Fri after stopping the medication on Thursday night (4 nights). Still nuaseated - took some  tums but no other otc. Dry mouth, only voided once last night. Past Medical History  Diagnosis Date  . Arthritis   . Diabetes mellitus   . Hypertension   . Hyperlipidemia   . Allergy   . Osteopenia, senile 09/26/2012    T score - 1.6 Left femur   Current Outpatient Prescriptions on File Prior to Visit  Medication Sig Dispense Refill  . fish oil-omega-3 fatty acids 1000 MG capsule Take 2 g by mouth daily.      Marland Kitchen lisinopril (PRINIVIL,ZESTRIL) 10 MG tablet TAKE 1 TABLET (10 MG TOTAL) BY MOUTH DAILY.  90 tablet  2  . metFORMIN (GLUCOPHAGE) 500 MG tablet Take 1 tablet (500 mg total) by mouth 2 (two) times daily with a meal.  180 tablet  2  . OVER THE COUNTER MEDICATION Vit D 3 5000 iu taking daily      . oxybutynin (DITROPAN XL) 10 MG 24 hr tablet Take 1 tablet (10 mg total) by mouth at bedtime.  30 tablet  4  . sertraline (ZOLOFT) 25 MG tablet Take 1 tablet (25 mg total) by mouth daily.  30 tablet  4  . benzonatate (TESSALON) 100 MG capsule Take 1-2 capsules (100-200 mg total) by mouth 3 (three) times daily as needed for cough.  40 capsule  1   No current facility-administered medications on file prior to visit.    No Known Allergies  Review of Systems  Constitutional: Negative for fever.  Gastrointestinal: Positive for diarrhea.    Triage Vitals: BP 127/85  Pulse 75  Temp(Src) 98.1 F (36.7 C) (Oral)  Resp 18  Ht 5\' 2"   (1.575 m)  Wt 169 lb (76.658 kg)  BMI 30.90 kg/m2  SpO2 97%    Objective:   Physical Exam  Nursing note and vitals reviewed. Constitutional: She is oriented to person, place, and time. She appears well-developed and well-nourished. No distress.  HENT:  Head: Normocephalic and atraumatic.  Eyes: Conjunctivae and EOM are normal.  Neck: Normal range of motion. No tracheal deviation present.  Cardiovascular: Normal rate.   Pulmonary/Chest: Effort normal. No respiratory distress.  Musculoskeletal: Normal range of motion.  Neurological: She is alert and oriented to person, place, and time.  Skin: Skin is warm and dry.  Psychiatric: She has a normal mood and affect. Her behavior is normal.   Results for orders placed in visit on 12/17/13  POCT CBC      Result Value Ref Range   WBC 10.3 (*) 4.6 - 10.2 K/uL   Lymph, poc 3.9 (*) 0.6 - 3.4   POC LYMPH PERCENT 38.0  10 - 50 %L   MID (cbc) 0.8  0 - 0.9   POC MID % 8.2  0 - 12 %M   POC Granulocyte 5.5  2 - 6.9   Granulocyte percent 53.8  37 - 80 %G  RBC 4.74  4.04 - 5.48 M/uL   Hemoglobin 14.3  12.2 - 16.2 g/dL   HCT, POC 44.9  37.7 - 47.9 %   MCV 94.8  80 - 97 fL   MCH, POC 30.2  27 - 31.2 pg   MCHC 31.8  31.8 - 35.4 g/dL   RDW, POC 14.0     Platelet Count, POC 360  142 - 424 K/uL   MPV 10.3  0 - 99.8 fL  POCT UA - MICROSCOPIC ONLY      Result Value Ref Range   WBC, Ur, HPF, POC 10-15     RBC, urine, microscopic 5-10     Bacteria, U Microscopic 1+     Mucus, UA positive     Epithelial cells, urine per micros 3-5     Crystals, Ur, HPF, POC neg     Casts, Ur, LPF, POC neg     Yeast, UA neg    POCT URINALYSIS DIPSTICK      Result Value Ref Range   Color, UA dark yellow     Clarity, UA cloudy     Glucose, UA neg     Bilirubin, UA neg     Ketones, UA neg     Spec Grav, UA >=1.030     Blood, UA moderate     pH, UA 5.5     Protein, UA 30     Urobilinogen, UA 0.2     Nitrite, UA neg     Leukocytes, UA small (1+)      [facility]  reading (PRIMARY) by  Dr. Brigitte Pulse. Abd xray: normal chest, nonspecific bowel gas pattern, no acute findings.    CLINICAL DATA: Diarrhea, nausea, abdominal pain  EXAM: ACUTE ABDOMEN SERIES (ABDOMEN 2 VIEW & CHEST 1 VIEW)  COMPARISON: None.  FINDINGS: Normal heart size and vascularity. Lungs remain clear. No focal pneumonia, collapse or consolidation. No edema, effusion or pneumothorax. Trachea midline. Atherosclerosis of the aorta. Degenerative changes of the spine. No free air evident. Normal bowel gas pattern without dilatation, obstruction or ileus. Degenerative changes of the spine with a lumbar levoscoliosis. Benign pelvic calcifications noted. Chronic degenerative changes of the symphysis pubis and hip joints. Aortoiliac vascular calcifications also present.  IMPRESSION: No acute process in the chest or abdomen.  Chronic degenerative changes of the spine, pelvis and hips.  Aortic atherosclerosis   Assessment & Plan:  Abdominal pain - Plan: DG Abd Acute W/Chest, POCT CBC, POCT UA - Microscopic Only, POCT urinalysis dipstick  Diarrhea - Plan: DG Abd Acute W/Chest, POCT CBC, POCT UA - Microscopic Only, POCT urinalysis dipstick  Dehydration  Diabetes mellitus  Hypertension  Encounter for long-term (current) use of other medications  Unknown etiology on pt's diarrhea but seems likely to be side effect to either the ditropan xl or the zoloft from the timing of onset after starting these. However, pt does live in an assisted living home so high possibility for GI bug to be passed around or caught from there as well.  Therefore, will have pt stop both new meds until diarrhea completely resolves.  Then call us and I will start her on short acting oxybutynin 5 - she can gradually increase the amount she is taking this during the day and if she remains w/o diarrhea she can retry her ditropan xl.  Once we have pt stable on bladder med (or figured out that she can't tolerate it),  recommend retrying low dose of other ssri.  If diarrhea cont, RTC  for stool testing.  Delman Cheadle, MD MPH

## 2013-12-20 ENCOUNTER — Telehealth: Payer: Self-pay

## 2013-12-20 NOTE — Telephone Encounter (Signed)
Pt would like to speak with Dr. Brigitte Pulse: said she was told Brigitte Pulse would call her in the next two day about her medication, but has not heard anything

## 2013-12-25 ENCOUNTER — Telehealth: Payer: Self-pay

## 2013-12-25 NOTE — Telephone Encounter (Signed)
Pt having anxiety Dr Brigitte Pulse was suppose to call in a RX for her. She can be reached at [phone]. Thank you

## 2013-12-25 NOTE — Telephone Encounter (Signed)
Patient states that she has been having trouble going to the bathroom and Dr. Brigitte Pulse gave her a medication to help her go.  She said the medication is causing her to have diarrhea and that DR Brigitte Pulse said she could reduce the dosage if it was too high. Patient said to disregard the message about the anxiety for now.  Her top concern is the diarrhea. I told her Dr. Brigitte Pulse is out of the office until tomorrow and she said that will be fine. Please advise.

## 2013-12-26 ENCOUNTER — Telehealth: Payer: Self-pay | Admitting: *Deleted

## 2013-12-26 MED ORDER — OXYBUTYNIN CHLORIDE 5 MG PO TABS: 2.5000 mg | ORAL_TABLET | Freq: Three times a day (TID) | ORAL | Status: DC | PRN

## 2013-12-26 NOTE — Telephone Encounter (Signed)
Thank you Catherine Ellis!!! You are SO helpful.  Oxybutynin sent to pharmacy.  If pt does well on this w/o diarrhea she can then retry the ditropan XL which she was on prior.  After she is stable on that, then we can retry a mood medicine (like celexa) for her derpession and anxiety - in sev wks.  Med sent to pharmacy.

## 2013-12-26 NOTE — Telephone Encounter (Signed)
Disccused w/ Catherine Ellis. She will call pt to see if still having diarrhea - if so needs to be seen. If diarrhea is resolved, will call in some IR oxybutynin for pt to try (to treat her urinary incontenence and frequency) to ensure that was not the cause of her piror diarrhea

## 2013-12-26 NOTE — Telephone Encounter (Signed)
Dr Brigitte Pulse asked me to call pt to find out if pt still has diarrhea. If this has resolved we will call in some short acting oxybutynin. If diarrhea has not resolved pt needs to be seen.

## 2013-12-26 NOTE — Telephone Encounter (Signed)
Pt is not having diarrhea any longer. She has stopped taking the medication. She is interested in the IR oxybutynin. She would like it called into [facility]. Advised her to give [place] a call if the diarrhea returns. Pt understands.

## 2013-12-26 NOTE — Telephone Encounter (Signed)
Called patient and advised her that oxybutynin was sent to pharmacy.  If she does well on this without diarrhea then she can retry the ditropan. Also advised her that she can try a mood medicine like celexa for depression and anxiety in several weeks.  Patient was very grateful.

## 2014-03-24 ENCOUNTER — Ambulatory Visit (INDEPENDENT_AMBULATORY_CARE_PROVIDER_SITE_OTHER): Payer: Medicare Other

## 2014-03-24 ENCOUNTER — Telehealth: Payer: Self-pay

## 2014-03-24 ENCOUNTER — Ambulatory Visit (INDEPENDENT_AMBULATORY_CARE_PROVIDER_SITE_OTHER): Payer: Medicare Other | Admitting: Family Medicine

## 2014-03-24 VITALS — BP 130/70 | HR 84 | Temp 97.7°F | Resp 16 | Ht 60.18 in | Wt 166.8 lb

## 2014-03-24 DIAGNOSIS — M5489 Other dorsalgia: Secondary | ICD-10-CM

## 2014-03-24 DIAGNOSIS — R5383 Other fatigue: Secondary | ICD-10-CM

## 2014-03-24 DIAGNOSIS — M549 Dorsalgia, unspecified: Secondary | ICD-10-CM

## 2014-03-24 DIAGNOSIS — E119 Type 2 diabetes mellitus without complications: Secondary | ICD-10-CM | POA: Diagnosis not present

## 2014-03-24 DIAGNOSIS — R5381 Other malaise: Secondary | ICD-10-CM

## 2014-03-24 LAB — POCT UA - MICROSCOPIC ONLY
Casts, Ur, LPF, POC: NEGATIVE
Crystals, Ur, HPF, POC: NEGATIVE
Mucus, UA: NEGATIVE
Yeast, UA: NEGATIVE

## 2014-03-24 LAB — COMPREHENSIVE METABOLIC PANEL
ALT: 19 U/L (ref 0–35)
AST: 23 U/L (ref 0–37)
Albumin: 4.5 g/dL (ref 3.5–5.2)
Alkaline Phosphatase: 52 U/L (ref 39–117)
BUN: 19 mg/dL (ref 6–23)
CO2: 25 mEq/L (ref 19–32)
Calcium: 9.9 mg/dL (ref 8.4–10.5)
Chloride: 99 mEq/L (ref 96–112)
Creat: 0.85 mg/dL (ref 0.50–1.10)
Glucose, Bld: 116 mg/dL — ABNORMAL HIGH (ref 70–99)
Potassium: 5 mEq/L (ref 3.5–5.3)
Sodium: 137 mEq/L (ref 135–145)
Total Bilirubin: 0.7 mg/dL (ref 0.2–1.2)
Total Protein: 7.9 g/dL (ref 6.0–8.3)

## 2014-03-24 LAB — POCT URINALYSIS DIPSTICK
Glucose, UA: NEGATIVE
Ketones, UA: 15
Nitrite, UA: NEGATIVE
Protein, UA: 100
Spec Grav, UA: 1.025
Urobilinogen, UA: 0.2
pH, UA: 5

## 2014-03-24 LAB — POCT CBC
Granulocyte percent: 64 %G (ref 37–80)
HCT, POC: 45.7 % (ref 37.7–47.9)
Hemoglobin: 15.3 g/dL (ref 12.2–16.2)
Lymph, poc: 3.9 — AB (ref 0.6–3.4)
MCH, POC: 30.5 pg (ref 27–31.2)
MCHC: 33.4 g/dL (ref 31.8–35.4)
MCV: 91.5 fL (ref 80–97)
MID (cbc): 0.8 (ref 0–0.9)
MPV: 8.5 fL (ref 0–99.8)
POC Granulocyte: 8.4 — AB (ref 2–6.9)
POC LYMPH PERCENT: 29.8 %L (ref 10–50)
POC MID %: 6.2 %M (ref 0–12)
Platelet Count, POC: 349 10*3/uL (ref 142–424)
RBC: 5 M/uL (ref 4.04–5.48)
RDW, POC: 14.6 %
WBC: 13.1 10*3/uL — AB (ref 4.6–10.2)

## 2014-03-24 LAB — GLUCOSE, POCT (MANUAL RESULT ENTRY): POC Glucose: 121 mg/dl — AB (ref 70–99)

## 2014-03-24 LAB — POCT GLYCOSYLATED HEMOGLOBIN (HGB A1C): Hemoglobin A1C: 6.2

## 2014-03-24 MED ORDER — CEPHALEXIN 500 MG PO CAPS: 500.0000 mg | ORAL_CAPSULE | Freq: Two times a day (BID) | ORAL | Status: DC

## 2014-03-24 NOTE — Progress Notes (Signed)
[facility] [address] [place] [address] [phone]  Date:  03/24/2014   Name:  Catherine Ellis   DOB:  February 21, 1922   MRN:  [id number]  PCP:  Delman Cheadle, MD    Chief Complaint: Urinary Tract Infection, Nausea and Oxygen level   History of Present Illness:  Catherine Ellis is a 80 y.o. very pleasant female patient who presents with the following:  She lives in [place].  Today is Monday.  Saturday night/ early yesterday she felt nauseated, developed diarrhea and then vomiting.  She saw a nurse this am at home who was concerned that she might have a UTI as she had pain on her left side.  She was also told that her O2 was low- however she seems to be doing ok now. (called and spoke with the nurse, Tiffany.  Actually her sat was fine but she was a bit tachycardic)   She does notice "phlegm in my throat all the time."  She denies any abdominal pain.  She has noted urinary frequency but otherwise no urinary sx She feels a little weak, and does have some cough.  The diarrhea and vomiting have resolved.  She was able to drink juice, and eat some celery and peanut butter earlier today.    Patient Active Problem List   Diagnosis Date Noted  . Unspecified vitamin D deficiency 08/10/2013  . Vitamin B12 deficiency 06/14/2013  . Seasonal affective disorder 06/14/2013  . Other malaise and fatigue 06/14/2013  . Hypertension 02/22/2013  . Other and unspecified hyperlipidemia 02/22/2013  . Hyponatremia 02/22/2013  . Memory loss 02/22/2013  . Diabetes mellitus 04/13/2012  . Encounter for long-term (current) use of other medications 04/13/2012  . Incontinence of urine 04/13/2012  . Anal irritation 04/13/2012    Past Medical History  Diagnosis Date  . Arthritis   . Diabetes mellitus   . Hypertension   . Hyperlipidemia   . Allergy   . Osteopenia, senile 09/26/2012    T score - 1.6 Left femur    Past Surgical History  Procedure Laterality Date  . Back  surgery      1965  . Knee surgery    . Appendectomy    . Eye surgery      History  Substance Use Topics  . Smoking status: Never Smoker   . Smokeless tobacco: Not on file  . Alcohol Use: No    Family History  Problem Relation Age of Onset  . Heart disease Father     No Known Allergies  Medication list has been reviewed and updated.  Current Outpatient Prescriptions on File Prior to Visit  Medication Sig Dispense Refill  . benzonatate (TESSALON) 100 MG capsule Take 1-2 capsules (100-200 mg total) by mouth 3 (three) times daily as needed for cough.  40 capsule  1  . fish oil-omega-3 fatty acids 1000 MG capsule Take 2 g by mouth daily.      Marland Kitchen lisinopril (PRINIVIL,ZESTRIL) 10 MG tablet TAKE 1 TABLET (10 MG TOTAL) BY MOUTH DAILY.  90 tablet  2  . metFORMIN (GLUCOPHAGE) 500 MG tablet Take 1 tablet (500 mg total) by mouth 2 (two) times daily with a meal.  180 tablet  2  . OVER THE COUNTER MEDICATION Vit D 3 5000 iu taking daily      . oxybutynin (DITROPAN) 5 MG tablet Take 0.5 tablets (2.5 mg total) by mouth every 8 (eight) hours as needed for bladder spasms.  [place]  tablet  0   No current facility-administered medications on file prior to visit.    Review of Systems:  As per HPI- otherwise negative.   Physical Examination: Filed Vitals:   03/24/14 1341  BP: 130/70  Pulse: 104  Temp: 97.7 F (36.5 C)  Resp: 16   Filed Vitals:   03/24/14 1341  Height: 5' 0.17" (1.528 m)  Weight: 166 lb 12.8 oz (75.66 kg)   Body mass index is 32.41 kg/(m^2). Ideal Body Weight: Weight in (lb) to have BMI = 25: 128.5  GEN: WDWN, NAD, Non-toxic, A & O x 3, appears stated age.   HEENT: Atraumatic, Normocephalic. Neck supple. No masses, No LAD.  Bilateral TM wnl, oropharynx normal.  PEERL,EOMI.   Ears and Nose: No external deformity. CV: RRR, No M/G/R. No JVD. No thrill. No extra heart sounds. PULM: CTA B, no wheezes, crackles, rhonchi. No retractions. No resp. distress. No accessory muscle  use. ABD: S, NT, ND EXTR: No c/c/e NEURO Normal gait.  PSYCH: Normally interactive. Conversant. Not depressed or anxious appearing.  Calm demeanor.    Results for orders placed in visit on 03/24/14  POCT URINALYSIS DIPSTICK      Result Value Ref Range   Color, UA yellow     Clarity, UA clear     Glucose, UA neg     Bilirubin, UA small     Ketones, UA 15     Spec Grav, UA 1.025     Blood, UA mod     pH, UA 5.0     Protein, UA 100     Urobilinogen, UA 0.2     Nitrite, UA neg     Leukocytes, UA large (3+)    POCT UA - MICROSCOPIC ONLY      Result Value Ref Range   WBC, Ur, HPF, POC 3-12     RBC, urine, microscopic 0-3     Bacteria, U Microscopic trace     Mucus, UA neg     Epithelial cells, urine per micros 0-2     Crystals, Ur, HPF, POC neg     Casts, Ur, LPF, POC neg     Yeast, UA neg    POCT CBC      Result Value Ref Range   WBC 13.1 (*) 4.6 - 10.2 K/uL   Lymph, poc 3.9 (*) 0.6 - 3.4   POC LYMPH PERCENT 29.8  10 - 50 %L   MID (cbc) 0.8  0 - 0.9   POC MID % 6.2  0 - 12 %M   POC Granulocyte 8.4 (*) 2 - 6.9   Granulocyte percent 64.0  37 - 80 %G   RBC 5.00  4.04 - 5.48 M/uL   Hemoglobin 15.3  12.2 - 16.2 g/dL   HCT, POC 45.7  37.7 - 47.9 %   MCV 91.5  80 - 97 fL   MCH, POC 30.5  27 - 31.2 pg   MCHC 33.4  31.8 - 35.4 g/dL   RDW, POC 14.6     Platelet Count, POC 349  142 - 424 K/uL   MPV 8.5  0 - 99.8 fL  GLUCOSE, POCT (MANUAL RESULT ENTRY)      Result Value Ref Range   POC Glucose 121 (*) 70 - 99 mg/dl  POCT GLYCOSYLATED HEMOGLOBIN (HGB A1C)      Result Value Ref Range   Hemoglobin A1C 6.2     [facility] reading (PRIMARY) by  Dr. Lorelei Pont. CXR:  Negative for  infiltrate   CHEST 2 VIEW  COMPARISON: 12/17/2013.  FINDINGS: Atherosclerotic aortic arch and descending thoracic aorta. Linear scarring or subsegmental atelectasis at the left lung base.  The lungs appear otherwise clear. Thoracic spondylosis is present.  IMPRESSION: 1. No specific abnormality to  account for the patient' s cough and fever identified. 2. Atherosclerosis and thoracic spondylosis, stable. 3. Linear subsegmental atelectasis or scarring at the left lung base.  Assessment and Plan: Other malaise and fatigue - Plan: Urine culture, POCT CBC, POCT glucose (manual entry), DG Chest 2 View  Midline back pain, unspecified location - Plan: POCT urinalysis dipstick, POCT UA - Microscopic Only, cephALEXin (KEFLEX) 500 MG capsule  Type II or unspecified type diabetes mellitus without mention of complication, not stated as uncontrolled - Plan: Comprehensive metabolic panel, POCT glycosylated hemoglobin (Hb A1C)  Possible UTI in an elderly lady.  Will start her on keflex while we await urine culture.  She wanted to go ahead and do her usual DM labs so we did these today.  She will let me know if she begins to feel bad again  Signed Lamar Blinks, MD

## 2014-03-24 NOTE — Patient Instructions (Signed)
Take the keflex antibiotic as directed for a possible UTI.  I will be in touch with the rest of your labs. If you start to feel worse again please let me know.

## 2014-03-25 LAB — URINE CULTURE: Colony Count: 25000

## 2014-03-28 ENCOUNTER — Ambulatory Visit: Payer: Medicare Other | Admitting: Family Medicine

## 2014-03-30 ENCOUNTER — Telehealth: Payer: Self-pay | Admitting: Family Medicine

## 2014-03-30 NOTE — Telephone Encounter (Signed)
Letter send out dated today will be mailed certified

## 2014-03-31 NOTE — Telephone Encounter (Signed)
Called her to go over her urine culture.  She just grew some mixed bacteria.  She feels better but "still not up to par," however no particular sx such as fever or abdominal pain. She is willing to come in for a recheck- will make her an appt and give her a call

## 2014-04-14 ENCOUNTER — Encounter: Payer: Self-pay | Admitting: Family Medicine

## 2014-04-14 ENCOUNTER — Ambulatory Visit (INDEPENDENT_AMBULATORY_CARE_PROVIDER_SITE_OTHER): Payer: Medicare Other | Admitting: Family Medicine

## 2014-04-14 VITALS — BP 120/70 | HR 78 | Temp 97.4°F | Resp 16 | Ht 62.25 in | Wt 169.6 lb

## 2014-04-14 DIAGNOSIS — D72829 Elevated white blood cell count, unspecified: Secondary | ICD-10-CM

## 2014-04-14 DIAGNOSIS — Z23 Encounter for immunization: Secondary | ICD-10-CM | POA: Diagnosis not present

## 2014-04-14 DIAGNOSIS — R3129 Other microscopic hematuria: Secondary | ICD-10-CM

## 2014-04-14 DIAGNOSIS — N390 Urinary tract infection, site not specified: Secondary | ICD-10-CM | POA: Diagnosis not present

## 2014-04-14 DIAGNOSIS — N3941 Urge incontinence: Secondary | ICD-10-CM

## 2014-04-14 DIAGNOSIS — R312 Other microscopic hematuria: Secondary | ICD-10-CM

## 2014-04-14 DIAGNOSIS — R8299 Other abnormal findings in urine: Secondary | ICD-10-CM | POA: Diagnosis not present

## 2014-04-14 DIAGNOSIS — R8271 Bacteriuria: Secondary | ICD-10-CM

## 2014-04-14 LAB — POCT URINALYSIS DIPSTICK
Bilirubin, UA: NEGATIVE
Glucose, UA: NEGATIVE
Ketones, UA: NEGATIVE
Nitrite, UA: NEGATIVE
Protein, UA: NEGATIVE
Spec Grav, UA: <=1.005
Urobilinogen, UA: 0.2
pH, UA: 5.5

## 2014-04-14 LAB — POCT UA - MICROSCOPIC ONLY
Casts, Ur, LPF, POC: NEGATIVE
Crystals, Ur, HPF, POC: NEGATIVE
Epithelial cells, urine per micros: NEGATIVE
Mucus, UA: NEGATIVE
Yeast, UA: NEGATIVE

## 2014-04-14 LAB — CBC
HCT: 40 % (ref 36.0–46.0)
Hemoglobin: 13.6 g/dL (ref 12.0–15.0)
MCH: 29.9 pg (ref 26.0–34.0)
MCHC: 34 g/dL (ref 30.0–36.0)
MCV: 87.9 fL (ref 78.0–100.0)
Platelets: 334 10*3/uL (ref 150–400)
RBC: 4.55 MIL/uL (ref 3.87–5.11)
RDW: 14 % (ref 11.5–15.5)
WBC: 8.1 10*3/uL (ref 4.0–10.5)

## 2014-04-14 NOTE — Progress Notes (Addendum)
[facility] [address] [place] [address] [phone]  Date:  04/14/2014   Name:  Catherine Ellis   DOB:  Jul 19, 1921   MRN:  [id number]  PCP:  Delman Cheadle, MD    Chief Complaint: Follow-up   History of Present Illness:  Catherine Ellis is a 80 y.o. very pleasant female patient who presents with the following:  Seen by myself about 3 weeks ago with malaise, diarrhea and vomiting.  We were concerned about a UTI.  However her urine culture grew just mixed bacteria.  Here today for a recheck.  She notes that overall she feels well, but her throat is dry and so are her hands.   She has not had any diarrhea or vomiting. In fact her stools can be hard some of the time.  She uses a diaper cream for itching in her groin, but no dysuria or hematuria.  She has urinary incontinence which has been a chronic issue.  She is on ditropan which does help but seems to cause some dry mouth  She feels better, but admits that she is sleepier than usual.  She is sleeping up to 14 hours a day.  Her appetite is good; however she sometimes does get indigestion at night.   Wt Readings from Last 3 Encounters:  04/14/14 169 lb 9.6 oz (76.93 kg)  03/24/14 166 lb 12.8 oz (75.66 kg)  12/17/13 169 lb (76.658 kg)     Patient Active Problem List   Diagnosis Date Noted  . Unspecified vitamin D deficiency 08/10/2013  . Vitamin B12 deficiency 06/14/2013  . Seasonal affective disorder 06/14/2013  . Other malaise and fatigue 06/14/2013  . Hypertension 02/22/2013  . Other and unspecified hyperlipidemia 02/22/2013  . Hyponatremia 02/22/2013  . Memory loss 02/22/2013  . Diabetes mellitus 04/13/2012  . Encounter for long-term (current) use of other medications 04/13/2012  . Incontinence of urine 04/13/2012  . Anal irritation 04/13/2012    Past Medical History  Diagnosis Date  . Arthritis   . Diabetes mellitus   . Hypertension   . Hyperlipidemia   . Allergy   . Osteopenia, senile  09/26/2012    T score - 1.6 Left femur    Past Surgical History  Procedure Laterality Date  . Back surgery      1965  . Knee surgery    . Appendectomy    . Eye surgery      History  Substance Use Topics  . Smoking status: Never Smoker   . Smokeless tobacco: Not on file  . Alcohol Use: No    Family History  Problem Relation Age of Onset  . Heart disease Father     No Known Allergies  Medication list has been reviewed and updated.  Current Outpatient Prescriptions on File Prior to Visit  Medication Sig Dispense Refill  . benzonatate (TESSALON) 100 MG capsule Take 1-2 capsules (100-200 mg total) by mouth 3 (three) times daily as needed for cough.  40 capsule  1  . fish oil-omega-3 fatty acids 1000 MG capsule Take 2 g by mouth daily.      Marland Kitchen lisinopril (PRINIVIL,ZESTRIL) 10 MG tablet TAKE 1 TABLET (10 MG TOTAL) BY MOUTH DAILY.  90 tablet  2  . metFORMIN (GLUCOPHAGE) 500 MG tablet Take 1 tablet (500 mg total) by mouth 2 (two) times daily with a meal.  180 tablet  2  . OVER THE COUNTER MEDICATION Vit D 3 5000 iu taking daily      .  oxybutynin (DITROPAN) 5 MG tablet Take 0.5 tablets (2.5 mg total) by mouth every 8 (eight) hours as needed for bladder spasms.  90 tablet  0   No current facility-administered medications on file prior to visit.    Review of Systems:  As per HPI- otherwise negative.   Physical Examination: Filed Vitals:   04/14/14 0916  BP: 120/70  Pulse: 78  Temp: 97.4 F (36.3 C)  Resp: 16   Filed Vitals:   04/14/14 0916  Height: 5' 2.25" (1.581 m)  Weight: 169 lb 9.6 oz (76.93 kg)   Body mass index is 30.78 kg/(m^2). Ideal Body Weight: Weight in (lb) to have BMI = 25: 137.5  GEN: WDWN, NAD, Non-toxic, A & O x 3, obese, looks well HEENT: Atraumatic, Normocephalic. Neck supple. No masses, No LAD. Ears and Nose: No external deformity. CV: RRR, No M/G/R. No JVD. No thrill. No extra heart sounds. PULM: CTA B, no wheezes, crackles, rhonchi. No  retractions. No resp. distress. No accessory muscle use. EXTR: No c/c/e NEURO Normal gait for age, slow but steady PSYCH: Normally interactive. Conversant. Not depressed or anxious appearing.  Calm demeanor.  External genital exam/ groin: no visible irritation or rash.    Results for orders placed in visit on 04/14/14  POCT UA - MICROSCOPIC ONLY      Result Value Ref Range   WBC, Ur, HPF, POC 0-1     RBC, urine, microscopic 2-5     Bacteria, U Microscopic trace     Mucus, UA neg     Epithelial cells, urine per micros neg     Crystals, Ur, HPF, POC neg     Casts, Ur, LPF, POC neg     Yeast, UA neg    POCT URINALYSIS DIPSTICK      Result Value Ref Range   Color, UA yellow     Clarity, UA clear     Glucose, UA neg     Bilirubin, UA neg     Ketones, UA neg     Spec Grav, UA <=1.005     Blood, UA trace     pH, UA 5.5     Protein, UA neg     Urobilinogen, UA 0.2     Nitrite, UA neg     Leukocytes, UA Trace     Assessment and Plan: Bacteria in urine - Plan: POCT UA - Microscopic Only, POCT urinalysis dipstick, Urine culture  Immunization due - Plan: Flu Vaccine QUAD 36+ mos IM  Leukocytosis - Plan: CBC  Urge incontinence of urine  Follow-up for UTI sx and malaise about 3 weeks ago.  Await urine culture and CBC.  She does have microhematuria- if culture negative will need to consider urology referral.   Will plan further follow- up pending labs.   Signed Lamar Blinks, MD  Received repeat urine culture- negative for infection.  Called her and discussed microhematuria.  She would like to go ahead and see urology for full evaluation.  Will make this referral for her. She will let us know if she does not hear in 1-2 weeks   Results for orders placed in visit on 04/14/14  URINE CULTURE      Result Value Ref Range   Colony Count 15,000 COLONIES/ML     Organism ID, Bacteria Multiple bacterial morphotypes present, none     Organism ID, Bacteria predominant. Suggest  appropriate recollection if      Organism ID, Bacteria clinically indicated.    CBC  Result Value Ref Range   WBC 8.1  4.0 - 10.5 K/uL   RBC 4.55  3.87 - 5.11 MIL/uL   Hemoglobin 13.6  12.0 - 15.0 g/dL   HCT 40.0  36.0 - 46.0 %   MCV 87.9  78.0 - 100.0 fL   MCH 29.9  26.0 - 34.0 pg   MCHC 34.0  30.0 - 36.0 g/dL   RDW 14.0  11.5 - 15.5 %   Platelets 334  150 - 400 K/uL  POCT UA - MICROSCOPIC ONLY      Result Value Ref Range   WBC, Ur, HPF, POC 0-1     RBC, urine, microscopic 2-5     Bacteria, U Microscopic trace     Mucus, UA neg     Epithelial cells, urine per micros neg     Crystals, Ur, HPF, POC neg     Casts, Ur, LPF, POC neg     Yeast, UA neg    POCT URINALYSIS DIPSTICK      Result Value Ref Range   Color, UA yellow     Clarity, UA clear     Glucose, UA neg     Bilirubin, UA neg     Ketones, UA neg     Spec Grav, UA <=1.005     Blood, UA trace     pH, UA 5.5     Protein, UA neg     Urobilinogen, UA 0.2     Nitrite, UA neg     Leukocytes, UA Trace

## 2014-04-14 NOTE — Patient Instructions (Signed)
I will give you a call when your urine and blood tests are back.  Take care!

## 2014-04-15 LAB — URINE CULTURE: Colony Count: 15000

## 2014-04-18 NOTE — Addendum Note (Signed)
Addended by: Lamar Blinks C on: 04/18/2014 01:52 PM   Modules accepted: Orders

## 2014-04-23 DIAGNOSIS — E119 Type 2 diabetes mellitus without complications: Secondary | ICD-10-CM | POA: Diagnosis not present

## 2014-04-23 DIAGNOSIS — H52203 Unspecified astigmatism, bilateral: Secondary | ICD-10-CM | POA: Diagnosis not present

## 2014-04-23 DIAGNOSIS — Z961 Presence of intraocular lens: Secondary | ICD-10-CM | POA: Diagnosis not present

## 2014-04-23 DIAGNOSIS — H3531 Nonexudative age-related macular degeneration: Secondary | ICD-10-CM | POA: Diagnosis not present

## 2014-04-23 LAB — HM DIABETES EYE EXAM

## 2014-04-27 ENCOUNTER — Encounter: Payer: Self-pay | Admitting: Physician Assistant

## 2014-06-02 DIAGNOSIS — R312 Other microscopic hematuria: Secondary | ICD-10-CM | POA: Diagnosis not present

## 2014-06-05 ENCOUNTER — Encounter: Payer: Self-pay | Admitting: Family Medicine

## 2014-06-05 DIAGNOSIS — R3129 Other microscopic hematuria: Secondary | ICD-10-CM | POA: Insufficient documentation

## 2014-06-06 ENCOUNTER — Ambulatory Visit (INDEPENDENT_AMBULATORY_CARE_PROVIDER_SITE_OTHER): Payer: Medicare Other | Admitting: Family Medicine

## 2014-06-06 VITALS — BP 142/72 | HR 74 | Temp 98.4°F | Resp 16

## 2014-06-06 DIAGNOSIS — R3581 Nocturnal polyuria: Secondary | ICD-10-CM

## 2014-06-06 DIAGNOSIS — E871 Hypo-osmolality and hyponatremia: Secondary | ICD-10-CM

## 2014-06-06 DIAGNOSIS — R413 Other amnesia: Secondary | ICD-10-CM | POA: Diagnosis not present

## 2014-06-06 DIAGNOSIS — I10 Essential (primary) hypertension: Secondary | ICD-10-CM

## 2014-06-06 DIAGNOSIS — R5382 Chronic fatigue, unspecified: Secondary | ICD-10-CM

## 2014-06-06 DIAGNOSIS — E118 Type 2 diabetes mellitus with unspecified complications: Secondary | ICD-10-CM

## 2014-06-06 DIAGNOSIS — J320 Chronic maxillary sinusitis: Secondary | ICD-10-CM

## 2014-06-06 DIAGNOSIS — R631 Polydipsia: Secondary | ICD-10-CM

## 2014-06-06 DIAGNOSIS — R42 Dizziness and giddiness: Secondary | ICD-10-CM | POA: Diagnosis not present

## 2014-06-06 DIAGNOSIS — R351 Nocturia: Secondary | ICD-10-CM | POA: Diagnosis not present

## 2014-06-06 DIAGNOSIS — Z9189 Other specified personal risk factors, not elsewhere classified: Secondary | ICD-10-CM | POA: Diagnosis not present

## 2014-06-06 DIAGNOSIS — R270 Ataxia, unspecified: Secondary | ICD-10-CM

## 2014-06-06 LAB — COMPREHENSIVE METABOLIC PANEL
ALT: 12 U/L (ref 0–35)
AST: 15 U/L (ref 0–37)
Albumin: 4.1 g/dL (ref 3.5–5.2)
Alkaline Phosphatase: 50 U/L (ref 39–117)
BUN: 13 mg/dL (ref 6–23)
CO2: 26 mEq/L (ref 19–32)
Calcium: 9.8 mg/dL (ref 8.4–10.5)
Chloride: 103 mEq/L (ref 96–112)
Creat: 0.83 mg/dL (ref 0.50–1.10)
Glucose, Bld: 108 mg/dL — ABNORMAL HIGH (ref 70–99)
Potassium: 4.5 mEq/L (ref 3.5–5.3)
Sodium: 137 mEq/L (ref 135–145)
Total Bilirubin: 0.4 mg/dL (ref 0.2–1.2)
Total Protein: 7.2 g/dL (ref 6.0–8.3)

## 2014-06-06 LAB — POCT CBC
Granulocyte percent: 68.4 %G (ref 37–80)
HCT, POC: 42.5 % (ref 37.7–47.9)
Hemoglobin: 14 g/dL (ref 12.2–16.2)
Lymph, poc: 3.2 (ref 0.6–3.4)
MCH, POC: 30.6 pg (ref 27–31.2)
MCHC: 32.9 g/dL (ref 31.8–35.4)
MCV: 93 fL (ref 80–97)
MID (cbc): 0.2 (ref 0–0.9)
MPV: 8.4 fL (ref 0–99.8)
POC Granulocyte: 7.5 — AB (ref 2–6.9)
POC LYMPH PERCENT: 29.5 %L (ref 10–50)
POC MID %: 2.1 %M (ref 0–12)
Platelet Count, POC: 293 10*3/uL (ref 142–424)
RBC: 4.56 M/uL (ref 4.04–5.48)
RDW, POC: 13.9 %
WBC: 11 10*3/uL — AB (ref 4.6–10.2)

## 2014-06-06 LAB — POCT SEDIMENTATION RATE: POCT SED RATE: 26 mm/hr — AB (ref 0–22)

## 2014-06-06 LAB — POCT GLYCOSYLATED HEMOGLOBIN (HGB A1C): Hemoglobin A1C: 6.1

## 2014-06-06 LAB — TSH: TSH: 1.465 u[IU]/mL (ref 0.350–4.500)

## 2014-06-06 MED ORDER — FLUTICASONE PROPIONATE 50 MCG/ACT NA SUSP: 2.0000 | Freq: Every day | NASAL | Status: DC

## 2014-06-06 MED ORDER — GUAIFENESIN ER 600 MG PO TB12: 600.0000 mg | ORAL_TABLET | Freq: Two times a day (BID) | ORAL | Status: DC

## 2014-06-06 NOTE — Patient Instructions (Signed)

## 2014-06-06 NOTE — Progress Notes (Addendum)
Subjective:  This chart was scribed for Delman Cheadle, MD by Dellis Filbert, ED Scribe at [facility].The patient was seen in exam room 06 and the patient's care was started at 12:04 PM.   Patient ID: Catherine Ellis, female    DOB: 1922-06-09, 80 y.o.   MRN: [id number] Chief Complaint  Patient presents with  . Hypertension    elevated this morning   . Dizziness   HPI  HPI Comments: Catherine Ellis is a 80 y.o. female with a history of HTN who presents to [facility] complaining of dizziness. Pt has not been feeling well lately, noting staying in bed for 12 hours for the past few months. Pt got [facility] of a car today at [facility] [place] and felt imbalanced and dizzy. She said her head felt "big" and heavy. She says her imbalance has subsided but her head still feels heavy. Pt says sitting down improves her symptoms. Changing position worsens her symptoms  She , tachycarida ,skipping beats, falling, vision changes and difficulty with speech. Pt is worried about a stroke.  Pt states she has sinus pressure last few days. She has weakness, rhinorrhea and drainage and problems with BM as associated symptoms. She does not take over the medication for her sinus pressure but notes taking vitamins for her eyes. She denies fever, ear pain, hearing loss, loss of appetite, and chills.  Pt has urinary frequency primarily at night. Well see Dr. Idamae Schuller for a CTI scan in 4 days ago. [facility] [place].  Past Medical History  Diagnosis Date  . Arthritis   . Diabetes mellitus   . Hypertension   . Hyperlipidemia   . Allergy   . Osteopenia, senile 09/26/2012    T score - 1.6 Left femur   Current Outpatient Prescriptions on File Prior to Visit  Medication Sig Dispense Refill  . benzonatate (TESSALON) 100 MG capsule Take 1-2 capsules (100-200 mg total) by mouth 3 (three) times daily as needed for cough. 40 capsule 1  . fish oil-omega-3 fatty acids 1000 MG capsule Take 2 g by mouth daily.     Marland Kitchen lisinopril (PRINIVIL,ZESTRIL) 10 MG tablet TAKE 1 TABLET (10 MG TOTAL) BY MOUTH DAILY. 90 tablet 2  . metFORMIN (GLUCOPHAGE) 500 MG tablet Take 1 tablet (500 mg total) by mouth 2 (two) times daily with a meal. 180 tablet 2  . OVER THE COUNTER MEDICATION Vit D 3 5000 iu taking daily    . OVER THE COUNTER MEDICATION OTC Aquaph Healing Ointment for itching    . oxybutynin (DITROPAN) 5 MG tablet Take 0.5 tablets (2.5 mg total) by mouth every 8 (eight) hours as needed for bladder spasms. 90 tablet 0   No current facility-administered medications on file prior to visit.  No Known Allergies  Review of Systems  Constitutional: Negative for fever, chills and appetite change.  HENT: Positive for rhinorrhea and sinus pressure.   Eyes: Negative for visual disturbance.  Genitourinary: Positive for frequency.  Neurological: Positive for dizziness and weakness. Negative for speech difficulty.      Objective:  BP 142/72 mmHg  Pulse 74  Temp(Src) 98.4 F (36.9 C) (Oral)  Resp 16  SpO2 98%  Physical Exam  Constitutional: She is oriented to person, place, and time. She appears well-developed and well-nourished. No distress.  HENT:  Head: Normocephalic and atraumatic.  Eyes: EOM are normal.  Neck: Normal range of motion.  Cardiovascular: Normal rate.   Pulmonary/Chest: Effort normal.  Neurological: She is alert and oriented to person, place, and time. No cranial nerve deficit. She displays a negative Romberg sign.  Reflex Scores:      Tricep reflexes are 2+ on the right side and 2+ on the left side.      Bicep reflexes are 2+ on the right side and 2+ on the left side.      Brachioradialis reflexes are 2+ on the right side and 2+ on the left side. Cranial nerves 2-12 intact. Negative Romberg test Negative pronator drift. Normal heel to shin. Normal rapid alternative movement. Normal finger to nose test.  Skin: Skin is warm and dry.  Psychiatric: She has a normal mood and affect. Her  behavior is normal.  Nursing note and vitals reviewed.    [facility] reading (PRIMARY) by  Dr. Brigitte Pulse. EKG: NSR, no acute ischemic changes. Assessment & Plan:   Dizziness - Plan: EKG 12-Lead, POCT CBC, POCT glycosylated hemoglobin (Hb A1C), POCT SEDIMENTATION RATE, Comprehensive metabolic panel, TSH - suspect her imbalance and dizzyness is due to chronic sinus infection in left maxillary. If sxs recur or worsen, to ER for head CT.    Essential hypertension - BP labile and likely up due to dizzy sxs. Cont to monitor BP outside the office and lisinopril 10  Ataxia - Plan: TSH  Lightheadedness - Plan: TSH  Nocturnal polyuria - Plan: TSH  Polydipsia - Plan: TSH  Type 2 diabetes mellitus with complication - Plan: POCT glycosylated hemoglobin (Hb A1C), TSH - very well controlled at 6.1 - consider stopping metformin at next OV. At risk for polypharmacy  Chronic fatigue - Plan: TSH  Hyponatremia  Memory loss - Plan: TSH  Chronic maxillary sinusitis  Meds ordered this encounter  Medications  . fluticasone (FLONASE) 50 MCG/ACT nasal spray    Sig: Place 2 sprays into both nostrils at bedtime.    Dispense:  16 g    Refill:  2  . guaiFENesin (MUCINEX) 600 MG 12 hr tablet    Sig: Take 1 tablet (600 mg total) by mouth 2 (two) times daily.    Dispense:  14 tablet    Refill:  1    I personally performed the services described in this documentation, which was scribed in my esence. The recorded information has been reviewed and considered, and addended by me as needed.  Delman Cheadle, MD MPH  40 min of face to face evaluation and coordination of care with pt.  Results for orders placed or performed in visit on 06/06/14  Comprehensive metabolic panel  Result Value Ref Range   Sodium 137 135 - 145 mEq/L   Potassium 4.5 3.5 - 5.3 mEq/L   Chloride 103 96 - 112 mEq/L   CO2 26 19 - 32 mEq/L   Glucose, Bld 108 (H) 70 - 99 mg/dL   BUN 13 6 - 23 mg/dL   Creat 0.83 0.50 - 1.10 mg/dL   Total Bilirubin  0.4 0.2 - 1.2 mg/dL   Alkaline Phosphatase 50 39 - 117 U/L   AST 15 0 - 37 U/L   ALT 12 0 - 35 U/L   Total Protein 7.2 6.0 - 8.3 g/dL   Albumin 4.1 3.5 - 5.2 g/dL   Calcium 9.8 8.4 - 10.5 mg/dL  TSH  Result Value Ref Range   TSH 1.465 0.350 - 4.500 uIU/mL  POCT CBC  Result Value Ref Range   WBC 11.0 (A) 4.6 - 10.2 K/uL   Lymph, poc 3.2 0.6 - 3.4  POC LYMPH PERCENT 29.5 10 - 50 %L   MID (cbc) 0.2 0 - 0.9   POC MID % 2.1 0 - 12 %M   POC Granulocyte 7.5 (A) 2 - 6.9   Granulocyte percent 68.4 37 - 80 %G   RBC 4.56 4.04 - 5.48 M/uL   Hemoglobin 14.0 12.2 - 16.2 g/dL   HCT, POC 42.5 37.7 - 47.9 %   MCV 93.0 80 - 97 fL   MCH, POC 30.6 27 - 31.2 pg   MCHC 32.9 31.8 - 35.4 g/dL   RDW, POC 13.9 %   Platelet Count, POC 293 142 - 424 K/uL   MPV 8.4 0 - 99.8 fL  POCT glycosylated hemoglobin (Hb A1C)  Result Value Ref Range   Hemoglobin A1C 6.1   POCT SEDIMENTATION RATE  Result Value Ref Range   POCT SED RATE 26 (A) 0 - 22 mm/hr

## 2014-06-08 ENCOUNTER — Encounter: Payer: Self-pay | Admitting: Family Medicine

## 2014-06-10 DIAGNOSIS — R110 Nausea: Secondary | ICD-10-CM | POA: Diagnosis not present

## 2014-06-10 DIAGNOSIS — N281 Cyst of kidney, acquired: Secondary | ICD-10-CM | POA: Diagnosis not present

## 2014-06-10 DIAGNOSIS — R312 Other microscopic hematuria: Secondary | ICD-10-CM | POA: Diagnosis not present

## 2014-06-10 DIAGNOSIS — R1030 Lower abdominal pain, unspecified: Secondary | ICD-10-CM | POA: Diagnosis not present

## 2014-06-13 ENCOUNTER — Other Ambulatory Visit: Payer: Self-pay

## 2014-06-13 MED ORDER — METFORMIN HCL 500 MG PO TABS: 500.0000 mg | ORAL_TABLET | Freq: Two times a day (BID) | ORAL | Status: DC

## 2014-06-16 ENCOUNTER — Encounter: Payer: Self-pay | Admitting: Family Medicine

## 2014-06-16 DIAGNOSIS — N952 Postmenopausal atrophic vaginitis: Secondary | ICD-10-CM | POA: Insufficient documentation

## 2014-06-19 ENCOUNTER — Other Ambulatory Visit: Payer: Self-pay

## 2014-06-19 MED ORDER — GLUCOSE BLOOD VI STRP: ORAL_STRIP | Status: DC

## 2014-07-03 ENCOUNTER — Other Ambulatory Visit: Payer: Self-pay | Admitting: Family Medicine

## 2014-07-14 ENCOUNTER — Encounter: Payer: Self-pay | Admitting: Family Medicine

## 2014-07-14 ENCOUNTER — Ambulatory Visit (INDEPENDENT_AMBULATORY_CARE_PROVIDER_SITE_OTHER): Payer: Medicare Other | Admitting: Family Medicine

## 2014-07-14 VITALS — BP 150/75 | HR 85 | Temp 98.1°F | Resp 16 | Ht 63.0 in | Wt 169.0 lb

## 2014-07-14 DIAGNOSIS — E119 Type 2 diabetes mellitus without complications: Secondary | ICD-10-CM | POA: Diagnosis not present

## 2014-07-14 DIAGNOSIS — Z23 Encounter for immunization: Secondary | ICD-10-CM

## 2014-07-14 DIAGNOSIS — I10 Essential (primary) hypertension: Secondary | ICD-10-CM

## 2014-07-14 LAB — LDL CHOLESTEROL, DIRECT: Direct LDL: 133 mg/dL — ABNORMAL HIGH

## 2014-07-14 LAB — MICROALBUMIN, URINE: Microalb, Ur: 3.5 mg/dL — ABNORMAL HIGH (ref <2.0)

## 2014-07-14 MED ORDER — LISINOPRIL 10 MG PO TABS: 10.0000 mg | ORAL_TABLET | Freq: Every day | ORAL | Status: DC

## 2014-07-14 NOTE — Patient Instructions (Signed)
Good to see you today as always!  I will be in touch with your labs Your recent A1c test shows that you average blood sugars are very good- you do not need to continue to check your sugar daily if you do not want to Check your sugar once a week or so, and check it if you are not feeling well. Let's plan to recheck in a 3-4 months You got your pneumonia shot today.

## 2014-07-14 NOTE — Progress Notes (Signed)
[facility] [address], [place] [address] [phone]  Date:  07/14/2014   Name:  Catherine Ellis   DOB:  Oct 27, 1921   MRN:  [id number]  PCP:  Delman Cheadle, MD    Chief Complaint: Follow-up; Diabetes; Cough; and Nasal Congestion   History of Present Illness:  Catherine Ellis is a 81 y.o. very pleasant female patient who presents with the following:  Here today for a recheck.  She is doing ok overall.  Recent A1c looked ok.  She brings in a copy of her glucose readings which look good.  The holidays are a rough time for her due to some bad memories in the past.   She did catch a cold about 4 days ago with sinus drainage, cough, no fever.    She is a regular blood donor.   She is not fasting today.   She checks her glucose daily - numbers running 90s- 130s BP Readings from Last 3 Encounters:  07/14/14 152/75  06/06/14 142/72  04/14/14 120/70     Lab Results  Component Value Date   HGBA1C 6.1 06/06/2014     Patient Active Problem List   Diagnosis Date Noted  . Atrophic vaginitis 06/16/2014  . Chronic maxillary sinusitis 06/06/2014  . Microhematuria 06/05/2014  . Unspecified vitamin D deficiency 08/10/2013  . Vitamin B12 deficiency 06/14/2013  . Seasonal affective disorder 06/14/2013  . Fatigue 06/14/2013  . Hypertension 02/22/2013  . Other and unspecified hyperlipidemia 02/22/2013  . Hyponatremia 02/22/2013  . Memory loss 02/22/2013  . Diabetes mellitus 04/13/2012  . At risk for polypharmacy 04/13/2012  . Incontinence of urine 04/13/2012  . Anal irritation 04/13/2012    Past Medical History  Diagnosis Date  . Arthritis   . Diabetes mellitus   . Hypertension   . Hyperlipidemia   . Allergy   . Osteopenia, senile 09/26/2012    T score - 1.6 Left femur    Past Surgical History  Procedure Laterality Date  . Back surgery      1965  . Knee surgery    . Appendectomy    . Eye surgery      History  Substance Use Topics  . Smoking  status: Never Smoker   . Smokeless tobacco: Not on file  . Alcohol Use: No    Family History  Problem Relation Age of Onset  . Heart disease Father     No Known Allergies  Medication list has been reviewed and updated.  Current Outpatient Prescriptions on File Prior to Visit  Medication Sig Dispense Refill  . fish oil-omega-3 fatty acids 1000 MG capsule Take 2 g by mouth daily.    . fluticasone (FLONASE) 50 MCG/ACT nasal spray Place 2 sprays into both nostrils at bedtime. 16 g 2  . glucose blood test strip Test blood sugar daily. Dx code: E11.8 100 each 3  . lisinopril (PRINIVIL,ZESTRIL) 10 MG tablet TAKE 1 TABLET DAILY. 30 tablet 0  . metFORMIN (GLUCOPHAGE) 500 MG tablet Take 1 tablet (500 mg total) by mouth 2 (two) times daily with a meal. 180 tablet 0  . OVER THE COUNTER MEDICATION Vit D 3 5000 iu taking daily    . OVER THE COUNTER MEDICATION OTC Aquaph Healing Ointment for itching    . benzonatate (TESSALON) 100 MG capsule Take 1-2 capsules (100-200 mg total) by mouth 3 (three) times daily as needed for cough. (Patient not taking: Reported on 07/14/2014) 40 capsule 1  . guaiFENesin ([place])  600 MG 12 hr tablet Take 1 tablet (600 mg total) by mouth 2 (two) times daily. (Patient not taking: Reported on 07/14/2014) 14 tablet 1   No current facility-administered medications on file prior to visit.    Review of Systems:  As per HPI- otherwise negative.   Physical Examination: Filed Vitals:   07/14/14 0851  BP: 152/75  Pulse: 85  Temp: 98.1 F (36.7 C)  Resp: 16   Filed Vitals:   07/14/14 0851  Height: 5\' 3"  (1.6 m)  Weight: 169 lb (76.658 kg)   Body mass index is 29.94 kg/(m^2). Ideal Body Weight: Weight in (lb) to have BMI = 25: 140.8  GEN: WDWN, NAD, Non-toxic, A & O x 3, elderly lady who looks well HEENT: Atraumatic, Normocephalic. Neck supple. No masses, No LAD. PEERL, oropharynx wnl Ears and Nose: No external deformity. CV: RRR, No M/G/R. No JVD. No thrill. No  extra heart sounds. PULM: CTA B, no wheezes, crackles, rhonchi. No retractions. No resp. distress. No accessory muscle use. EXTR: No c/c/e NEURO Normal gait.  PSYCH: Normally interactive. Conversant. Not depressed or anxious appearing.  Calm demeanor.    Assessment and Plan: Diabetes mellitus type 2 in nonobese - Plan: LDL cholesterol, direct, Microalbumin, urine  Immunization due - Plan: Pneumococcal conjugate vaccine 13-valent IM  Essential hypertension - Plan: lisinopril (PRINIVIL,ZESTRIL) 10 MG tablet  prevnar today, refilled her lisinopril  Recent A1c looked very good Follow-up Her GFR is approx 45, creat has been normal.  Ok to continue using metfomrin 500 BID Signed Lamar Blinks, MD

## 2014-09-19 ENCOUNTER — Other Ambulatory Visit: Payer: Self-pay | Admitting: Family Medicine

## 2014-10-13 ENCOUNTER — Encounter: Payer: Self-pay | Admitting: Family Medicine

## 2014-10-13 ENCOUNTER — Ambulatory Visit (INDEPENDENT_AMBULATORY_CARE_PROVIDER_SITE_OTHER): Payer: Medicare Other | Admitting: Family Medicine

## 2014-10-13 VITALS — BP 130/68 | HR 72 | Temp 97.7°F | Resp 16 | Ht 63.0 in | Wt 164.0 lb

## 2014-10-13 DIAGNOSIS — E119 Type 2 diabetes mellitus without complications: Secondary | ICD-10-CM

## 2014-10-13 DIAGNOSIS — Z13220 Encounter for screening for lipoid disorders: Secondary | ICD-10-CM

## 2014-10-13 DIAGNOSIS — Z130 Encounter for screening for diseases of the blood and blood-forming organs and certain disorders involving the immune mechanism: Secondary | ICD-10-CM

## 2014-10-13 DIAGNOSIS — R42 Dizziness and giddiness: Secondary | ICD-10-CM

## 2014-10-13 LAB — HEMOGLOBIN A1C
Hgb A1c MFr Bld: 6.3 % — ABNORMAL HIGH (ref <5.7)
Mean Plasma Glucose: 134 mg/dL — ABNORMAL HIGH (ref <117)

## 2014-10-13 LAB — COMPREHENSIVE METABOLIC PANEL
ALT: 13 U/L (ref 0–35)
AST: 15 U/L (ref 0–37)
Albumin: 4 g/dL (ref 3.5–5.2)
Alkaline Phosphatase: 50 U/L (ref 39–117)
BUN: 18 mg/dL (ref 6–23)
CO2: 27 mEq/L (ref 19–32)
Calcium: 9.2 mg/dL (ref 8.4–10.5)
Chloride: 102 mEq/L (ref 96–112)
Creat: 0.78 mg/dL (ref 0.50–1.10)
Glucose, Bld: 110 mg/dL — ABNORMAL HIGH (ref 70–99)
Potassium: 4.5 mEq/L (ref 3.5–5.3)
Sodium: 137 mEq/L (ref 135–145)
Total Bilirubin: 0.5 mg/dL (ref 0.2–1.2)
Total Protein: 7.1 g/dL (ref 6.0–8.3)

## 2014-10-13 LAB — CBC
HCT: 39.6 % (ref 36.0–46.0)
Hemoglobin: 13.2 g/dL (ref 12.0–15.0)
MCH: 30.1 pg (ref 26.0–34.0)
MCHC: 33.3 g/dL (ref 30.0–36.0)
MCV: 90.4 fL (ref 78.0–100.0)
MPV: 10.1 fL (ref 8.6–12.4)
Platelets: 347 10*3/uL (ref 150–400)
RBC: 4.38 MIL/uL (ref 3.87–5.11)
RDW: 13.4 % (ref 11.5–15.5)
WBC: 7.7 10*3/uL (ref 4.0–10.5)

## 2014-10-13 LAB — LIPID PANEL
Cholesterol: 213 mg/dL — ABNORMAL HIGH (ref 0–200)
HDL: 45 mg/dL — ABNORMAL LOW (ref >=46)
LDL Cholesterol: 135 mg/dL — ABNORMAL HIGH (ref 0–99)
Total CHOL/HDL Ratio: 4.7 Ratio
Triglycerides: 167 mg/dL — ABNORMAL HIGH (ref <150)
VLDL: 33 mg/dL (ref 0–40)

## 2014-10-13 MED ORDER — BLOOD GLUCOSE MONITOR KIT: PACK | Status: DC

## 2014-10-13 MED ORDER — METFORMIN HCL 500 MG PO TABS: ORAL_TABLET | ORAL | Status: DC

## 2014-10-13 NOTE — Progress Notes (Addendum)
[facility] [address], [place] [address] [phone]  Date:  10/13/2014   Name:  Catherine Ellis   DOB:  [birth year]   MRN:  [id number]  PCP:  Delman Cheadle, MD    Chief Complaint: Follow-up and Diabetes   History of Present Illness:  Catherine Ellis is a 81 y.o. very pleasant female patient who presents with the following:  Here today for a periodic DM check-up.  Well controlled DM at last A1c.  Most recent creat clearance: 44, creat 0.83.   She is doing well overall but she is having some dental work comoing up.   She also notes that "my balance is not as good as I wish it would be."  She is a resident of [facility]- she tried PT there but "they were so careful with me that we didn't do anything.'  She does exercise several times a week. It is not practical for her to leave her residence for PT Her glucose meter broke a couple of weeks ago; prior to this it seemed to be ok but she cannot really remember what her numbers looked like.   She needs a new meter and plans to get one soon.   She is fasting this am for labs  Lab Results  Component Value Date   HGBA1C 6.1 06/06/2014   She no longer drives  Wt Readings from Last 3 Encounters:  10/13/14 164 lb (74.39 kg)  07/14/14 169 lb (76.658 kg)  04/14/14 169 lb 9.6 oz (76.93 kg)     Patient Active Problem List   Diagnosis Date Noted  . Atrophic vaginitis 06/16/2014  . Chronic maxillary sinusitis 06/06/2014  . Microhematuria 06/05/2014  . Unspecified vitamin D deficiency 08/10/2013  . Vitamin B12 deficiency 06/14/2013  . Seasonal affective disorder 06/14/2013  . Fatigue 06/14/2013  . Hypertension 02/22/2013  . Other and unspecified hyperlipidemia 02/22/2013  . Hyponatremia 02/22/2013  . Memory loss 02/22/2013  . Diabetes mellitus 04/13/2012  . At risk for polypharmacy 04/13/2012  . Incontinence of urine 04/13/2012  . Anal irritation 04/13/2012    Past Medical History  Diagnosis Date  .  Arthritis   . Diabetes mellitus   . Hypertension   . Hyperlipidemia   . Allergy   . Osteopenia, senile 09/26/2012    T score - 1.6 Left femur    Past Surgical History  Procedure Laterality Date  . Back surgery      1965  . Knee surgery    . Appendectomy    . Eye surgery      History  Substance Use Topics  . Smoking status: Never Smoker   . Smokeless tobacco: Not on file  . Alcohol Use: No    Family History  Problem Relation Age of Onset  . Heart disease Father     No Known Allergies  Medication list has been reviewed and updated.  Current Outpatient Prescriptions on File Prior to Visit  Medication Sig Dispense Refill  . fish oil-omega-3 fatty acids 1000 MG capsule Take 2 g by mouth daily.    . fluticasone (FLONASE) 50 MCG/ACT nasal spray Place 2 sprays into both nostrils at bedtime. 16 g 2  . glucose blood test strip Test blood sugar daily. Dx code: E11.8 100 each 3  . lisinopril (PRINIVIL,ZESTRIL) 10 MG tablet Take 1 tablet (10 mg total) by mouth daily. 30 tablet 11  . metFORMIN (GLUCOPHAGE) 500 MG tablet TAKE 1 TABLET TWICE DAILY WITH FOOD. [place]  tablet 1  . OVER THE COUNTER MEDICATION Vit D 3 5000 iu taking daily    . OVER THE COUNTER MEDICATION OTC Aquaph Healing Ointment for itching     No current facility-administered medications on file prior to visit.    Review of Systems:  As per HPI- otherwise negative.   Physical Examination: Filed Vitals:   10/13/14 0810  BP: 130/68  Pulse: 72  Temp: 97.7 F (36.5 C)  Resp: 16   Filed Vitals:   10/13/14 0810  Height: 5\' 3"  (1.6 m)  Weight: 164 lb (74.39 kg)   Body mass index is 29.06 kg/(m^2). Ideal Body Weight: Weight in (lb) to have BMI = 25: 140.8  GEN: WDWN, NAD, Non-toxic, A & O x 3, looks well and younger than age 82: Atraumatic, Normocephalic. Neck supple. No masses, No LAD. Ears and Nose: No external deformity. CV: RRR, No M/G/R. No JVD. No thrill. No extra heart sounds. PULM: CTA B, no  wheezes, crackles, rhonchi. No retractions. No resp. distress. No accessory muscle use. ABD: S, NT, ND, +BS. No rebound. No HSM. EXTR: No c/c/e NEURO Normal gait.  PSYCH: Normally interactive. Conversant. Not depressed or anxious appearing.  Calm demeanor.  Normal foot exam  Assessment and Plan: Diabetes mellitus type 2, controlled - Plan: Comprehensive metabolic panel, Lipid panel, Hemoglobin A1c, metFORMIN (GLUCOPHAGE) 500 MG tablet  Screening for hyperlipidemia - Plan: Lipid panel  Screening for deficiency anemia - Plan: CBC  Lightheadedness - Plan: CBC  Will call [facility] and order her a new glucometer to be delivered  Here today for a periodic check up.   She notes some difficulty with her balance but has not had any falls. She does not drive so going to see PT externally is not practical.  They do have PT at [facility] where she resides- encouraged her to give this another try and to keep exercising BP looks fine.  She is older but her renal function has always been ok to continue metformin at 1,000 mg total a day  Signed Lamar Blinks, MD  4/5: despite her age her creat is 0.78 and clearance is estimated to be 76ml/min.  Will continue metformin but not go above 1,000 mg a day  Results for orders placed or performed in visit on 10/13/14  CBC  Result Value Ref Range   WBC 7.7 4.0 - 10.5 K/uL   RBC 4.38 3.87 - 5.11 MIL/uL   Hemoglobin 13.2 12.0 - 15.0 g/dL   HCT 39.6 36.0 - 46.0 %   MCV 90.4 78.0 - 100.0 fL   MCH 30.1 26.0 - 34.0 pg   MCHC 33.3 30.0 - 36.0 g/dL   RDW 13.4 11.5 - 15.5 %   Platelets 347 150 - 400 K/uL   MPV 10.1 8.6 - 12.4 fL  Comprehensive metabolic panel  Result Value Ref Range   Sodium 137 135 - 145 mEq/L   Potassium 4.5 3.5 - 5.3 mEq/L   Chloride 102 96 - 112 mEq/L   CO2 27 19 - 32 mEq/L   Glucose, Bld 110 (H) 70 - 99 mg/dL   BUN 18 6 - 23 mg/dL   Creat 0.78 0.50 - 1.10 mg/dL   Total Bilirubin 0.5 0.2 - 1.2 mg/dL   Alkaline Phosphatase 50  39 - 117 U/L   AST 15 0 - 37 U/L   ALT 13 0 - 35 U/L   Total Protein 7.1 6.0 - 8.3 g/dL   Albumin 4.0 3.5 - 5.2  g/dL   Calcium 9.2 8.4 - 10.5 mg/dL  Lipid panel  Result Value Ref Range   Cholesterol 213 (H) 0 - 200 mg/dL   Triglycerides 167 (H) <150 mg/dL   HDL 45 (L) >=46 mg/dL   Total CHOL/HDL Ratio 4.7 Ratio   VLDL 33 0 - 40 mg/dL   LDL Cholesterol 135 (H) 0 - 99 mg/dL  Hemoglobin A1c  Result Value Ref Range   Hgb A1c MFr Bld 6.3 (H) <5.7 %   Mean Plasma Glucose 134 (H) <117 mg/dL

## 2014-10-13 NOTE — Patient Instructions (Signed)
I will be in touch with you regarding your labs asap Continue to stay active!  This is the best way to prevent falls.   Take care and please see me in about 4 months.

## 2014-10-14 ENCOUNTER — Encounter: Payer: Self-pay | Admitting: Family Medicine

## 2015-02-16 ENCOUNTER — Encounter: Payer: Self-pay | Admitting: Family Medicine

## 2015-02-16 ENCOUNTER — Ambulatory Visit (INDEPENDENT_AMBULATORY_CARE_PROVIDER_SITE_OTHER): Payer: Medicare Other

## 2015-02-16 ENCOUNTER — Ambulatory Visit (INDEPENDENT_AMBULATORY_CARE_PROVIDER_SITE_OTHER): Payer: Medicare Other | Admitting: Family Medicine

## 2015-02-16 VITALS — BP 130/74 | HR 74 | Temp 97.7°F | Resp 14 | Ht 62.0 in | Wt 166.4 lb

## 2015-02-16 DIAGNOSIS — M25562 Pain in left knee: Secondary | ICD-10-CM | POA: Diagnosis not present

## 2015-02-16 DIAGNOSIS — E119 Type 2 diabetes mellitus without complications: Secondary | ICD-10-CM

## 2015-02-16 DIAGNOSIS — M1712 Unilateral primary osteoarthritis, left knee: Secondary | ICD-10-CM

## 2015-02-16 DIAGNOSIS — M179 Osteoarthritis of knee, unspecified: Secondary | ICD-10-CM

## 2015-02-16 LAB — BASIC METABOLIC PANEL
BUN: 21 mg/dL (ref 7–25)
CO2: 24 mmol/L (ref 20–31)
Calcium: 9.8 mg/dL (ref 8.6–10.4)
Chloride: 101 mmol/L (ref 98–110)
Creat: 0.85 mg/dL (ref 0.60–0.88)
Glucose, Bld: 120 mg/dL — ABNORMAL HIGH (ref 65–99)
Potassium: 5.1 mmol/L (ref 3.5–5.3)
Sodium: 139 mmol/L (ref 135–146)

## 2015-02-16 LAB — HEMOGLOBIN A1C
Hgb A1c MFr Bld: 6.4 % — ABNORMAL HIGH (ref <5.7)
Mean Plasma Glucose: 137 mg/dL — ABNORMAL HIGH (ref <117)

## 2015-02-16 NOTE — Patient Instructions (Signed)
Wear brace as tolerated for knee support, do not wear at night Can put ice or heat on the affected area Instructed on the use of tylenol as needed for pain control, can take up to 1000 mg three times daily Try to avoid aspirin and ibuprofen as they can have adverse affects in a pt her age If worsening knee pain or more constant giving out can consider return to clinic for reevaluation

## 2015-02-16 NOTE — Progress Notes (Signed)
[facility] [address], [place]  [id number] [phone]  Date:  02/16/2015   Name:  Catherine Ellis   DOB:  February 05, 1922   MRN:  [id number]  PCP:  Delman Cheadle, MD    Chief Complaint: Follow-up; Diabetes; Hypertension; and Medication Refill   History of Present Illness:  Catherine Ellis is a 81 y.o. very pleasant female patient who presents with the following:  Trouble with left knee feeling like it will give out. Feels like left foot is dragging.  Started a long time ago, worse over the last 2 weeks or so No falls on left knee No weakness in foot or ankle No significant pain left knee continually, just short sharp pain associated with weakness No associated hip pain Denies numbness or tingling, previously had left meniscal repair Worse with walking  Nothing specifically makes it better No trouble with RLE/LUE/RUE Not taking medication for pain No popping or locking of knee Patient Active Problem List   Diagnosis Date Noted  . Atrophic vaginitis 06/16/2014  . Chronic maxillary sinusitis 06/06/2014  . Microhematuria 06/05/2014  . Unspecified vitamin D deficiency 08/10/2013  . Vitamin B12 deficiency 06/14/2013  . Seasonal affective disorder 06/14/2013  . Fatigue 06/14/2013  . Hypertension 02/22/2013  . Other and unspecified hyperlipidemia 02/22/2013  . Hyponatremia 02/22/2013  . Memory loss 02/22/2013  . Diabetes mellitus 04/13/2012  . At risk for polypharmacy 04/13/2012  . Incontinence of urine 04/13/2012  . Anal irritation 04/13/2012    Past Medical History  Diagnosis Date  . Arthritis   . Diabetes mellitus   . Hypertension   . Hyperlipidemia   . Allergy   . Osteopenia, senile 09/26/2012    T score - 1.6 Left femur    Past Surgical History  Procedure Laterality Date  . Back surgery      1965  . Knee surgery    . Appendectomy    . Eye surgery      History  Substance Use Topics  . Smoking status: Never Smoker   . Smokeless  tobacco: Not on file  . Alcohol Use: No    Family History  Problem Relation Age of Onset  . Heart disease Father     No Known Allergies  Medication list has been reviewed and updated.  Current Outpatient Prescriptions on File Prior to Visit  Medication Sig Dispense Refill  . fish oil-omega-3 fatty acids 1000 MG capsule Take 2 g by mouth daily.    Marland Kitchen lisinopril (PRINIVIL,ZESTRIL) 10 MG tablet Take 1 tablet (10 mg total) by mouth daily. 30 tablet 11  . metFORMIN (GLUCOPHAGE) 500 MG tablet TAKE 1 TABLET TWICE DAILY WITH FOOD. 60 tablet 11  . OVER THE COUNTER MEDICATION Vit D 3 5000 iu taking daily    . OVER THE COUNTER MEDICATION OTC Aquaph Healing Ointment for itching    . blood glucose meter kit and supplies KIT Pt would prefer one touch ultra.  History of controlled DM, checks glucose once daily. Deliver please (Patient not taking: Reported on 02/16/2015) 1 each 0  . fluticasone (FLONASE) 50 MCG/ACT nasal spray Place 2 sprays into both nostrils at bedtime. (Patient not taking: Reported on 02/16/2015) 16 g 2  . glucose blood test strip Test blood sugar daily. Dx code: E11.8 (Patient not taking: Reported on 02/16/2015) 100 each 3   No current facility-administered medications on file prior to visit.    Review of Systems:  ROS  Gen: no fever, chills night sweats  Cardio: no chest pain, palpitations, bradycardia, tachycardia Pulm: no SOB, no accessory muscle use GI: no nausea, vomiting, diarrhea Musculo: left sharp lateral knee pain radiating down to mid shin, no numbness or tingling, feels like it is going to give out on her, no popping or locking, denies trauma or falls, no ankle pain, no weakness in that lower extremity    Physical Examination: Filed Vitals:   02/16/15 0930  BP: 130/74  Pulse: 74  Temp: 97.7 F (36.5 C)  Resp: 14   Filed Vitals:   02/16/15 0930  Height: '5\' 2"'  (1.575 m)  Weight: 166 lb 6.4 oz (75.479 kg)   Body mass index is 30.43 kg/(m^2). Ideal Body  Weight: Weight in (lb) to have BMI = 25: 136.4  GEN: WDWN, NAD, Non-toxic, A & O x 3, well appearing elderly lady CV: RRR, No M/G/R. No JVD. No thrill. No extra heart sounds. PULM: CTA B, no wheezes, crackles, rhonchi. No retractions. No resp. distress. No accessory muscle use. EXTR: TTP of lateral joint line, -mcmurrys, -lachmans/anterior drawer, - posterior drawer, - apleys grind, no medial joint line tenderness, no patellar or quad tenderness at insertion, no significant swelling or redness of the knee joint NEURO Normal gait. No focal deficits.  No actual foot drop on exam PSYCH: Normally interactive. Conversant. Not depressed or anxious appearing.  Calm demeanor.   [facility] reading (PRIMARY) by  Dr. Lorelei Pont. Left knee: mild to moderate OA LEFT KNEE - COMPLETE 4+ VIEW  COMPARISON: None.  FINDINGS: There is moderate arthritis of the patellofemoral compartment with marginal osteophytes in the lateral compartment. Small joint effusion. No fracture or bone destruction. Degenerative calcifications in the proximal patellar tendon.  IMPRESSION: Mild-to-moderate osteoarthritis with a small joint effusion.   Assessment and Plan: Left knee pain - Plan: DG Knee Complete 4 Views Left  Diabetes mellitus type 2, controlled  Osteoarthritis of left knee, unspecified osteoarthritis type   Flare of osteoarthritis of left knee Xray consistent with mild arthritis of the left lateral knee joint  No neurologic concerns on exam or concerning exam signs consistsent with ligamentous or meniscal pathology Recommended tylenol as needed for pain, up to 1000 mg TID Placed in hinge knee brace for support Counseled on risks of using ASA and other NSAIDs at her age  Will recheck A1C today- Will plan further follow- up pending labs.  Signed Ben Adams-Doolittle, DO/ Lamar Blinks, MD. I have also examined this pt and agree with Dr. Ninfa Meeker assessment and plan  Results for orders placed or performed  in visit on 65/99/35  Basic metabolic panel  Result Value Ref Range   Sodium 139 135 - 146 mmol/L   Potassium 5.1 3.5 - 5.3 mmol/L   Chloride 101 98 - 110 mmol/L   CO2 24 20 - 31 mmol/L   Glucose, Bld 120 (H) 65 - 99 mg/dL   BUN 21 7 - 25 mg/dL   Creat 0.85 0.60 - 0.88 mg/dL   Calcium 9.8 8.6 - 10.4 mg/dL  Hemoglobin A1c  Result Value Ref Range   Hgb A1c MFr Bld 6.4 (H) <5.7 %   Mean Plasma Glucose 137 (H) <117 mg/dL   GFR is over 60, ok to continue metformin

## 2015-02-17 ENCOUNTER — Encounter: Payer: Self-pay | Admitting: Family Medicine

## 2015-03-19 ENCOUNTER — Ambulatory Visit (INDEPENDENT_AMBULATORY_CARE_PROVIDER_SITE_OTHER): Payer: Medicare Other | Admitting: Emergency Medicine

## 2015-03-19 VITALS — BP 128/68 | HR 94 | Temp 98.0°F | Resp 16 | Ht 62.0 in | Wt 167.4 lb

## 2015-03-19 DIAGNOSIS — K429 Umbilical hernia without obstruction or gangrene: Secondary | ICD-10-CM

## 2015-03-19 DIAGNOSIS — M1712 Unilateral primary osteoarthritis, left knee: Secondary | ICD-10-CM

## 2015-03-19 DIAGNOSIS — M179 Osteoarthritis of knee, unspecified: Secondary | ICD-10-CM | POA: Diagnosis not present

## 2015-03-19 MED ORDER — MELOXICAM 7.5 MG PO TABS: 7.5000 mg | ORAL_TABLET | Freq: Every day | ORAL | Status: DC

## 2015-03-19 NOTE — Patient Instructions (Signed)

## 2015-03-19 NOTE — Progress Notes (Signed)
Subjective:  Patient ID: Catherine Ellis, female    DOB: 1922-01-22  Age: 81 y.o. MRN: [id number]  CC: was bleeding from navel   HPI Catherine Ellis presents  for a concern about "bleeding" from her umbilicus. She has a small reducible umbilical hernia that is not painful. She noticed some blood oozing from the umbilicus. She uses Vaseline applied to her umbilicus daily. She denies any fever chills nausea vomiting or abdominal pain  History Aggie has a past medical history of Arthritis; Diabetes mellitus; Hypertension; Hyperlipidemia; Allergy; and Osteopenia, senile (09/26/2012).   She has past surgical history that includes Back surgery; Knee surgery; Appendectomy; Eye surgery; and Gum surgery.   Her  family history includes Heart disease in her father.  She   reports that she has never smoked. She has never used smokeless tobacco. She reports that she does not drink alcohol or use illicit drugs.  Outpatient Prescriptions Prior to Visit  Medication Sig Dispense Refill  . blood glucose meter kit and supplies KIT Pt would prefer one touch ultra.  History of controlled DM, checks glucose once daily. Deliver please 1 each 0  . fish oil-omega-3 fatty acids 1000 MG capsule Take 2 g by mouth daily.    . fluticasone (FLONASE) 50 MCG/ACT nasal spray Place 2 sprays into both nostrils at bedtime. 16 g 2  . glucose blood test strip Test blood sugar daily. Dx code: E11.8 100 each 3  . lisinopril (PRINIVIL,ZESTRIL) 10 MG tablet Take 1 tablet (10 mg total) by mouth daily. 30 tablet 11  . metFORMIN (GLUCOPHAGE) 500 MG tablet TAKE 1 TABLET TWICE DAILY WITH FOOD. 60 tablet 11  . OVER THE COUNTER MEDICATION Vit D 3 5000 iu taking daily    . OVER THE COUNTER MEDICATION OTC Aquaph Healing Ointment for itching     No facility-administered medications prior to visit.    Social History   Social History  . Marital Status: Single    Spouse Name: N/A  . Number of Children: N/A  . Years of  Education: N/A   Social History Main Topics  . Smoking status: Never Smoker   . Smokeless tobacco: Never Used  . Alcohol Use: No  . Drug Use: No  . Sexual Activity: Not Asked   Other Topics Concern  . None   Social History Narrative     Review of Systems  Constitutional: Negative for fever, chills and appetite change.  HENT: Negative for congestion, ear pain, postnasal drip, sinus pressure and sore throat.   Eyes: Negative for pain and redness.  Respiratory: Negative for cough, shortness of breath and wheezing.   Cardiovascular: Negative for leg swelling.  Gastrointestinal: Negative for nausea, vomiting, abdominal pain, diarrhea, constipation and blood in stool.  Endocrine: Negative for polyuria.  Genitourinary: Negative for dysuria, urgency, frequency and flank pain.  Musculoskeletal: Negative for gait problem.  Skin: Negative for rash.  Neurological: Negative for weakness and headaches.  Psychiatric/Behavioral: Negative for confusion and decreased concentration. The patient is not nervous/anxious.     Objective:  BP 128/68 mmHg  Pulse 94  Temp(Src) 98 F (36.7 C) (Oral)  Resp 16  Ht _0  (1.575 m)  Wt 167 lb 6.4 oz (75.932 kg)  BMI 30.61 kg/m2  SpO2 98%  Physical Exam  Constitutional: She is oriented to person, place, and time. She appears well-developed and well-nourished.  HENT:  Head: Normocephalic and atraumatic.  Eyes: Conjunctivae are normal. Pupils are equal, round, and reactive to light.  Pulmonary/Chest: Effort normal.  Abdominal: Soft.  Musculoskeletal: She exhibits no edema.  Neurological: She is alert and oriented to person, place, and time.  Skin: Skin is dry.  Psychiatric: She has a normal mood and affect. Her behavior is normal. Thought content normal.   she has a 2 cm diameter easily reducible umbilical hernia there is no evident lesion in the umbilicus    Assessment & Plan:   [name] was seen today for was bleeding from navel.  Diagnoses  and all orders for this visit:  Umbilical hernia without obstruction and without gangrene  Osteoarthritis of left knee, unspecified osteoarthritis type  Other orders -     meloxicam (MOBIC) 7.5 MG tablet; Take 1 tablet (7.5 mg total) by mouth daily.   I am having Ms. Bender start on meloxicam. I am also having her maintain her fish oil-omega-3 fatty acids, OVER THE COUNTER MEDICATION, OVER THE COUNTER MEDICATION, fluticasone, glucose blood, lisinopril, metFORMIN, blood glucose meter kit and supplies, and GLUCOSAMINE-CHONDROITIN PO.  Meds ordered this encounter  Medications  . GLUCOSAMINE-CHONDROITIN PO    Sig: Take by mouth.  . meloxicam (MOBIC) 7.5 MG tablet    Sig: Take 1 tablet (7.5 mg total) by mouth daily.    Dispense:  30 tablet    Refill:  0   I asked her to use some Neosporin ointment in her umbilicus daily with soap and water  Appropriate red flag conditions were discussed with the patient as well as actions that should be taken.  Patient expressed his understanding.  Follow-up: Return if symptoms worsen or fail to improve.  Roselee Culver, MD

## 2015-04-14 ENCOUNTER — Telehealth: Payer: Self-pay

## 2015-04-27 ENCOUNTER — Encounter: Payer: Self-pay | Admitting: Family Medicine

## 2015-04-27 ENCOUNTER — Ambulatory Visit (INDEPENDENT_AMBULATORY_CARE_PROVIDER_SITE_OTHER): Payer: Medicare Other | Admitting: Family Medicine

## 2015-04-27 VITALS — BP 173/77 | HR 81 | Temp 98.1°F | Resp 18 | Ht 62.0 in | Wt 167.0 lb

## 2015-04-27 DIAGNOSIS — M179 Osteoarthritis of knee, unspecified: Secondary | ICD-10-CM

## 2015-04-27 DIAGNOSIS — Z23 Encounter for immunization: Secondary | ICD-10-CM | POA: Diagnosis not present

## 2015-04-27 DIAGNOSIS — M25562 Pain in left knee: Secondary | ICD-10-CM | POA: Diagnosis not present

## 2015-04-27 DIAGNOSIS — M1712 Unilateral primary osteoarthritis, left knee: Secondary | ICD-10-CM

## 2015-04-27 NOTE — Progress Notes (Addendum)
Subjective:  This chart was scribed for Catherine Ray, MD by Moises Blood, Medical Scribe. This patient was seen in room 28 and the patient's care was started 11:53 AM.    Patient ID: Catherine Ellis, female    DOB: Jun 16, 1922, 81 y.o.   MRN: [id number]  HPI Catherine Ellis is a 81 y.o. female PCP: Delman Cheadle, MD Last seen by Dr. Ouida Sills on Sept 8th. Diagnosis osteoarthritis of left knee. Started on meloxicam 7.69m QD and as well as handout on osteoarthritis. She did have xray of left knee in august with Dr. CLorelei Pont and indicated with mild to moderate OA with small joint effusion. Hinge knee brace was placed and tylenol was recommended then.   The knee pain was noticed back in August. Years ago, she had a torn meniscus and had surgery done at [facility] She can have her balance one moment and it feels like it'll give way. And it also locks up when getting out of the bed. She is unsure if it's swollen or not due to the knee brace. She usually uses a cane to move around, and states using a walker in her room. She took one tablet of meloxicam because she read that she shouldn't take it with high BP. She has taken tylenol 2 tablets once a day. She denies any recent fall or injury. She denies rash on the skin. She applies ice occasionally on the area.   She was curious if water aerobics would help her condition.   Patient Active Problem List   Diagnosis Date Noted  . Atrophic vaginitis 06/16/2014  . Chronic maxillary sinusitis 06/06/2014  . Microhematuria 06/05/2014  . Unspecified vitamin D deficiency 08/10/2013  . Vitamin B12 deficiency 06/14/2013  . Seasonal affective disorder ([place] 06/14/2013  . Fatigue 06/14/2013  . Hypertension 02/22/2013  . Other and unspecified hyperlipidemia 02/22/2013  . Hyponatremia 02/22/2013  . Memory loss 02/22/2013  . Diabetes mellitus ([place] 04/13/2012  . At risk for polypharmacy 04/13/2012  . Incontinence of urine 04/13/2012  . Anal  irritation 04/13/2012   Past Medical History  Diagnosis Date  . Arthritis   . Diabetes mellitus   . Hypertension   . Hyperlipidemia   . Allergy   . Osteopenia, senile 09/26/2012    T score - 1.6 Left femur   Past Surgical History  Procedure Laterality Date  . Back surgery      1965  . Knee surgery    . Appendectomy    . Eye surgery    . Gum surgery     No Known Allergies Prior to Admission medications   Medication Sig Start Date End Date Taking? Authorizing Provider  blood glucose meter kit and supplies KIT Pt would prefer one touch ultra.  History of controlled DM, checks glucose once daily. Deliver please 10/13/14   JDarreld Mclean MD  fish oil-omega-3 fatty acids 1000 MG capsule Take 2 g by mouth daily.    Historical Provider, MD  fluticasone (FLONASE) 50 MCG/ACT nasal spray Place 2 sprays into both nostrils at bedtime. 06/06/14   EShawnee Knapp MD  GLUCOSAMINE-CHONDROITIN PO Take by mouth.    Historical Provider, MD  glucose blood test strip Test blood sugar daily. Dx code: E11.8 06/19/14   EShawnee Knapp MD  lisinopril (PRINIVIL,ZESTRIL) 10 MG tablet Take 1 tablet (10 mg total) by mouth daily. 07/14/14   JGay FillerCopland, MD  meloxicam (MOBIC) 7.5 MG tablet Take 1 tablet (7.5 mg total) by mouth daily.  03/19/15   Roselee Culver, MD  metFORMIN (GLUCOPHAGE) 500 MG tablet TAKE 1 TABLET TWICE DAILY WITH FOOD. 10/13/14   Darreld Mclean, MD  OVER THE COUNTER MEDICATION Vit D 3 5000 iu taking daily    Historical Provider, MD  OVER THE COUNTER MEDICATION OTC Aquaph Healing Ointment for itching    Historical Provider, MD   Social History   Social History  . Marital Status: Single    Spouse Name: N/A  . Number of Children: N/A  . Years of Education: N/A   Occupational History  . Not on file.   Social History Main Topics  . Smoking status: Never Smoker   . Smokeless tobacco: Never Used  . Alcohol Use: No  . Drug Use: No  . Sexual Activity: Not on file   Other Topics Concern   . Not on file   Social History Narrative     Review of Systems  Musculoskeletal: Positive for joint swelling (left knee), arthralgias (left knee) and gait problem.  Skin: Negative for rash and wound.       Objective:   Physical Exam  Constitutional: She is oriented to person, place, and time. She appears well-developed and well-nourished. No distress.  Pulmonary/Chest: Effort normal.  Musculoskeletal:  Left knee: skin intact, no erythema no rash, 1+ edema. Minimal medial jointline tenderness as well as lateral jointline tenderness; full extension; flexion to 80 degrees with some discomfort; lacking 5-10 degrees on left knee. Negative valgus, negative varus, negative lachman, slight discomfort with mcmurray, slight crepitous  Neurological: She is alert and oriented to person, place, and time.  Skin: Skin is warm and dry. No rash noted.  Psychiatric: She has a normal mood and affect. Her behavior is normal.    Filed Vitals:   04/27/15 1133  BP: 173/77  Pulse: 81  Temp: 98.1 F (36.7 C)  Resp: 18  Height: _0  (1.575 m)  Weight: 167 lb (75.751 kg)  Body mass index is 30.54 kg/(m^2).   Risks (including but not limited to bleeding and infection, damage to surrounding/underlying tissues), benefits, and alternatives discussed for L knee aspiration with superolateral approach .  Verbal consent obtained after any questions were answered. Landmarks noted, and marked as needed. Area cleansed with Betadine x3, ethyl chloride spray for topical anesthesia.  Injected with 1cc Kenalog and 3cc Lidocaine 1% plain after no return into syringe with pull back. No complications. Bandage applied.  EBL less than 0.89m. RTC precautions and aftercare discussed in regards to injection. Some improvement in symptoms after 10 minutes.       Assessment & Plan:   Catherine HARLANis a 81y.o. female Need for prophylactic vaccination and inoculation against influenza - Plan: Flu Vaccine QUAD 36+ mos IM  given.   Left knee pain, Osteoarthritis of left knee, unspecified osteoarthritis type  - suspected underlying DJD with degenerative meniscal tear with reported mechanical symptoms. Has not experienced relief with tylenol, bracing, ice and did desire to try corticosteroid injection as above after alternatives (including orthopaedic eval) were discussed.   -injected as above without complications. Aftercare reviewed.   -if persistent symptoms next few weeks, can refer to [facility] consideration of another injection or may be candidate for visco supplementation.   -rtc precautions.   Elevated BP - possible pain component, isolated systolic elevation. Avoid NSAIDs, recheck next visit. rtc precautions.   No orders of the defined types were placed in this encounter.   Patient Instructions  Ok to  continue tylenol, water exercise as tolerated. If not improving in the next few weeks (or worsening sooner) - call me and I will be happy to refer you to [facility] orthopaedics to look at other options for your knee pain.  Return to the clinic or go to the nearest emergency room if any of your symptoms worsen or new symptoms occur.  Knee Injection, Care After Refer to this sheet in the next few weeks. These instructions provide you with information about caring for yourself after your procedure. Your health care provider may also give you more specific instructions. Your treatment has been planned according to current medical practices, but problems sometimes occur. Call your health care provider if you have any problems or questions after your procedure. WHAT TO EXPECT AFTER THE PROCEDURE After your procedure, it is common to have:  Soreness.  Warmth.  Swelling. You may have more pain, swelling, and warmth than you did before the injection. This reaction may last for about one day.  HOME CARE INSTRUCTIONS Bathing  If you were given a bandage (dressing), keep it dry until your health care  provider says it can be removed. Ask your health care provider when you can start showering or taking a bath. Managing Pain, Stiffness, and Swelling  If directed, apply ice to the injection area:  Put ice in a plastic bag.  Place a towel between your skin and the bag.  Leave the ice on for 20 minutes, 2-3 times per day.  Do not apply heat to your knee.  Raise the injection area above the level of your heart while you are sitting or lying down. Activity  Avoid strenuous activities for as long as directed by your health care provider. Ask your health care provider when you can return to your normal activities. General Instructions  Take medicines only as directed by your health care provider.  Do not take aspirin or other over-the-counter medicines unless your health care provider says you can.  Check your injection site every day for signs of infection. Watch for:  Redness, swelling, or pain.  Fluid, blood, or pus.  Follow your health care provider's instructions about dressing changes and removal. SEEK MEDICAL CARE IF:  You have symptoms at your injection site that last longer than two days after your procedure.  You have redness, swelling, or pain in your injection area.  You have fluid, blood, or pus coming from your injection site.  You have warmth in your injection area.  You have a fever.  Your pain is not controlled with medicine. SEEK IMMEDIATE MEDICAL CARE IF:  Your knee turns very red.  Your knee becomes very swollen.  Your knee pain is severe.   This information is not intended to replace advice given to you by your health care provider. Make sure you discuss any questions you have with your health care provider.   Document Released: 07/18/2014 Document Reviewed: 07/18/2014 Elsevier Interactive Patient Education [employer].       By signing my name below, I, Moises Blood, attest that this documentation has been prepared under the  direction and in the presence of Catherine Ray, MD. Electronically Signed: Moises Blood, [place]. 04/27/2015 , 11:53 AM . I personally performed the services described in this documentation, which was scribed in my presence. The recorded information has been reviewed and considered, and addended by me as needed.

## 2015-04-27 NOTE — Patient Instructions (Signed)
Ok to continue tylenol, water exercise as tolerated. If not improving in the next few weeks (or worsening sooner) - call me and I will be happy to refer you to [facility] orthopaedics to look at other options for your knee pain.  Return to the clinic or go to the nearest emergency room if any of your symptoms worsen or new symptoms occur.  Knee Injection, Care After Refer to this sheet in the next few weeks. These instructions provide you with information about caring for yourself after your procedure. Your health care provider may also give you more specific instructions. Your treatment has been planned according to current medical practices, but problems sometimes occur. Call your health care provider if you have any problems or questions after your procedure. WHAT TO EXPECT AFTER THE PROCEDURE After your procedure, it is common to have:  Soreness.  Warmth.  Swelling. You may have more pain, swelling, and warmth than you did before the injection. This reaction may last for about one day.  HOME CARE INSTRUCTIONS Bathing  If you were given a bandage (dressing), keep it dry until your health care provider says it can be removed. Ask your health care provider when you can start showering or taking a bath. Managing Pain, Stiffness, and Swelling  If directed, apply ice to the injection area:  Put ice in a plastic bag.  Place a towel between your skin and the bag.  Leave the ice on for 20 minutes, 2-3 times per day.  Do not apply heat to your knee.  Raise the injection area above the level of your heart while you are sitting or lying down. Activity  Avoid strenuous activities for as long as directed by your health care provider. Ask your health care provider when you can return to your normal activities. General Instructions  Take medicines only as directed by your health care provider.  Do not take aspirin or other over-the-counter medicines unless your health care provider says  you can.  Check your injection site every day for signs of infection. Watch for:  Redness, swelling, or pain.  Fluid, blood, or pus.  Follow your health care provider's instructions about dressing changes and removal. SEEK MEDICAL CARE IF:  You have symptoms at your injection site that last longer than two days after your procedure.  You have redness, swelling, or pain in your injection area.  You have fluid, blood, or pus coming from your injection site.  You have warmth in your injection area.  You have a fever.  Your pain is not controlled with medicine. SEEK IMMEDIATE MEDICAL CARE IF:  Your knee turns very red.  Your knee becomes very swollen.  Your knee pain is severe.   This information is not intended to replace advice given to you by your health care provider. Make sure you discuss any questions you have with your health care provider.   Document Released: 07/18/2014 Document Reviewed: 07/18/2014 Elsevier Interactive Patient Education [employer].

## 2015-04-28 DIAGNOSIS — H353132 Nonexudative age-related macular degeneration, bilateral, intermediate dry stage: Secondary | ICD-10-CM | POA: Diagnosis not present

## 2015-04-28 DIAGNOSIS — H52203 Unspecified astigmatism, bilateral: Secondary | ICD-10-CM | POA: Diagnosis not present

## 2015-04-28 DIAGNOSIS — Z961 Presence of intraocular lens: Secondary | ICD-10-CM | POA: Diagnosis not present

## 2015-04-28 DIAGNOSIS — E119 Type 2 diabetes mellitus without complications: Secondary | ICD-10-CM | POA: Diagnosis not present

## 2015-04-28 LAB — HM DIABETES EYE EXAM

## 2015-06-02 ENCOUNTER — Telehealth: Payer: Self-pay

## 2015-06-02 DIAGNOSIS — M1712 Unilateral primary osteoarthritis, left knee: Secondary | ICD-10-CM

## 2015-06-02 DIAGNOSIS — M25562 Pain in left knee: Secondary | ICD-10-CM

## 2015-06-02 NOTE — Telephone Encounter (Signed)
Noted. Thanks.

## 2015-06-02 NOTE — Telephone Encounter (Signed)
Referral made to [facility]  FYI Dr. Carlota Raspberry

## 2015-06-02 NOTE — Telephone Encounter (Signed)
Dr Carlota Raspberry  Patient is requesting a referral for her knee.  The injection worked for a little while.    [phone]

## 2015-06-24 DIAGNOSIS — M25562 Pain in left knee: Secondary | ICD-10-CM | POA: Diagnosis not present

## 2015-06-24 DIAGNOSIS — M25552 Pain in left hip: Secondary | ICD-10-CM | POA: Diagnosis not present

## 2015-08-03 ENCOUNTER — Ambulatory Visit (INDEPENDENT_AMBULATORY_CARE_PROVIDER_SITE_OTHER): Payer: Medicare Other | Admitting: Family Medicine

## 2015-08-03 VITALS — BP 126/72 | HR 83 | Temp 97.6°F | Resp 20 | Ht 62.0 in | Wt 169.0 lb

## 2015-08-03 DIAGNOSIS — B029 Zoster without complications: Secondary | ICD-10-CM

## 2015-08-03 DIAGNOSIS — E118 Type 2 diabetes mellitus with unspecified complications: Secondary | ICD-10-CM | POA: Diagnosis not present

## 2015-08-03 LAB — POCT GLYCOSYLATED HEMOGLOBIN (HGB A1C): Hemoglobin A1C: 6.5

## 2015-08-03 MED ORDER — VALACYCLOVIR HCL 1 G PO TABS: 1000.0000 mg | ORAL_TABLET | Freq: Three times a day (TID) | ORAL | Status: DC

## 2015-08-03 NOTE — Patient Instructions (Signed)
It does appear that your have shingles- take the valtrex three times a day for one week Your A1c shows that your diabetes is under good control- continue your metformin but you do not need to check your blood sugar at home   Let me know if your rash is not better soon Avoid pregnant women and babies under 1-2 years

## 2015-08-03 NOTE — Progress Notes (Signed)
[facility] [address] [place] [address] [phone]  Date:  08/03/2015   Name:  Catherine Ellis   DOB:  1921/11/20   MRN:  [id number]  PCP:  Delman Cheadle, MD    Chief Complaint: Herpes Zoster   History of Present Illness:  Catherine Ellis is a 82 y.o. very pleasant female patient who presents with the following:  Elderly lady here today with complaint of possible shingles. She has noted a break out on her right sided chest for the last 2 days which is stinging and feels strange to her.  She did have the shingles shot.  Otherwise she feels well  She has DM but has a hard time checking her sugar at home- she wonders if this is really necessary at this point,  She is on metformin and her DM has been well controlled.  Although she is 22 her GFR is over 80 still so she may continue metformin  Lab Results  Component Value Date   HGBA1C 6.4* 02/16/2015     Patient Active Problem List   Diagnosis Date Noted  . Atrophic vaginitis 06/16/2014  . Chronic maxillary sinusitis 06/06/2014  . Microhematuria 06/05/2014  . Unspecified vitamin D deficiency 08/10/2013  . Vitamin B12 deficiency 06/14/2013  . Seasonal affective disorder ([place]) 06/14/2013  . Fatigue 06/14/2013  . Hypertension 02/22/2013  . Other and unspecified hyperlipidemia 02/22/2013  . Hyponatremia 02/22/2013  . Memory loss 02/22/2013  . Diabetes mellitus ([place]) 04/13/2012  . At risk for polypharmacy 04/13/2012  . Incontinence of urine 04/13/2012  . Anal irritation 04/13/2012    Past Medical History  Diagnosis Date  . Arthritis   . Diabetes mellitus   . Hypertension   . Hyperlipidemia   . Allergy   . Osteopenia, senile 09/26/2012    T score - 1.6 Left femur    Past Surgical History  Procedure Laterality Date  . Back surgery      1965  . Knee surgery    . Appendectomy    . Eye surgery    . Gum surgery      Social History  Substance Use Topics  . Smoking status: Never Smoker    . Smokeless tobacco: Never Used  . Alcohol Use: No    Family History  Problem Relation Age of Onset  . Heart disease Father     No Known Allergies  Medication list has been reviewed and updated.  Current Outpatient Prescriptions on File Prior to Visit  Medication Sig Dispense Refill  . blood glucose meter kit and supplies KIT Pt would prefer one touch ultra.  History of controlled DM, checks glucose once daily. Deliver please 1 each 0  . fish oil-omega-3 fatty acids 1000 MG capsule Take 2 g by mouth daily.    . fluticasone (FLONASE) 50 MCG/ACT nasal spray Place 2 sprays into both nostrils at bedtime. 16 g 2  . GLUCOSAMINE-CHONDROITIN PO Take by mouth.    Marland Kitchen glucose blood test strip Test blood sugar daily. Dx code: E11.8 100 each 3  . lisinopril (PRINIVIL,ZESTRIL) 10 MG tablet Take 1 tablet (10 mg total) by mouth daily. 30 tablet 11  . meloxicam (MOBIC) 7.5 MG tablet Take 1 tablet (7.5 mg total) by mouth daily. 30 tablet 0  . metFORMIN (GLUCOPHAGE) 500 MG tablet TAKE 1 TABLET TWICE DAILY WITH FOOD. 60 tablet 11  . OVER THE COUNTER MEDICATION Vit D 3 5000 iu taking daily    . OVER THE  COUNTER MEDICATION OTC Aquaph Healing Ointment for itching     No current facility-administered medications on file prior to visit.    Review of Systems:  As per HPI- otherwise negative.   Physical Examination: Filed Vitals:   08/03/15 1459  BP: 126/72  Pulse: 83  Temp: 97.6 F (36.4 C)  Resp: 20   Filed Vitals:   08/03/15 1459  Height: '5\' 2"'  (1.575 m)  Weight: 169 lb (76.658 kg)   Body mass index is 30.9 kg/(m^2). Ideal Body Weight: Weight in (lb) to have BMI = 25: 136.4  GEN: WDWN, NAD, Non-toxic, A & O x 3, obese, looks well HEENT: Atraumatic, Normocephalic. Neck supple. No masses, No LAD. Ears and Nose: No external deformity. CV: RRR, No M/G/R. No JVD. No thrill. No extra heart sounds. PULM: CTA B, no wheezes, crackles, rhonchi. No retractions. No resp. distress. No accessory  muscle use. EXTR: No c/c/e NEURO Normal gait.  PSYCH: Normally interactive. Conversant. Not depressed or anxious appearing.  Calm demeanor.  Likely mild shingles rash on the anterior chest right side only   Results for orders placed or performed in visit on 08/03/15  POCT glycosylated hemoglobin (Hb A1C)  Result Value Ref Range   Hemoglobin A1C 6.5     Assessment and Plan: Shingles - Plan: valACYclovir (VALTREX) 1000 MG tablet  Controlled type 2 diabetes mellitus with complication, without long-term current use of insulin (HCC) - Plan: POCT glycosylated hemoglobin (Hb A1C)  Treat shingles with valtrex TID for one week Reassured that her DM is under good control- she does not need to monitor her home glucose readings. She is pleased.  Follow-up in about 6 months   Signed Lamar Blinks, MD

## 2015-08-05 ENCOUNTER — Other Ambulatory Visit: Payer: Self-pay | Admitting: Family Medicine

## 2015-08-06 ENCOUNTER — Telehealth: Payer: Self-pay

## 2015-08-06 ENCOUNTER — Other Ambulatory Visit: Payer: Self-pay | Admitting: *Deleted

## 2015-08-06 DIAGNOSIS — I10 Essential (primary) hypertension: Secondary | ICD-10-CM

## 2015-08-06 MED ORDER — LISINOPRIL 10 MG PO TABS
10.0000 mg | ORAL_TABLET | Freq: Every day | ORAL | Status: DC
Start: 2015-08-06 — End: 2015-08-06

## 2015-08-06 MED ORDER — LISINOPRIL 10 MG PO TABS: 10.0000 mg | ORAL_TABLET | Freq: Every day | ORAL | Status: DC

## 2015-08-06 NOTE — Telephone Encounter (Signed)
Pt is wanting to get a call back from dr copland about why it has taken so long to get her medication refilled   Best number [phone]

## 2015-08-06 NOTE — Telephone Encounter (Signed)
Rx sent 

## 2015-08-06 NOTE — Telephone Encounter (Signed)
Pt's rep called to check the status//

## 2015-08-07 ENCOUNTER — Other Ambulatory Visit: Payer: Self-pay

## 2015-08-07 DIAGNOSIS — I10 Essential (primary) hypertension: Secondary | ICD-10-CM

## 2015-08-07 MED ORDER — LISINOPRIL 10 MG PO TABS: 10.0000 mg | ORAL_TABLET | Freq: Every day | ORAL | Status: DC

## 2015-08-10 ENCOUNTER — Other Ambulatory Visit: Payer: Self-pay | Admitting: *Deleted

## 2015-08-10 DIAGNOSIS — I10 Essential (primary) hypertension: Secondary | ICD-10-CM

## 2015-08-10 MED ORDER — LISINOPRIL 10 MG PO TABS: 10.0000 mg | ORAL_TABLET | Freq: Every day | ORAL | Status: DC

## 2015-08-10 NOTE — Telephone Encounter (Signed)
rx called into [facility], it would not escribe.

## 2015-08-12 ENCOUNTER — Ambulatory Visit: Payer: Medicare Other | Admitting: Family Medicine

## 2015-08-12 DIAGNOSIS — M25562 Pain in left knee: Secondary | ICD-10-CM | POA: Diagnosis not present

## 2015-08-19 ENCOUNTER — Ambulatory Visit: Payer: Medicare Other | Admitting: Family Medicine

## 2015-09-07 ENCOUNTER — Ambulatory Visit (INDEPENDENT_AMBULATORY_CARE_PROVIDER_SITE_OTHER): Payer: Medicare Other

## 2015-09-07 ENCOUNTER — Ambulatory Visit (INDEPENDENT_AMBULATORY_CARE_PROVIDER_SITE_OTHER): Payer: Medicare Other | Admitting: Family Medicine

## 2015-09-07 VITALS — BP 128/70 | HR 100 | Temp 99.7°F | Resp 18 | Ht 62.0 in | Wt 167.0 lb

## 2015-09-07 DIAGNOSIS — R05 Cough: Secondary | ICD-10-CM

## 2015-09-07 DIAGNOSIS — N390 Urinary tract infection, site not specified: Secondary | ICD-10-CM

## 2015-09-07 DIAGNOSIS — R509 Fever, unspecified: Secondary | ICD-10-CM | POA: Diagnosis not present

## 2015-09-07 DIAGNOSIS — R059 Cough, unspecified: Secondary | ICD-10-CM

## 2015-09-07 DIAGNOSIS — R1010 Upper abdominal pain, unspecified: Secondary | ICD-10-CM | POA: Diagnosis not present

## 2015-09-07 DIAGNOSIS — A499 Bacterial infection, unspecified: Secondary | ICD-10-CM | POA: Diagnosis not present

## 2015-09-07 DIAGNOSIS — R109 Unspecified abdominal pain: Secondary | ICD-10-CM

## 2015-09-07 LAB — POCT URINALYSIS DIP (MANUAL ENTRY)
Bilirubin, UA: NEGATIVE
Glucose, UA: NEGATIVE
Ketones, POC UA: NEGATIVE
Nitrite, UA: NEGATIVE
Protein Ur, POC: NEGATIVE
Spec Grav, UA: 1.015
Urobilinogen, UA: 0.2
pH, UA: 5

## 2015-09-07 LAB — POCT INFLUENZA A/B
Influenza A, POC: NEGATIVE
Influenza B, POC: NEGATIVE

## 2015-09-07 LAB — POC MICROSCOPIC URINALYSIS (UMFC): Mucus: ABSENT

## 2015-09-07 MED ORDER — BENZONATATE 100 MG PO CAPS: 200.0000 mg | ORAL_CAPSULE | Freq: Two times a day (BID) | ORAL | Status: DC | PRN

## 2015-09-07 MED ORDER — CEPHALEXIN 500 MG PO CAPS: 500.0000 mg | ORAL_CAPSULE | Freq: Two times a day (BID) | ORAL | Status: DC

## 2015-09-07 NOTE — Progress Notes (Signed)
Chief Complaint:  Chief Complaint  Patient presents with  . Cough    getting worse today  . Rash    possible shingles    HPI: Catherine Ellis is a 82 y.o. female who reports to [facility] today complaining of cough today regular , worse. Temp 99.6 She ahs had PNA and flu vaccine. Lives at [facility]. Sh ehas a fever blister dor 3-4 days.  Dry cough. She ahs DM . She has HTN. She would like cough medicine, was not able tog et that at [facility] She has rash on her right upper chest, rash pain in last 3-4 days.  Has hx of shingles on right side of her  chest.   No urinary sxs, she has had increase frequency  Past Medical History  Diagnosis Date  . Arthritis   . Diabetes mellitus   . Hypertension   . Hyperlipidemia   . Allergy   . Osteopenia, senile 09/26/2012    T score - 1.6 Left femur   Past Surgical History  Procedure Laterality Date  . Back surgery      1965  . Knee surgery    . Appendectomy    . Eye surgery    . Gum surgery     Social History   Social History  . Marital Status: Single    Spouse Name: N/A  . Number of Children: N/A  . Years of Education: N/A   Social History Main Topics  . Smoking status: Never Smoker   . Smokeless tobacco: Never Used  . Alcohol Use: No  . Drug Use: No  . Sexual Activity: Not Asked   Other Topics Concern  . None   Social History Narrative   Family History  Problem Relation Age of Onset  . Heart disease Father    No Known Allergies Prior to Admission medications   Medication Sig Start Date End Date Taking? Authorizing Provider  blood glucose meter kit and supplies KIT Pt would prefer one touch ultra.  History of controlled DM, checks glucose once daily. Deliver please 10/13/14  Yes Gay Filler Copland, MD  fish oil-omega-3 fatty acids 1000 MG capsule Take 2 g by mouth daily.   Yes Historical Provider, MD  fluticasone (FLONASE) 50 MCG/ACT nasal spray Place 2 sprays into both nostrils at bedtime. 06/06/14  Yes Shawnee Knapp, MD  GLUCOSAMINE-CHONDROITIN PO Take by mouth.   Yes Historical Provider, MD  glucose blood test strip Test blood sugar daily. Dx code: E11.8 06/19/14  Yes Shawnee Knapp, MD  lisinopril (PRINIVIL,ZESTRIL) 10 MG tablet Take 1 tablet (10 mg total) by mouth daily. 08/10/15  Yes Gay Filler Copland, MD  metFORMIN (GLUCOPHAGE) 500 MG tablet TAKE 1 TABLET TWICE DAILY WITH FOOD. 10/13/14  Yes Gay Filler Copland, MD  OVER THE COUNTER MEDICATION Vit D 3 5000 iu taking daily   Yes Historical Provider, MD  OVER THE COUNTER MEDICATION OTC Aquaph Healing Ointment for itching   Yes Historical Provider, MD  meloxicam (MOBIC) 7.5 MG tablet Take 1 tablet (7.5 mg total) by mouth daily. Patient not taking: Reported on 09/07/2015 03/19/15   Roselee Culver, MD  valACYclovir (VALTREX) 1000 MG tablet Take 1 tablet (1,000 mg total) by mouth 3 (three) times daily. Patient not taking: Reported on 09/07/2015 08/03/15   Darreld Mclean, MD     ROS: The patient denies fevers, chills, night sweats, unintentional weight loss, chest pain, palpitations, wheezing, dyspnea on exertion, nausea, vomiting,  abdominal pain,  hematuria, melena, numbness, weakness, or tingling.  All other systems have been reviewed and were otherwise negative with the exception of those mentioned in the HPI and as above.    PHYSICAL EXAM: Filed Vitals:   09/07/15 1412  BP: 128/70  Pulse: 100  Temp: 99.7 F (37.6 C)  Resp: 18   Body mass index is 30.54 kg/(m^2).   General: Alert, no acute distress HEENT:  Normocephalic, atraumatic, oropharynx patent. EOMI, PERRLA Cardiovascular:  Regular rate and rhythm, no rubs murmurs or gallops.  No Carotid bruits, radial pulse intact. No pedal edema.  Respiratory: Clear to auscultation bilaterally.  No wheezes, rales, or rhonchi.  No cyanosis, no use of accessory musculature Abdominal: No organomegaly, abdomen is soft and non-tender, positive bowel sounds. No masses. Skin: No rashes. Neurologic: Facial  musculature symmetric. Psychiatric: Patient acts appropriately throughout our interaction. Lymphatic: No cervical or submandibular lymphadenopathy Musculoskeletal: Gait intact. No edema, tenderness   LABS: Results for orders placed or performed in visit on 09/07/15  POCT Microscopic Urinalysis ([facility])  Result Value Ref Range   WBC,UR,HPF,POC Few (A) None WBC/hpf   RBC,UR,HPF,POC Few (A) None RBC/hpf   Bacteria None None, Too numerous to count   Mucus Absent Absent   Epithelial Cells, UR Per Microscopy Few (A) None, Too numerous to count cells/hpf  POCT urinalysis dipstick  Result Value Ref Range   Color, UA yellow yellow   Clarity, UA clear clear   Glucose, UA negative negative   Bilirubin, UA negative negative   Ketones, POC UA negative negative   Spec Grav, UA 1.015    Blood, UA small (A) negative   pH, UA 5.0    Protein Ur, POC negative negative   Urobilinogen, UA 0.2    Nitrite, UA Negative Negative   Leukocytes, UA Trace (A) Negative  POCT Influenza A/B  Result Value Ref Range   Influenza A, POC Negative Negative   Influenza B, POC Negative Negative     EKG/XRAY:   IMPRESSION: Chronic lung changes but no acute pulmonary findings.   Electronically Signed  By: Marijo Sanes M.D.  On: 09/07/2015 15:56   ASSESSMENT/PLAN: Encounter Diagnoses  Name Primary?  . Cough Yes  . Temperature elevated   . Acute flank pain UTI    Viral illness with cough UTI will rx Keflex, this will take care of  Bacterial URI as well Flu neg, Chest xray neg Rx Tessalon perles, declines anything stronger Urine cx pending Fu prn   Gross sideeffects, risk and benefits, and alternatives of medications d/w patient. Patient is aware that all medications have potential sideeffects and we are unable to predict every sideeffect or drug-drug interaction that may occur.  Aleck Locklin DO  09/07/2015 4:24 PM

## 2015-09-07 NOTE — Patient Instructions (Signed)
Benzonatate capsules  What is this medicine?  BENZONATATE (ben ZOE na tate) is used to treat cough.  This medicine may be used for other purposes; ask your health care provider or pharmacist if you have questions.  What should I tell my health care provider before I take this medicine?  They need to know if you have any of these conditions:  -kidney or liver disease  -an unusual or allergic reaction to benzonatate, anesthetics, other medicines, foods, dyes, or preservatives  -pregnant or trying to get pregnant  -breast-feeding  How should I use this medicine?  Take this medicine by mouth with a glass of water. Follow the directions on the prescription label. Avoid breaking, chewing, or sucking the capsule, as this can cause serious side effects. Take your medicine at regular intervals. Do not take your medicine more often than directed.  Talk to your pediatrician regarding the use of this medicine in children. While this drug may be prescribed for children as young as 10 years old for selected conditions, precautions do apply.  Overdosage: If you think you have taken too much of this medicine contact a poison control center or emergency room at once.  NOTE: This medicine is only for you. Do not share this medicine with others.  What if I miss a dose?  If you miss a dose, take it as soon as you can. If it is almost time for your next dose, take only that dose. Do not take double or extra doses.  What may interact with this medicine?  Do not take this medicine with any of the following medications:  -MAOIs like Carbex, Eldepryl, Marplan, Nardil, and Parnate  This list may not describe all possible interactions. Give your health care provider a list of all the medicines, herbs, non-prescription drugs, or dietary supplements you use. Also tell them if you smoke, drink alcohol, or use illegal drugs. Some items may interact with your medicine.  What should I watch for while using this medicine?  Tell your doctor if your  symptoms do not improve or if they get worse. If you have a high fever, skin rash, or headache, see your health care professional.  You may get drowsy or dizzy. Do not drive, use machinery, or do anything that needs mental alertness until you know how this medicine affects you. Do not sit or stand up quickly, especially if you are an older patient. This reduces the risk of dizzy or fainting spells.  What side effects may I notice from receiving this medicine?  Side effects that you should report to your doctor or health care professional as soon as possible:  -allergic reactions like skin rash, itching or hives, swelling of the face, lips, or tongue  -breathing problems  -chest pain  -confusion or hallucinations  -irregular heartbeat  -numbness of mouth or throat  -seizures  Side effects that usually do not require medical attention (report to your doctor or health care professional if they continue or are bothersome):  -burning feeling in the eyes  -constipation  -headache  -nasal congestion  -stomach upset  This list may not describe all possible side effects. Call your doctor for medical advice about side effects. You may report side effects to FDA at 1-800-FDA-1088.  Where should I keep my medicine?  Keep out of the reach of children.  Store at room temperature between 15 and 30 degrees C (59 and 86 degrees F). Keep tightly closed. Protect from light and moisture. Throw away any   14:52:56) Cephalexin tablets or capsules What is this medicine? CEPHALEXIN (sef a LEX in) is a cephalosporin antibiotic. It is used to treat certain kinds of bacterial infections It will not work for colds, flu, or other viral infections. This medicine may be used for  other purposes; ask your health care provider or pharmacist if you have questions. What should I tell my health care provider before I take this medicine? They need to know if you have any of these conditions: -kidney disease -stomach or intestine problems, especially colitis -an unusual or allergic reaction to cephalexin, other cephalosporins, penicillins, other antibiotics, medicines, foods, dyes or preservatives -pregnant or trying to get pregnant -breast-feeding How should I use this medicine? Take this medicine by mouth with a full glass of water. Follow the directions on the prescription label. This medicine can be taken with or without food. Take your medicine at regular intervals. Do not take your medicine more often than directed. Take all of your medicine as directed even if you think you are better. Do not skip doses or stop your medicine early. Talk to your pediatrician regarding the use of this medicine in children. While this drug may be prescribed for selected conditions, precautions do apply. Overdosage: If you think you have taken too much of this medicine contact a poison control center or emergency room at once. NOTE: This medicine is only for you. Do not share this medicine with others. What if I miss a dose? If you miss a dose, take it as soon as you can. If it is almost time for your next dose, take only that dose. Do not take double or extra doses. There should be at least 4 to 6 hours between doses. What may interact with this medicine? -probenecid -some other antibiotics This list may not describe all possible interactions. Give your health care provider a list of all the medicines, herbs, non-prescription drugs, or dietary supplements you use. Also tell them if you smoke, drink alcohol, or use illegal drugs. Some items may interact with your medicine. What should I watch for while using this medicine? Tell your doctor or health care professional if your symptoms do not  begin to improve in a few days. Do not treat diarrhea with over the counter products. Contact your doctor if you have diarrhea that lasts more than 2 days or if it is severe and watery. If you have diabetes, you may get a false-positive result for sugar in your urine. Check with your doctor or health care professional. What side effects may I notice from receiving this medicine? Side effects that you should report to your doctor or health care professional as soon as possible: -allergic reactions like skin rash, itching or hives, swelling of the face, lips, or tongue -breathing problems -pain or trouble passing urine -redness, blistering, peeling or loosening of the skin, including inside the mouth -severe or watery diarrhea -unusually weak or tired -yellowing of the eyes, skin Side effects that usually do not require medical attention (report to your doctor or health care professional if they continue or are bothersome): -gas or heartburn -genital or anal irritation -headache -joint or muscle pain -nausea, vomiting This list may not describe all possible side effects. Call your doctor for medical advice about side effects. You may report side effects to FDA at 1-800-FDA-1088. Where should I keep my medicine? Keep out of the reach of children. Store at room temperature between 59 and 86 degrees F (15 and 30 degrees C). Throw away  any unused medicine after the expiration date. NOTE: This sheet is a summary. It may not cover all possible information. If you have questions about this medicine, talk to your doctor, pharmacist, or health care provider.    2016, Elsevier/[employer]. (2007-10-01 17:09:13)

## 2015-09-08 ENCOUNTER — Telehealth: Payer: Self-pay | Admitting: *Deleted

## 2015-09-08 ENCOUNTER — Inpatient Hospital Stay (HOSPITAL_COMMUNITY)
Admission: EM | Admit: 2015-09-08 | Discharge: 2015-09-10 | DRG: 871 | Disposition: A | Discharge status: Nursing Home (Includes ICF) | Payer: Medicare Other | Attending: Family Medicine | Admitting: Family Medicine

## 2015-09-08 ENCOUNTER — Encounter (HOSPITAL_COMMUNITY): Payer: Self-pay

## 2015-09-08 ENCOUNTER — Emergency Department (HOSPITAL_COMMUNITY): Payer: Medicare Other

## 2015-09-08 DIAGNOSIS — J189 Pneumonia, unspecified organism: Secondary | ICD-10-CM

## 2015-09-08 DIAGNOSIS — Z66 Do not resuscitate: Secondary | ICD-10-CM | POA: Diagnosis present

## 2015-09-08 DIAGNOSIS — E785 Hyperlipidemia, unspecified: Secondary | ICD-10-CM | POA: Diagnosis present

## 2015-09-08 DIAGNOSIS — R059 Cough, unspecified: Secondary | ICD-10-CM

## 2015-09-08 DIAGNOSIS — A419 Sepsis, unspecified organism: Secondary | ICD-10-CM | POA: Diagnosis present

## 2015-09-08 DIAGNOSIS — Z79899 Other long term (current) drug therapy: Secondary | ICD-10-CM

## 2015-09-08 DIAGNOSIS — M1990 Unspecified osteoarthritis, unspecified site: Secondary | ICD-10-CM | POA: Diagnosis present

## 2015-09-08 DIAGNOSIS — E119 Type 2 diabetes mellitus without complications: Secondary | ICD-10-CM | POA: Diagnosis not present

## 2015-09-08 DIAGNOSIS — E860 Dehydration: Secondary | ICD-10-CM | POA: Diagnosis present

## 2015-09-08 DIAGNOSIS — I10 Essential (primary) hypertension: Secondary | ICD-10-CM | POA: Diagnosis present

## 2015-09-08 DIAGNOSIS — Z7984 Long term (current) use of oral hypoglycemic drugs: Secondary | ICD-10-CM

## 2015-09-08 DIAGNOSIS — R531 Weakness: Secondary | ICD-10-CM

## 2015-09-08 DIAGNOSIS — Z6830 Body mass index (BMI) 30.0-30.9, adult: Secondary | ICD-10-CM

## 2015-09-08 DIAGNOSIS — E871 Hypo-osmolality and hyponatremia: Secondary | ICD-10-CM | POA: Diagnosis present

## 2015-09-08 DIAGNOSIS — Z7951 Long term (current) use of inhaled steroids: Secondary | ICD-10-CM

## 2015-09-08 DIAGNOSIS — R05 Cough: Secondary | ICD-10-CM | POA: Diagnosis not present

## 2015-09-08 DIAGNOSIS — N390 Urinary tract infection, site not specified: Secondary | ICD-10-CM | POA: Diagnosis present

## 2015-09-08 DIAGNOSIS — Z791 Long term (current) use of non-steroidal anti-inflammatories (NSAID): Secondary | ICD-10-CM | POA: Diagnosis not present

## 2015-09-08 DIAGNOSIS — R509 Fever, unspecified: Secondary | ICD-10-CM | POA: Diagnosis present

## 2015-09-08 DIAGNOSIS — E669 Obesity, unspecified: Secondary | ICD-10-CM | POA: Diagnosis present

## 2015-09-08 LAB — CBC WITH DIFFERENTIAL/PLATELET
Basophils Absolute: 0 10*3/uL (ref 0.0–0.1)
Basophils Relative: 0 %
Eosinophils Absolute: 0 10*3/uL (ref 0.0–0.7)
Eosinophils Relative: 0 %
HCT: 39.5 % (ref 36.0–46.0)
Hemoglobin: 13.6 g/dL (ref 12.0–15.0)
Lymphocytes Relative: 8 %
Lymphs Abs: 0.8 10*3/uL (ref 0.7–4.0)
MCH: 31.5 pg (ref 26.0–34.0)
MCHC: 34.4 g/dL (ref 30.0–36.0)
MCV: 91.4 fL (ref 78.0–100.0)
Monocytes Absolute: 0.8 10*3/uL (ref 0.1–1.0)
Monocytes Relative: 8 %
Neutro Abs: 8.7 10*3/uL — ABNORMAL HIGH (ref 1.7–7.7)
Neutrophils Relative %: 84 %
Platelets: 269 10*3/uL (ref 150–400)
RBC: 4.32 MIL/uL (ref 3.87–5.11)
RDW: 13.5 % (ref 11.5–15.5)
WBC: 10.3 10*3/uL (ref 4.0–10.5)

## 2015-09-08 LAB — COMPREHENSIVE METABOLIC PANEL
ALT: 15 U/L (ref 14–54)
AST: 21 U/L (ref 15–41)
Albumin: 3.9 g/dL (ref 3.5–5.0)
Alkaline Phosphatase: 47 U/L (ref 38–126)
Anion gap: 12 (ref 5–15)
BUN: 19 mg/dL (ref 6–20)
CO2: 20 mmol/L — ABNORMAL LOW (ref 22–32)
Calcium: 8.9 mg/dL (ref 8.9–10.3)
Chloride: 99 mmol/L — ABNORMAL LOW (ref 101–111)
Creatinine, Ser: 0.92 mg/dL (ref 0.44–1.00)
GFR calc Af Amer: >60 mL/min (ref >60)
GFR calc non Af Amer: 53 mL/min — ABNORMAL LOW (ref >60)
Glucose, Bld: 223 mg/dL — ABNORMAL HIGH (ref 65–99)
Potassium: 3.7 mmol/L (ref 3.5–5.1)
Sodium: 131 mmol/L — ABNORMAL LOW (ref 135–145)
Total Bilirubin: 1.1 mg/dL (ref 0.3–1.2)
Total Protein: 7.4 g/dL (ref 6.5–8.1)

## 2015-09-08 LAB — URINE MICROSCOPIC-ADD ON

## 2015-09-08 LAB — URINALYSIS, ROUTINE W REFLEX MICROSCOPIC
Bilirubin Urine: NEGATIVE
Glucose, UA: NEGATIVE mg/dL
Ketones, ur: 40 mg/dL — AB
Leukocytes, UA: NEGATIVE
Nitrite: NEGATIVE
Protein, ur: 100 mg/dL — AB
Specific Gravity, Urine: 1.026 (ref 1.005–1.030)
pH: 6 (ref 5.0–8.0)

## 2015-09-08 LAB — URINE CULTURE: Colony Count: 5000

## 2015-09-08 LAB — I-STAT CG4 LACTIC ACID, ED
Lactic Acid, Venous: 1.57 mmol/L (ref 0.5–2.0)
Lactic Acid, Venous: 2.07 mmol/L (ref 0.5–2.0)

## 2015-09-08 MED ORDER — AZITHROMYCIN 500 MG IV SOLR
500.0000 mg | Freq: Once | INTRAVENOUS | Status: AC
  Administered 2015-09-08: 500 mg via INTRAVENOUS
  Filled 2015-09-08: qty 500

## 2015-09-08 MED ORDER — CEFTRIAXONE SODIUM 1 G IJ SOLR
1.0000 g | Freq: Once | INTRAMUSCULAR | Status: AC
  Administered 2015-09-08: 1 g via INTRAVENOUS
  Filled 2015-09-08: qty 10

## 2015-09-08 MED ORDER — SODIUM CHLORIDE 0.9 % IV BOLUS (SEPSIS)
1000.0000 mL | Freq: Once | INTRAVENOUS | Status: AC
  Administered 2015-09-08: 1000 mL via INTRAVENOUS

## 2015-09-08 NOTE — ED Provider Notes (Signed)
CSN: [id number]     Arrival date & time 09/08/15  1901 History   First MD Initiated Contact with Patient 09/08/15 1911     Chief Complaint  Patient presents with  . Fever  . Urinary Tract Infection     (Consider location/radiation/quality/duration/timing/severity/associated sxs/prior Treatment) HPI Patient poor she generally does not feel well. She feels very weak all over and nauseated. She has not have localizing pain. She states yesterday she was seen at urgent care and diagnosed with a UTI. She started ciprofloxacin yesterday. She reports that she feels no better and seems to be getting worse. Denies pain or burning with urination. She reports she has been having some coughing and nasal congestion. Today her fever was 101.8. Past Medical History  Diagnosis Date  . Arthritis   . Diabetes mellitus   . Hypertension   . Hyperlipidemia   . Allergy   . Osteopenia, senile 09/26/2012    T score - 1.6 Left femur   Past Surgical History  Procedure Laterality Date  . Back surgery      1965  . Knee surgery    . Appendectomy    . Eye surgery    . Gum surgery     Family History  Problem Relation Age of Onset  . Heart disease Father    Social History  Substance Use Topics  . Smoking status: Never Smoker   . Smokeless tobacco: Never Used  . Alcohol Use: No   OB History    No data available     Review of Systems  10 Systems reviewed and are negative for acute change except as noted in the HPI.   Allergies  Review of patient's allergies indicates no known allergies.  Home Medications   Prior to Admission medications   Medication Sig Start Date End Date Taking? Authorizing Provider  cephALEXin (KEFLEX) 500 MG capsule Take 1 capsule (500 mg total) by mouth 2 (two) times daily. 09/07/15  Yes Thao P Le, DO  Cholecalciferol (VITAMIN D PO) Take 5,000 Units by mouth daily.   Yes Historical Provider, MD  fish oil-omega-3 fatty acids 1000 MG capsule Take 2 g by mouth daily.   Yes  Historical Provider, MD  GLUCOSAMINE-CHONDROITIN PO Take 1 tablet by mouth daily.    Yes Historical Provider, MD  lisinopril (PRINIVIL,ZESTRIL) 10 MG tablet Take 1 tablet (10 mg total) by mouth daily. 08/10/15  Yes Gay Filler Copland, MD  metFORMIN (GLUCOPHAGE) 500 MG tablet TAKE 1 TABLET TWICE DAILY WITH FOOD. 10/13/14  Yes Gay Filler Copland, MD  OVER THE COUNTER MEDICATION OTC Aquaph Healing Ointment for itching   Yes Historical Provider, MD  benzonatate (TESSALON) 100 MG capsule Take 2 capsules (200 mg total) by mouth 2 (two) times daily as needed for cough. 09/07/15   Thao P Le, DO  blood glucose meter kit and supplies KIT Pt would prefer one touch ultra.  History of controlled DM, checks glucose once daily. Deliver please 10/13/14   Darreld Mclean, MD  fluticasone (FLONASE) 50 MCG/ACT nasal spray Place 2 sprays into both nostrils at bedtime. Patient not taking: Reported on 09/08/2015 06/06/14   Shawnee Knapp, MD  glucose blood test strip Test blood sugar daily. Dx code: E11.8 06/19/14   Shawnee Knapp, MD  meloxicam (MOBIC) 7.5 MG tablet Take 1 tablet (7.5 mg total) by mouth daily. Patient not taking: Reported on 09/07/2015 03/19/15   Roselee Culver, MD  valACYclovir (VALTREX) 1000 MG tablet Take 1 tablet (1,000  mg total) by mouth 3 (three) times daily. Patient not taking: Reported on 09/07/2015 08/03/15   Gay Filler Copland, MD   BP 135/45 mmHg  Pulse 78  Temp(Src) 98.8 F (37.1 C) (Oral)  Resp 22  Ht 5' 2.5" (1.588 m)  Wt 170 lb (77.111 kg)  BMI 30.58 kg/m2  SpO2 95% Physical Exam  Constitutional:  Patient appears uncomfortable and fatigued. She is nontoxic. She answers questions appropriately. She is not in acute respiratory distress at rest.  HENT:  Head: Normocephalic and atraumatic.  Mouth/Throat: Oropharynx is clear and moist.  Eyes: EOM are normal. Pupils are equal, round, and reactive to light.  Neck: Neck supple.  Cardiovascular: Normal rate, regular rhythm, normal heart sounds and  intact distal pulses.   Pulmonary/Chest: Effort normal and breath sounds normal.  Patient does develop some coughing paroxysmal deep inspiration.  Abdominal: Soft. Bowel sounds are normal. She exhibits no distension. There is no tenderness.  Musculoskeletal: Normal range of motion.  Neurological: She is alert. No cranial nerve deficit. She exhibits normal muscle tone. Coordination normal.  Skin: Skin is warm and dry.  Psychiatric: She has a normal mood and affect.    ED Course  Procedures (including critical care time) Labs Review Labs Reviewed  COMPREHENSIVE METABOLIC PANEL - Abnormal; Notable for the following:    Sodium 131 (*)    Chloride 99 (*)    CO2 20 (*)    Glucose, Bld 223 (*)    GFR calc non Af Amer 53 (*)    All other components within normal limits  CBC WITH DIFFERENTIAL/PLATELET - Abnormal; Notable for the following:    Neutro Abs 8.7 (*)    All other components within normal limits  URINALYSIS, ROUTINE W REFLEX MICROSCOPIC (NOT AT [facility]) - Abnormal; Notable for the following:    Color, Urine AMBER (*)    Hgb urine dipstick SMALL (*)    Ketones, ur 40 (*)    Protein, ur 100 (*)    All other components within normal limits  URINE MICROSCOPIC-ADD ON - Abnormal; Notable for the following:    Squamous Epithelial / LPF 0-5 (*)    Bacteria, UA RARE (*)    All other components within normal limits  COMPREHENSIVE METABOLIC PANEL - Abnormal; Notable for the following:    CO2 21 (*)    Glucose, Bld 128 (*)    Calcium 7.7 (*)    Total Protein 6.2 (*)    Albumin 3.2 (*)    ALT 12 (*)    All other components within normal limits  CBC WITH DIFFERENTIAL/PLATELET - Abnormal; Notable for the following:    RBC 3.84 (*)    HCT 35.6 (*)    All other components within normal limits  I-STAT CG4 LACTIC ACID, ED - Abnormal; Notable for the following:    Lactic Acid, Venous 2.07 (*)    All other components within normal limits  CULTURE, BLOOD (ROUTINE X 2)  CULTURE, BLOOD  (ROUTINE X 2)  URINE CULTURE  MRSA PCR SCREENING  CULTURE, EXPECTORATED SPUTUM-ASSESSMENT  GRAM STAIN  TROPONIN I  LACTIC ACID, PLASMA  LACTIC ACID, PLASMA  HIV ANTIBODY (ROUTINE TESTING)  STREP PNEUMONIAE URINARY ANTIGEN  CBC  CREATININE, SERUM  PROCALCITONIN  LEGIONELLA ANTIGEN, URINE  I-STAT CG4 LACTIC ACID, ED    Imaging Review Dg Chest 2 View  09/08/2015  CLINICAL DATA:  82 year old female with fever and sepsis EXAM: CHEST  2 VIEW COMPARISON:  Radiograph dated 09/07/2015 FINDINGS: Two views of  the chest demonstrate mild blunting of the right costophrenic angle, which may be related to a small pleural effusion. There is no focal consolidation. No pneumothorax. The cardiac silhouette is within normal limits. There is degenerative changes of the osseous structures. IMPRESSION: No focal consolidation.  Probable small right pleural effusion. Electronically Signed   By: Anner Crete M.D.   On: 09/08/2015 19:57   I have personally reviewed and evaluated these images and lab results as part of my medical decision-making.   EKG Interpretation None      MDM   Final diagnoses:  Cough  Other specified fever  Weakness   Patient presents with symptoms of cough and fever. She appears ill and uncomfortable. Blood pressures are stable. Patient is not acutely hypotensive or tachycardic. She will be started on empiric antibiotics of Rocephin and Zithromax for respiratory source of infection.    Charlesetta Shanks, MD 09/10/15 [phone]

## 2015-09-08 NOTE — ED Notes (Signed)
Bed: RESA Expected date:  Expected time:  Means of arrival:  Comments: 59F/fever

## 2015-09-08 NOTE — Telephone Encounter (Signed)
After hours called and stated that nurse Helene Kelp called and stated that pt had a fever of 101.8, congestion, and vomiting.  Advised nurse Alleen Borne that pt needs to be seen or go to hospital.  She stated that since we were closed they called 911 and was currently being transported.

## 2015-09-08 NOTE — ED Notes (Addendum)
From [facility]. Dx yesterday with UTI- started cipro. Fever today- temp of 101.8 in transit. Cbg of 190. 650mg  of tylenol given by EMS.

## 2015-09-09 DIAGNOSIS — R531 Weakness: Secondary | ICD-10-CM | POA: Diagnosis not present

## 2015-09-09 DIAGNOSIS — E119 Type 2 diabetes mellitus without complications: Secondary | ICD-10-CM | POA: Diagnosis not present

## 2015-09-09 DIAGNOSIS — I10 Essential (primary) hypertension: Secondary | ICD-10-CM | POA: Diagnosis not present

## 2015-09-09 DIAGNOSIS — N390 Urinary tract infection, site not specified: Secondary | ICD-10-CM | POA: Diagnosis not present

## 2015-09-09 DIAGNOSIS — J189 Pneumonia, unspecified organism: Secondary | ICD-10-CM | POA: Diagnosis not present

## 2015-09-09 DIAGNOSIS — E860 Dehydration: Secondary | ICD-10-CM | POA: Diagnosis not present

## 2015-09-09 DIAGNOSIS — E871 Hypo-osmolality and hyponatremia: Secondary | ICD-10-CM | POA: Diagnosis not present

## 2015-09-09 DIAGNOSIS — A419 Sepsis, unspecified organism: Secondary | ICD-10-CM | POA: Diagnosis not present

## 2015-09-09 LAB — CREATININE, SERUM
Creatinine, Ser: 0.74 mg/dL (ref 0.44–1.00)
GFR calc Af Amer: >60 mL/min (ref >60)
GFR calc non Af Amer: >60 mL/min (ref >60)

## 2015-09-09 LAB — COMPREHENSIVE METABOLIC PANEL
ALT: 12 U/L — ABNORMAL LOW (ref 14–54)
AST: 19 U/L (ref 15–41)
Albumin: 3.2 g/dL — ABNORMAL LOW (ref 3.5–5.0)
Alkaline Phosphatase: 38 U/L (ref 38–126)
Anion gap: 8 (ref 5–15)
BUN: 14 mg/dL (ref 6–20)
CO2: 21 mmol/L — ABNORMAL LOW (ref 22–32)
Calcium: 7.7 mg/dL — ABNORMAL LOW (ref 8.9–10.3)
Chloride: 106 mmol/L (ref 101–111)
Creatinine, Ser: 0.77 mg/dL (ref 0.44–1.00)
GFR calc Af Amer: >60 mL/min (ref >60)
GFR calc non Af Amer: >60 mL/min (ref >60)
Glucose, Bld: 128 mg/dL — ABNORMAL HIGH (ref 65–99)
Potassium: 3.5 mmol/L (ref 3.5–5.1)
Sodium: 135 mmol/L (ref 135–145)
Total Bilirubin: 0.7 mg/dL (ref 0.3–1.2)
Total Protein: 6.2 g/dL — ABNORMAL LOW (ref 6.5–8.1)

## 2015-09-09 LAB — CBC WITH DIFFERENTIAL/PLATELET
Basophils Absolute: 0 10*3/uL (ref 0.0–0.1)
Basophils Relative: 0 %
Eosinophils Absolute: 0 10*3/uL (ref 0.0–0.7)
Eosinophils Relative: 0 %
HCT: 35.6 % — ABNORMAL LOW (ref 36.0–46.0)
Hemoglobin: 12 g/dL (ref 12.0–15.0)
Lymphocytes Relative: 24 %
Lymphs Abs: 2 10*3/uL (ref 0.7–4.0)
MCH: 31.3 pg (ref 26.0–34.0)
MCHC: 33.7 g/dL (ref 30.0–36.0)
MCV: 92.7 fL (ref 78.0–100.0)
Monocytes Absolute: 1 10*3/uL (ref 0.1–1.0)
Monocytes Relative: 12 %
Neutro Abs: 5.5 10*3/uL (ref 1.7–7.7)
Neutrophils Relative %: 64 %
Platelets: 241 10*3/uL (ref 150–400)
RBC: 3.84 MIL/uL — ABNORMAL LOW (ref 3.87–5.11)
RDW: 13.8 % (ref 11.5–15.5)
WBC: 8.5 10*3/uL (ref 4.0–10.5)

## 2015-09-09 LAB — LACTIC ACID, PLASMA
Lactic Acid, Venous: 1.7 mmol/L (ref 0.5–2.0)
Lactic Acid, Venous: 1.5 mmol/L (ref 0.5–2.0)

## 2015-09-09 LAB — CBC
HCT: 36.6 % (ref 36.0–46.0)
Hemoglobin: 12.4 g/dL (ref 12.0–15.0)
MCH: 31 pg (ref 26.0–34.0)
MCHC: 33.9 g/dL (ref 30.0–36.0)
MCV: 91.5 fL (ref 78.0–100.0)
Platelets: 261 10*3/uL (ref 150–400)
RBC: 4 MIL/uL (ref 3.87–5.11)
RDW: 13.6 % (ref 11.5–15.5)
WBC: 9.6 10*3/uL (ref 4.0–10.5)

## 2015-09-09 LAB — TROPONIN I: Troponin I: <0.03 ng/mL (ref <0.031)

## 2015-09-09 LAB — MRSA PCR SCREENING: MRSA by PCR: NEGATIVE

## 2015-09-09 LAB — PROCALCITONIN: Procalcitonin: <0.1 ng/mL

## 2015-09-09 LAB — STREP PNEUMONIAE URINARY ANTIGEN: Strep Pneumo Urinary Antigen: NEGATIVE

## 2015-09-09 LAB — HIV ANTIBODY (ROUTINE TESTING W REFLEX): HIV Screen 4th Generation wRfx: NONREACTIVE

## 2015-09-09 MED ORDER — DEXTROSE 5 % IV SOLN
500.0000 mg | INTRAVENOUS | Status: DC
  Administered 2015-09-09: 500 mg via INTRAVENOUS
  Filled 2015-09-09 (×2): qty 500

## 2015-09-09 MED ORDER — ONDANSETRON HCL 4 MG PO TABS: 4.0000 mg | ORAL_TABLET | Freq: Four times a day (QID) | ORAL | Status: DC | PRN

## 2015-09-09 MED ORDER — BENZONATATE 100 MG PO CAPS
200.0000 mg | ORAL_CAPSULE | Freq: Three times a day (TID) | ORAL | Status: DC | PRN
  Administered 2015-09-09 – 2015-09-10 (×2): 200 mg via ORAL
  Filled 2015-09-09 (×2): qty 2

## 2015-09-09 MED ORDER — DEXTROSE 5 % IV SOLN
1.0000 g | INTRAVENOUS | Status: DC
  Administered 2015-09-09: 1 g via INTRAVENOUS
  Filled 2015-09-09 (×2): qty 10

## 2015-09-09 MED ORDER — SODIUM CHLORIDE 0.9 % IV SOLN
INTRAVENOUS | Status: DC
  Administered 2015-09-09 (×3): via INTRAVENOUS

## 2015-09-09 MED ORDER — LIP MEDEX EX OINT
TOPICAL_OINTMENT | CUTANEOUS | Status: AC
  Administered 2015-09-09: 14:00:00
  Filled 2015-09-09: qty 7

## 2015-09-09 MED ORDER — LISINOPRIL 10 MG PO TABS
10.0000 mg | ORAL_TABLET | Freq: Every day | ORAL | Status: DC
  Administered 2015-09-09 – 2015-09-10 (×2): 10 mg via ORAL
  Filled 2015-09-09 (×2): qty 1

## 2015-09-09 MED ORDER — ONDANSETRON HCL 4 MG/2ML IJ SOLN: 4.0000 mg | Freq: Four times a day (QID) | INTRAMUSCULAR | Status: DC | PRN

## 2015-09-09 MED ORDER — ACETAMINOPHEN 325 MG PO TABS
650.0000 mg | ORAL_TABLET | Freq: Four times a day (QID) | ORAL | Status: DC | PRN
  Administered 2015-09-09 – 2015-09-10 (×2): 650 mg via ORAL
  Filled 2015-09-09 (×2): qty 2

## 2015-09-09 MED ORDER — OMEGA-3-ACID ETHYL ESTERS 1 G PO CAPS
1.0000 g | ORAL_CAPSULE | Freq: Every day | ORAL | Status: DC
  Administered 2015-09-09 – 2015-09-10 (×2): 1 g via ORAL
  Filled 2015-09-09 (×2): qty 1

## 2015-09-09 MED ORDER — ACETAMINOPHEN 650 MG RE SUPP: 650.0000 mg | Freq: Four times a day (QID) | RECTAL | Status: DC | PRN

## 2015-09-09 MED ORDER — ENOXAPARIN SODIUM 40 MG/0.4ML ~~LOC~~ SOLN
40.0000 mg | SUBCUTANEOUS | Status: DC
  Administered 2015-09-09 – 2015-09-10 (×2): 40 mg via SUBCUTANEOUS
  Filled 2015-09-09 (×2): qty 0.4

## 2015-09-09 MED ORDER — SODIUM CHLORIDE 0.9 % IV BOLUS (SEPSIS)
2000.0000 mL | Freq: Once | INTRAVENOUS | Status: AC
  Administered 2015-09-09: 2000 mL via INTRAVENOUS

## 2015-09-09 MED ORDER — GUAIFENESIN-DM 100-10 MG/5ML PO SYRP
5.0000 mL | ORAL_SOLUTION | ORAL | Status: DC | PRN
  Administered 2015-09-09: 5 mL via ORAL
  Filled 2015-09-09: qty 10

## 2015-09-09 NOTE — H&P (Signed)
[facility] History and Physical  TURQUOISE ESCH LKT:[id number] DOB: [birth year] DOA: 09/08/2015  Referring physician: Dr. Vallery Ridge. PCP: Delman Cheadle, MD  Specialists: None.  Chief Complaint: Weakness.  HPI: Catherine Ellis is a 82 y.o. female with history of hypertension and diabetes mellitus type 2 presented to the ER because of persistent weakness. Patient has been having productive cough for last 3-4 days and had gone to urgent care center 2 days ago and was treated for UTI. At that time influenza PCR was negative. Since patient is still having weakness and productive cough patient was referred to the ER from her living facility. Chest x-ray was not showing any definite infiltrates. Patient to week and lactic acid was mildly elevated. Patient on exam has been having productive cough. Patient will be admitted for weakness with possible SIRS from pneumonia. Patient denies any chest pain loss of consciousness nausea vomiting or diarrhea.   Review of Systems: As presented in the history of presenting illness, rest negative.  Past Medical History  Diagnosis Date  . Arthritis   . Diabetes mellitus   . Hypertension   . Hyperlipidemia   . Allergy   . Osteopenia, senile 09/26/2012    T score - 1.6 Left femur   Past Surgical History  Procedure Laterality Date  . Back surgery      1965  . Knee surgery    . Appendectomy    . Eye surgery    . Gum surgery     Social History:  reports that she has never smoked. She has never used smokeless tobacco. She reports that she does not drink alcohol or use illicit drugs. Where does patient live nursing home. Can patient participate in ADLs? Yes.  No Known Allergies  Family History:  Family History  Problem Relation Age of Onset  . Heart disease Father       Prior to Admission medications   Medication Sig Start Date End Date Taking? Authorizing Provider  cephALEXin (KEFLEX) 500 MG capsule Take 1 capsule (500 mg total) by mouth 2 (two)  times daily. 09/07/15  Yes Thao P Le, DO  Cholecalciferol (VITAMIN D PO) Take 5,000 Units by mouth daily.   Yes Historical Provider, MD  fish oil-omega-3 fatty acids 1000 MG capsule Take 2 g by mouth daily.   Yes Historical Provider, MD  GLUCOSAMINE-CHONDROITIN PO Take 1 tablet by mouth daily.    Yes Historical Provider, MD  lisinopril (PRINIVIL,ZESTRIL) 10 MG tablet Take 1 tablet (10 mg total) by mouth daily. 08/10/15  Yes Gay Filler Copland, MD  metFORMIN (GLUCOPHAGE) 500 MG tablet TAKE 1 TABLET TWICE DAILY WITH FOOD. 10/13/14  Yes Gay Filler Copland, MD  OVER THE COUNTER MEDICATION OTC Aquaph Healing Ointment for itching   Yes Historical Provider, MD  benzonatate (TESSALON) 100 MG capsule Take 2 capsules (200 mg total) by mouth 2 (two) times daily as needed for cough. 09/07/15   Thao P Le, DO  blood glucose meter kit and supplies KIT Pt would prefer one touch ultra.  History of controlled DM, checks glucose once daily. Deliver please 10/13/14   Darreld Mclean, MD  fluticasone (FLONASE) 50 MCG/ACT nasal spray Place 2 sprays into both nostrils at bedtime. Patient not taking: Reported on 09/08/2015 06/06/14   Shawnee Knapp, MD  glucose blood test strip Test blood sugar daily. Dx code: E11.8 06/19/14   Shawnee Knapp, MD  meloxicam (MOBIC) 7.5 MG tablet Take 1 tablet (7.5 mg total) by mouth daily. Patient not  taking: Reported on 09/07/2015 03/19/15   Roselee Culver, MD  valACYclovir (VALTREX) 1000 MG tablet Take 1 tablet (1,000 mg total) by mouth 3 (three) times daily. Patient not taking: Reported on 09/07/2015 08/03/15   Darreld Mclean, MD    Physical Exam: Filed Vitals:   09/08/15 2200 09/08/15 2220 09/08/15 2300 09/08/15 2319  BP: 124/42  144/70   Pulse: 81  78   Temp:  98 F (36.7 C)  98 F (36.7 C)  TempSrc:  Oral  Oral  Resp: 27  21   SpO2: 90%  96% 97%     General:  Moderately built and nourished.  Eyes: Anicteric no pallor.  ENT: No discharge from the ears eyes nose or mouth.  Neck:  No mass felt.  Cardiovascular: S1-S2 heard.  Respiratory: No rhonchi or crepitations.  Abdomen: Soft nontender bowel sounds present. No guarding or rigidity.  Skin: No rash.  Musculoskeletal: No edema.  Psychiatric: Appears normal.  Neurologic: Alert awake oriented to time place and person. Moves all extremities.  Labs on Admission:  Basic Metabolic Panel:  Recent Labs Lab 09/08/15 1934  NA 131*  K 3.7  CL 99*  CO2 20*  GLUCOSE 223*  BUN 19  CREATININE 0.92  CALCIUM 8.9   Liver Function Tests:  Recent Labs Lab 09/08/15 1934  AST 21  ALT 15  ALKPHOS 47  BILITOT 1.1  PROT 7.4  ALBUMIN 3.9   No results for input(s): LIPASE, AMYLASE in the last 168 hours. No results for input(s): AMMONIA in the last 168 hours. CBC:  Recent Labs Lab 09/08/15 1934  WBC 10.3  NEUTROABS 8.7*  HGB 13.6  HCT 39.5  MCV 91.4  PLT 269   Cardiac Enzymes: No results for input(s): CKTOTAL, CKMB, CKMBINDEX, TROPONINI in the last 168 hours.  BNP (last 3 results) No results for input(s): BNP in the last 8760 hours.  ProBNP (last 3 results) No results for input(s): PROBNP in the last 8760 hours.  CBG: No results for input(s): GLUCAP in the last 168 hours.  Radiological Exams on Admission: Dg Chest 2 View  09/08/2015  CLINICAL DATA:  82 year old female with fever and sepsis EXAM: CHEST  2 VIEW COMPARISON:  Radiograph dated 09/07/2015 FINDINGS: Two views of the chest demonstrate mild blunting of the right costophrenic angle, which may be related to a small pleural effusion. There is no focal consolidation. No pneumothorax. The cardiac silhouette is within normal limits. There is degenerative changes of the osseous structures. IMPRESSION: No focal consolidation.  Probable small right pleural effusion. Electronically Signed   By: Anner Crete M.D.   On: 09/08/2015 19:57   Dg Chest 2 View  09/07/2015  CLINICAL DATA:  Cough and fever.  No time course given. EXAM: CHEST  2 VIEW  COMPARISON:  03/24/2014 FINDINGS: The cardiac silhouette, mediastinal and hilar contours are within normal limits and stable. There is tortuosity and calcification of the thoracic aorta. Stable mild emphysematous changes and bibasilar scarring. No infiltrates or effusions. The bony thorax is intact. IMPRESSION: Chronic lung changes but no acute pulmonary findings. Electronically Signed   By: Marijo Sanes M.D.   On: 09/07/2015 15:56     Assessment/Plan Principal Problem:   Weakness generalized Active Problems:   Hypertension   Hyponatremia   Weakness   Diabetes mellitus type 2, controlled ([place])   1. Generalized weakness/SIRS - patient generalized weakness could be from possible pneumonia given the persistent productive cough. Patient's lactic acid also elevated. For now  I have ordered 2 L normal saline bolus and normal saline infusion. Follow cultures. Patient is empirically placed on ceftriaxone and Zithromax for possible pneumonia. Influenza PCR checked 2 days ago was negative. 2. Hyponatremia probably from dehydration - hydrate and recheck metabolic panel. 3. Diabetes mellitus type 2 - since patient's lactic acid is elevated I have held patient's metformin and will place patient on sliding scale coverage. 4. Hypertension - on lisinopril.   DVT Prophylaxis Lovenox.  Code Status: DO NOT RESUSCITATE.  Family Communication: Discussed with patient.  Disposition Plan: Admit to inpatient.    Harris Penton N. [facility] Pager [phone].  If 7PM-7AM, please contact night-coverage [email or web address] Password [email or web address] 09/09/2015, 12:00 AM

## 2015-09-09 NOTE — Progress Notes (Addendum)
  PROGRESS NOTE  Catherine Ellis [id number] DOB: December 28, 1921 DOA: 09/08/2015 PCP: Delman Cheadle, MD  Summary: 82 year old woman recently diagnosed with UTI and started on ciprofloxacin presented with fever, weakness, productive cough. Chest x-ray no acute infiltrate. Lactic acid was modestly elevated. Admitted for pneumonia and possible sepsis.  Assessment/Plan: 1. Pneumonia with possible sepsis on admission. Chest x-ray showed no definite infiltrate but history/cough suggested developing pneumonia. Influenza PCR negative. Lactic acid elevated, now within normal limits. No leukocytosis. No hypoxia. 2. Hyponatremia. Thought secondary to dehydration. Resolved. 3. Generalized weakness. 4. Diagnosed with UTI and started on Cipro 2/27 5. Diabetes mellitus   Improving. Afebrile. No hypoxia. Continue empiric antibiotics. Likely change to oral antibiotics and discharge within the next 48 hours.  Code Status: DNR DVT prophylaxis: enoxaparin Family Communication: none  Disposition Plan: return to [facility]  Murray Hodgkins, MD  [facility]  Pager [phone] If 7PM-7AM, please contact night-coverage at [email or web address], password [facility] 09/09/2015, 10:08 AM  LOS: 1 day   Consultants:    Procedures:    Antibiotics:  Azithromycin 2/28 >>  Ceftriaxone 2/28 >>  HPI/Subjective: Feeling considerably better today. Breathing fine. Coughing. Eating okay.  Objective: Filed Vitals:   09/08/15 2319 09/09/15 0000 09/09/15 0534 09/09/15 0928  BP:   129/47 128/48  Pulse:   72 84  Temp: 98 F (36.7 C)  99.4 F (37.4 C) 98.4 F (36.9 C)  TempSrc: Oral  Oral Oral  Resp:    24  Height:  5' 2.5" (1.588 m)    Weight:  77.111 kg (170 lb)    SpO2: 97%  93% 93%    Intake/Output Summary (Last 24 hours) at 09/09/15 1008 Last data filed at 09/09/15 1007  Gross per 24 hour  Intake 2900.02 ml  Output   1725 ml  Net 1175.02 ml     Filed Weights   09/09/15 0000  Weight: 77.111 kg (170 lb)      Exam:    General:  Appears calm and comfortable Cardiovascular: RRR, no m/r/g. No LE edema. Respiratory: CTA bilaterally, no w/r/r. Normal respiratory effort. Abdomen: soft, ntnd, soft umbilical hernia, easily reducible Skin: Appears unremarkable Musculoskeletal: grossly normal tone BUE/BLE Psychiatric: grossly normal mood and affect, speech fluent and appropriate Neurologic: grossly non-focal.  New data reviewed:  Complete metabolic panel unremarkable  Lactic acid normal  CBC unremarkable   Scheduled Meds: . azithromycin  500 mg Intravenous Q24H  . cefTRIAXone (ROCEPHIN)  IV  1 g Intravenous Q24H  . enoxaparin (LOVENOX) injection  40 mg Subcutaneous Q24H  . lisinopril  10 mg Oral Daily  . omega-3 acid ethyl esters  1 g Oral Daily   Continuous Infusions: . sodium chloride 125 mL/hr at 09/09/15 0320    Principal Problem:   CAP (community acquired pneumonia) Active Problems:   Hypertension   Hyponatremia   Weakness   Diabetes mellitus type 2, controlled ([place])   Weakness generalized   Time spent 20 minutes

## 2015-09-09 NOTE — Progress Notes (Signed)
CSW was contacted by [facility] and they would like to know if they Pt could be evaluated by PT/OT.   Pt lives in [facility] and the facility would need to know if Pt needs to be moved to a SNF placement briefly.      CSW will continue to follow Pt for d/c planning.    Pete Pelt [facility]  [phone]

## 2015-09-10 DIAGNOSIS — E119 Type 2 diabetes mellitus without complications: Secondary | ICD-10-CM

## 2015-09-10 DIAGNOSIS — E871 Hypo-osmolality and hyponatremia: Secondary | ICD-10-CM | POA: Diagnosis not present

## 2015-09-10 DIAGNOSIS — R531 Weakness: Secondary | ICD-10-CM | POA: Diagnosis not present

## 2015-09-10 DIAGNOSIS — E860 Dehydration: Secondary | ICD-10-CM | POA: Diagnosis not present

## 2015-09-10 DIAGNOSIS — J189 Pneumonia, unspecified organism: Secondary | ICD-10-CM | POA: Diagnosis not present

## 2015-09-10 DIAGNOSIS — N390 Urinary tract infection, site not specified: Secondary | ICD-10-CM | POA: Diagnosis not present

## 2015-09-10 DIAGNOSIS — A419 Sepsis, unspecified organism: Secondary | ICD-10-CM | POA: Diagnosis not present

## 2015-09-10 LAB — URINE CULTURE: Culture: NO GROWTH

## 2015-09-10 LAB — LEGIONELLA ANTIGEN, URINE

## 2015-09-10 MED ORDER — CEFUROXIME AXETIL 500 MG PO TABS: 500.0000 mg | ORAL_TABLET | Freq: Two times a day (BID) | ORAL | Status: DC

## 2015-09-10 MED ORDER — AZITHROMYCIN 500 MG PO TABS: 500.0000 mg | ORAL_TABLET | ORAL | Status: DC

## 2015-09-10 MED ORDER — CEFUROXIME AXETIL 500 MG PO TABS
500.0000 mg | ORAL_TABLET | Freq: Two times a day (BID) | ORAL | Status: DC
  Filled 2015-09-10 (×2): qty 1

## 2015-09-10 MED ORDER — AZITHROMYCIN 500 MG PO TABS
500.0000 mg | ORAL_TABLET | ORAL | Status: DC
  Filled 2015-09-10: qty 1

## 2015-09-10 NOTE — NC FL2 (Signed)
[place] MEDICAID FL2 LEVEL OF CARE SCREENING TOOL     IDENTIFICATION  Patient Name: TAQUISHA LASSETTER Birthdate: May 05, 1922 Sex: female Admission Date (Current Location): 09/08/2015  [facility] and [place] Number:  Herbalist and Address:  [facility],  [address], [place]      Provider Number: [id number]  Attending Physician Name and Address:  Samuella Cota, MD  Relative Name and Phone Number:       Current Level of Care: Hospital Recommended Level of Care: [place] Prior Approval Number:    Date Approved/Denied:   PASRR Number:  ([id number])  Discharge Plan: Other (Comment) (alf)    Current Diagnoses: Patient Active Problem List   Diagnosis Date Noted  . CAP (community acquired pneumonia) 09/09/2015  . Fever 09/08/2015  . Weakness 09/08/2015  . Diabetes mellitus type 2, controlled ([place]) 09/08/2015  . Weakness generalized 09/08/2015  . Atrophic vaginitis 06/16/2014  . Chronic maxillary sinusitis 06/06/2014  . Microhematuria 06/05/2014  . Unspecified vitamin D deficiency 08/10/2013  . Vitamin B12 deficiency 06/14/2013  . Seasonal affective disorder ([place]) 06/14/2013  . Fatigue 06/14/2013  . Hypertension 02/22/2013  . Other and unspecified hyperlipidemia 02/22/2013  . Hyponatremia 02/22/2013  . Memory loss 02/22/2013  . Diabetes mellitus ([place]) 04/13/2012  . At risk for polypharmacy 04/13/2012  . Incontinence of urine 04/13/2012  . Anal irritation 04/13/2012    Orientation RESPIRATION BLADDER Height & Weight     Self, Time, Situation, Place  Normal Continent Weight: 170 lb (77.111 kg) Height:  5' 2.5" (158.8 cm)  BEHAVIORAL SYMPTOMS/MOOD NEUROLOGICAL BOWEL NUTRITION STATUS      Continent Diet (heart healthy)  AMBULATORY STATUS COMMUNICATION OF NEEDS Skin   Limited Assist Verbally Normal                       Personal Care Assistance Level of Assistance  Bathing, Dressing, Feeding Bathing  Assistance: Limited assistance Feeding assistance: Independent Dressing Assistance: Limited assistance     Functional Limitations Info             SPECIAL CARE FACTORS FREQUENCY                       Contractures Contractures Info: Not present    Additional Factors Info  Code Status, Allergies Code Status Info:  (DNR) Allergies Info: NKDA           Current Medications (09/10/2015):  This is the current hospital active medication list Current Facility-Administered Medications  Medication Dose Route Frequency Provider Last Rate Last Dose  . acetaminophen (TYLENOL) tablet 650 mg  650 mg Oral Q6H PRN Rise Patience, MD   650 mg at 09/10/15 0944   Or  . acetaminophen (TYLENOL) suppository 650 mg  650 mg Rectal Q6H PRN Rise Patience, MD      . azithromycin [facility]) tablet 500 mg  500 mg Oral Q24H Donald Prose Runyon, RPH      . benzonatate (TESSALON) capsule 200 mg  200 mg Oral TID PRN Gardiner Barefoot, NP   200 mg at 09/10/15 0944  . cefTRIAXone (ROCEPHIN) 1 g in dextrose 5 % 50 mL IVPB  1 g Intravenous Q24H Rise Patience, MD   1 g at 09/09/15 2356  . enoxaparin (LOVENOX) injection 40 mg  40 mg Subcutaneous Q24H Rise Patience, MD   40 mg at 09/10/15 0945  . guaiFENesin-dextromethorphan (ROBITUSSIN DM) 100-10 MG/5ML syrup  5 mL  5 mL Oral Q4H PRN Gardiner Barefoot, NP   5 mL at 09/09/15 2056  . lisinopril (PRINIVIL,ZESTRIL) tablet 10 mg  10 mg Oral Daily Rise Patience, MD   10 mg at 09/10/15 0945  . omega-3 acid ethyl esters (LOVAZA) capsule 1 g  1 g Oral Daily Rise Patience, MD   1 g at 09/10/15 0945  . ondansetron (ZOFRAN) tablet 4 mg  4 mg Oral Q6H PRN Rise Patience, MD       Or  . ondansetron [facility]) injection 4 mg  4 mg Intravenous Q6H PRN Rise Patience, MD         Discharge Medications: Please see discharge summary for a list of discharge medications.  Relevant Imaging Results:  Relevant Lab  Results:   Additional Information SSN:    SSN-[id number]  Carlean Jews, LCSW

## 2015-09-10 NOTE — Clinical Social Work Note (Signed)
Clinical Social Work Assessment  Patient Details  Name: Catherine Ellis MRN: [id number] Date of Birth: 1921-09-17  Date of referral:  09/10/15               Reason for consult:  Facility Placement                Permission sought to share information with:  Facility Sport and exercise psychologist, Family Supports Permission granted to share information::  Yes, Verbal Permission Granted  Name::        Agency::     Relationship::     Contact Information:     Housing/Transportation Living arrangements for the past 2 months:  [place] of Information:  Patient Patient Interpreter Needed:  None Criminal Activity/Legal Involvement Pertinent to Current Situation/Hospitalization:    Significant Relationships:  Other Family Members (niece wanda eller [phone]) Lives with:  Self Do you feel safe going back to the place where you live?    Need for family participation in patient care:  Yes (Comment)  Care giving concerns:  No caregiver   Social Worker assessment / plan:  CSW met with pt at bedside to discuss discharge needs.  CSW explained [facility] [address].  CSW reported to pt that she was sending pt paperwork over to the ALF and would be calling her niece to coordinate discharge.  CSW spoke with RN who stated that pt would be ready for discharge by 4:30 today.  This information was provided to pt's niece who stated that she would be at hospital by this time to transport pt to [place]   Employment status:  Retired Forensic scientist:  Medicare PT Recommendations:  No Follow Up Information / Referral to community resources:     Patient/Family's Response to care:  [facility].  Pt reported that her niece was bringing her clothes to take to the [facility] today.    Patient/Family's Understanding of and Emotional Response to Diagnosis, Current Treatment, and Prognosis:  Pt understands that she  will be doing some PT at the [facility]F part [facility] before going back to her independent apartment.   Emotional Assessment Appearance:  Appears stated age Attitude/Demeanor/Rapport:  Lethargic Affect (typically observed):  Accepting Orientation:  Oriented to Self, Oriented to Place, Oriented to  Time, Oriented to Situation Alcohol / Substance use:    Psych involvement (Current and /or in the community):     Discharge Needs  Concerns to be addressed:    Readmission within the last 30 days:    Current discharge risk:    Barriers to Discharge:  No Barriers Identified   Carlean Jews, LCSW 09/10/2015, 3:05 PM

## 2015-09-10 NOTE — Evaluation (Addendum)
Physical Therapy Evaluation Patient Details Name: ANJELLICA GREENLAND MRN: [id number] DOB: 26-Sep-1921 Today's Date: 09/10/2015   History of Present Illness  82 y.o. female with h/o DM, recent UTI admitted from [place] with CAP, hyponatremia.   Clinical Impression  Pt ambulated 300' with RW without loss of balance, SaO2 96% on RA, HR 86. She is independent with mobility using a walker. She is ready to DC to ILF from PT standpoint. No further PT indicated, PT signing off.     Follow Up Recommendations No PT follow up    Equipment Recommendations  None recommended by PT    Recommendations for Other Services       Precautions / Restrictions Precautions Precautions: None Precaution Comments: pt reports no falls in past year Restrictions Weight Bearing Restrictions: No      Mobility  Bed Mobility Overal bed mobility: Independent                Transfers Overall transfer level: Independent                  Ambulation/Gait Ambulation/Gait assistance: Modified independent (Device/Increase time) Ambulation Distance (Feet): 300 Feet Assistive device: Rolling walker (2 wheeled) Gait Pattern/deviations: WFL(Within Functional Limits)   Gait velocity interpretation: at or above normal speed for age/gender General Gait Details: steady with RW, pt walks without AD at baseline but wanted to use RW bc she feels tired  Financial trader [place] (Stroke Patients Only)       Balance Overall balance assessment: Modified Independent                                           Pertinent Vitals/Pain Pain Assessment: No/denies pain    Home Living Family/patient expects to be discharged to:: Private residence     Type of Home: Independent living facility       Home Layout: One level Home Equipment: Environmental consultant - 2 wheels;Cane - single point      Prior Function Level of Independence: Independent         Comments:  uses cane when knee is bothering her, hasn't used it recently; goes to water aerobics 3x/week, independent with ADLs and with walking to dining room at ALF     Hand Dominance        Extremity/Trunk Assessment   Upper Extremity Assessment: Overall WFL for tasks assessed           Lower Extremity Assessment: Overall WFL for tasks assessed      Cervical / Trunk Assessment: Normal  Communication   Communication: No difficulties  Cognition Arousal/Alertness: Awake/alert Behavior During Therapy: WFL for tasks assessed/performed Overall Cognitive Status: Within Functional Limits for tasks assessed                      General Comments      Exercises        Assessment/Plan    PT Assessment Patent does not need any further PT services  PT Diagnosis     PT Problem List    PT Treatment Interventions     PT Goals (Current goals can be found in the Care Plan section) Acute Rehab PT Goals Patient Stated Goal: return to water aerobics PT Goal Formulation: All assessment and education complete, DC therapy  Frequency     Barriers to discharge        Co-evaluation               End of Session Equipment Utilized During Treatment: Gait belt Activity Tolerance: Patient tolerated treatment well Patient left: in chair;with call bell/phone within reach Nurse Communication: Mobility status         Time: [id number] PT Time Calculation (min) (ACUTE ONLY): 17 min   Charges:   PT Evaluation $PT Eval Low Complexity: 1 Procedure     PT G Codes:  PT G-Codes **NOT FOR INPATIENT CLASS** Functional Assessment Tool Used clinical judgement clinical judgement at 1056 on 09/10/15 by Lucile Crater, PT Functional Limitation Mobility: Walking and moving around Mobility: Walking and moving around at 1056 on 09/10/15 by Lucile Crater, PT Mobility: Walking and Moving Around Current Status [phone]) 0 percent impaired, limited or restricted [place] at 1056 on  09/10/15 by Lucile Crater, PT Mobility: Walking and Moving Around Goal Status [phone]) 0 percent impaired, limited or restricted [place] at 1056 on 09/10/15 by Lucile Crater, PT Mobility: Walking and Moving Around Discharge Status [phone]) 0 percent impaired, limited or restricted       Philomena Doheny 09/10/2015, 10:57 AM [phone]

## 2015-09-10 NOTE — Clinical Social Work Note (Signed)
CSW spoke with Erasmo Downer at [facility] who stated that pt has a bed at their assisted living when ready for discharge.  [facility] requested FL2.  CSW waiting on PT evaluation which is scheduled for today so FL2 can be updated.  Dede Query, LCSW [place] Worker - Weekend Coverage cell #: [phone]

## 2015-09-10 NOTE — Clinical Social Work Note (Signed)
CSW spoke with [facility] Erasmo Downer who stated that pt will be going into one of their assisted living beds.  CSW completed FL2 and will follow up with pt until discharge.  Dede Query, LCSW [place] Worker - Weekend Coverage cell #: [phone]

## 2015-09-10 NOTE — Progress Notes (Signed)
  PROGRESS NOTE  Catherine Ellis [id number] DOB: 03-Feb-1922 DOA: 09/08/2015 PCP: Delman Cheadle, MD  Summary: 82 year old woman recently diagnosed with UTI and started on ciprofloxacin presented with fever, weakness, productive cough. Chest x-ray no acute infiltrate. Lactic acid was modestly elevated. Admitted for pneumonia and possible sepsis.  Assessment/Plan: 1. Pneumonia with possible sepsis on admission. Appears much improved. Chest x-ray showed no definite infiltrate but history/cough suggested developing pneumonia. Influenza PCR negative. Lactic acid minimally elevated, quickly returned to normal. No leukocytosis. No hypoxia. 2. Hyponatremia. Thought secondary to dehydration. Resolved. 3. Generalized weakness. Did well with physical therapy. 4. Diagnosed with UTI and started on Cipro 2/27. Repeat culture negative. 5. Diabetes mellitus type 2, stable 6. Obesity    Doing well. Return to [facility]. ALF.  Complete oral abx.  Code Status: DNR DVT prophylaxis: enoxaparin Family Communication: none  Disposition Plan: return to [facility]  Murray Hodgkins, MD  [facility]  Pager [phone] If 7PM-7AM, please contact night-coverage at [email or web address], password [email or web address] 09/10/2015, 2:05 PM  LOS: 2 days   Consultants:  PT: no followup  OT: no needs  Procedures:    Antibiotics:  Azithromycin 2/28 >> 3/4  Ceftriaxone 2/28 >> 3/6  HPI/Subjective: Feeling better. Breathing ok. Eating ok.  Objective: Filed Vitals:   09/10/15 0455 09/10/15 0828 09/10/15 1053 09/10/15 1356  BP: 111/62 129/49  141/60  Pulse: 70 72  69  Temp: 99 F (37.2 C) 98.4 F (36.9 C)  98.1 F (36.7 C)  TempSrc: Oral Oral  Oral  Resp: 21 20  20   Height:      Weight:      SpO2: 94% 96% 96% 94%    Intake/Output Summary (Last 24 hours) at 09/10/15 1405 Last data filed at 09/09/15 2356  Gross per 24 hour  Intake    300 ml  Output    150 ml  Net    150 ml     Filed Weights   09/09/15  0000  Weight: 77.111 kg (170 lb)    Exam:    General:  Appears comfortable, calm. Cardiovascular: Regular rate and rhythm, no murmur, rub or gallop. No lower extremity edema. Respiratory: Clear to auscultation bilaterally, no wheezes, rales or rhonchi. Normal respiratory effort. Psychiatric: grossly normal mood and affect, speech fluent and appropriate  Scheduled Meds: . azithromycin  500 mg Oral Q24H  . cefTRIAXone (ROCEPHIN)  IV  1 g Intravenous Q24H  . enoxaparin (LOVENOX) injection  40 mg Subcutaneous Q24H  . lisinopril  10 mg Oral Daily  . omega-3 acid ethyl esters  1 g Oral Daily   Continuous Infusions:    Principal Problem:   CAP (community acquired pneumonia) Active Problems:   Hypertension   Hyponatremia   Weakness   Diabetes mellitus type 2, controlled (HCC)   Weakness generalized

## 2015-09-10 NOTE — Progress Notes (Signed)
OT Cancellation Note  Patient Details Name: [name] MRN: [id number] DOB: 02-Mar-1922   Cancelled Treatment:    Reason Eval/Treat Not Completed: OT screened, no needs identified, will sign off  [place], Thereasa Parkin 09/10/2015, 11:09 AM

## 2015-09-10 NOTE — Discharge Summary (Signed)
Physician Discharge Summary  Catherine Ellis [id number] DOB: 10-Oct-1921 DOA: 09/08/2015  PCP: Delman Cheadle, MD  Admit date: 09/08/2015 Discharge date: 09/10/2015  Recommendations for Outpatient Follow-up:  1. Follow-up resolution of pneumonia     Follow-up Information    Follow up with [facility], MD. Schedule an appointment as soon as possible for a visit in 1 week.   Specialty:  Family Medicine   Contact information:   [place] [address] [phone]      Discharge Diagnoses:  1. CAP with possible sepsis on admission 2. Hyponatremia 3. Generalized weakness 4. DM type 2  Discharge Condition: improved [facility]position: [facility]  Diet recommendation: diabetic diet  Filed Weights   09/09/15 0000  Weight: 77.111 kg (170 lb)    History of present illness:  82 year old woman recently diagnosed with UTI and started on ciprofloxacin presented with fever, weakness, productive cough. Chest x-ray no acute infiltrate. Lactic acid was modestly elevated. Admitted for pneumonia and possible sepsis.  Hospital Course:  Treated with empiric abx with rapid clinical improvement. No hypoxia. Hospitalization was uncomplicated. Individual issues as below.   Pneumonia with possible sepsis on admission. Appears much improved. Chest x-ray showed no definite infiltrate but history/cough suggested developing pneumonia. Influenza PCR negative. Lactic acid minimally elevated, quickly returned to normal. No leukocytosis. No hypoxia.  Hyponatremia. Thought secondary to dehydration. Resolved.  Generalized weakness. Did well with physical therapy.  Diagnosed with UTI and started on Cipro 2/27. Repeat culture negative.  Diabetes mellitus type 2, stable  Obesity   Complete oral abx.  Consultants:  PT: no followup  OT: no needs  Procedures:  Non e  Antibiotics:  Azithromycin 2/28 >> 3/4  Ceftriaxone 2/28 >> 3/6  Discharge Instructions  Discharge  Instructions    Diet Carb Modified    Complete by:  As directed      Discharge instructions    Complete by:  As directed   Call your physician or seek immediate medical attention for fever, shortness of breath, wheezing, worsening cough or worsening of condition.     Increase activity slowly    Complete by:  As directed           Current Discharge Medication List    START taking these medications   Details  azithromycin (ZITHROMAX) 500 MG tablet Take 1 tablet (500 mg total) by mouth daily. Take in evening. Start 3/2. Qty: 3 tablet, Refills: 0    cefUROXime (CEFTIN) 500 MG tablet Take 1 tablet (500 mg total) by mouth 2 (two) times daily with a meal. Qty: 10 tablet, Refills: 0      CONTINUE these medications which have NOT CHANGED   Details  Cholecalciferol (VITAMIN D PO) Take 5,000 Units by mouth daily.    fish oil-omega-3 fatty acids 1000 MG capsule Take 2 g by mouth daily.    GLUCOSAMINE-CHONDROITIN PO Take 1 tablet by mouth daily.     lisinopril (PRINIVIL,ZESTRIL) 10 MG tablet Take 1 tablet (10 mg total) by mouth daily. Qty: 30 tablet, Refills: 3.   Associated Diagnoses: Essential hypertension    metFORMIN (GLUCOPHAGE) 500 MG tablet TAKE 1 TABLET TWICE DAILY WITH FOOD. Qty: 60 tablet, Refills: 11   Associated Diagnoses: Diabetes mellitus type 2, controlled ([place])    OVER THE COUNTER MEDICATION OTC Aquaph Healing Ointment for itching    benzonatate (TESSALON) 100 MG capsule Take 2 capsules (200 mg total) by mouth 2 (two) times daily as needed for cough. Qty: 30 capsule, Refills: 0  blood glucose meter kit and supplies KIT Pt would prefer one touch ultra.  History of controlled DM, checks glucose once daily. Deliver please Qty: 1 each, Refills: 0   Associated Diagnoses: Diabetes mellitus type 2, controlled (HCC)    fluticasone (FLONASE) 50 MCG/ACT nasal spray Place 2 sprays into both nostrils at bedtime. Qty: 16 g, Refills: 2    glucose blood test strip Test blood  sugar daily. Dx code: E11.8 Qty: 100 each, Refills: 3    meloxicam (MOBIC) 7.5 MG tablet Take 1 tablet (7.5 mg total) by mouth daily. Qty: 30 tablet, Refills: 0      STOP taking these medications     cephALEXin (KEFLEX) 500 MG capsule      valACYclovir (VALTREX) 1000 MG tablet        No Known Allergies  The results of significant diagnostics from this hospitalization (including imaging, microbiology, ancillary and laboratory) are listed below for reference.    Significant Diagnostic Studies: Dg Chest 2 View  09/08/2015  CLINICAL DATA:  82 year old female with fever and sepsis EXAM: CHEST  2 VIEW COMPARISON:  Radiograph dated 09/07/2015 FINDINGS: Two views of the chest demonstrate mild blunting of the right costophrenic angle, which may be related to a small pleural effusion. There is no focal consolidation. No pneumothorax. The cardiac silhouette is within normal limits. There is degenerative changes of the osseous structures. IMPRESSION: No focal consolidation.  Probable small right pleural effusion. Electronically Signed   By: Anner Crete M.D.   On: 09/08/2015 19:57   Dg Chest 2 View  09/07/2015  CLINICAL DATA:  Cough and fever.  No time course given. EXAM: CHEST  2 VIEW COMPARISON:  03/24/2014 FINDINGS: The cardiac silhouette, mediastinal and hilar contours are within normal limits and stable. There is tortuosity and calcification of the thoracic aorta. Stable mild emphysematous changes and bibasilar scarring. No infiltrates or effusions. The bony thorax is intact. IMPRESSION: Chronic lung changes but no acute pulmonary findings. Electronically Signed   By: Marijo Sanes M.D.   On: 09/07/2015 15:56    Microbiology: Recent Results (from the past 240 hour(s))  Urine culture     Status: None   Collection Time: 09/07/15  3:36 PM  Result Value Ref Range Status   Colony Count 5,000 COLONIES/ML  Final   Organism ID, Bacteria Insignificant Growth  Final  Urine culture     Status:  None   Collection Time: 09/08/15  7:31 PM  Result Value Ref Range Status   Specimen Description URINE, CATHETERIZED  Final   Special Requests NONE  Final   Culture   Final    NO GROWTH 2 DAYS Performed at [facility]    Report Status 09/10/2015 FINAL  Final  Culture, blood (routine x 2)     Status: None (Preliminary result)   Collection Time: 09/08/15  7:34 PM  Result Value Ref Range Status   Specimen Description BLOOD RIGHT ANTECUBITAL  Final   Special Requests BOTTLES DRAWN AEROBIC AND ANAEROBIC 5CC  Final   Culture   Final    NO GROWTH 2 DAYS Performed at [facility]    Report Status PENDING  Incomplete  Culture, blood (routine x 2)     Status: None (Preliminary result)   Collection Time: 09/08/15  7:34 PM  Result Value Ref Range Status   Specimen Description BLOOD LEFT ARM  Final   Special Requests BOTTLES DRAWN AEROBIC AND ANAEROBIC 5CC  Final   Culture   Final  NO GROWTH 2 DAYS Performed at [facility]    Report Status PENDING  Incomplete  MRSA PCR Screening     Status: None   Collection Time: 09/09/15 12:33 AM  Result Value Ref Range Status   MRSA by PCR NEGATIVE NEGATIVE Final    Comment:        The GeneXpert MRSA Assay (FDA approved for NASAL specimens only), is one component of a comprehensive MRSA colonization surveillance program. It is not intended to diagnose MRSA infection nor to guide or monitor treatment for MRSA infections.      Labs: Basic Metabolic Panel:  Recent Labs Lab 09/08/15 1934 09/09/15 0109 09/09/15 0505  NA 131*  --  135  K 3.7  --  3.5  CL 99*  --  106  CO2 20*  --  21*  GLUCOSE 223*  --  128*  BUN 19  --  14  CREATININE 0.92 0.74 0.77  CALCIUM 8.9  --  7.7*   Liver Function Tests:  Recent Labs Lab 09/08/15 1934 09/09/15 0505  AST 21 19  ALT 15 12*  ALKPHOS 47 38  BILITOT 1.1 0.7  PROT 7.4 6.2*  ALBUMIN 3.9 3.2*   CBC:  Recent Labs Lab 09/08/15 1934 09/09/15 0109 09/09/15 0505    WBC 10.3 9.6 8.5  NEUTROABS 8.7*  --  5.5  HGB 13.6 12.4 12.0  HCT 39.5 36.6 35.6*  MCV 91.4 91.5 92.7  PLT 269 261 241   Cardiac Enzymes:  Recent Labs Lab 09/09/15 0109  TROPONINI <0.03    Principal Problem:   CAP (community acquired pneumonia) Active Problems:   Hypertension   Hyponatremia   Weakness   Diabetes mellitus type 2, controlled ([place])   Weakness generalized   Time coordinating discharge: 25 minutes  Signed:  Murray Hodgkins, MD [facility] 09/10/2015, 2:29 PM

## 2015-09-10 NOTE — Progress Notes (Signed)
Initial Nutrition Assessment  DOCUMENTATION CODES:   Obesity unspecified  INTERVENTION:  - RD will monitor for needs at follow-up  NUTRITION DIAGNOSIS:   Inadequate oral intake related to acute illness, nausea as evidenced by per patient/family report.  GOAL:   Patient will meet greater than or equal to 90% of their needs  MONITOR:   PO intake, Weight trends, Labs, I & O's  REASON FOR ASSESSMENT:   Consult Assessment of nutrition requirement/status  ASSESSMENT:   82 y.o. female with history of hypertension and diabetes mellitus type 2 presented to the ER because of persistent weakness. Patient has been having productive cough for last 3-4 days and had gone to urgent care center 2 days ago and was treated for UTI. At that time influenza PCR was negative. Since patient is still having weakness and productive cough patient was referred to the ER from her living facility. Chest x-ray was not showing any definite infiltrates. Patient to week and lactic acid was mildly elevated. Patient on exam has been having productive cough. Patient will be admitted for weakness with possible SIRS from pneumonia. Patient denies any chest pain loss of consciousness nausea vomiting or diarrhea.   Pt seen for consult. BMI indicates obesity. Per chart review, pt ate 90% of breakfast and 50% of lunch yesterday (3/1). Pt reports that for lunch she ate part of a chef salad. She states she usually has a very good appetite but that for 1-2 days PTA and now her appetite is decreased due to drowsiness and nausea. Pt denies PO intakes exacerbating feelings of nausea and she denies abdominal pain.   During discussion it appeared that pt's dentures were slightly ill-fitting. She denies any chewing difficulties and denies any swallowing problems. She states she does sometimes have shakiness and spill items on her shirt.   Physical assessment shows no muscle or fat wasting. Per chart review, pt's weight has been  stable (166-170 lbs) for the past 2.5 years. Pt meeting needs PTA; will monitor for intakes with nausea during admission. Medications reviewed. Labs reviewed; Ca: 7.7 mg/dL, no CBGs recorded/taken despite hx of Type 2 DM.   Diet Order:  Diet heart healthy/carb modified Room service appropriate?: Yes; Fluid consistency:: Thin  Skin:  Reviewed, no issues  Last BM:  3/2  Height:   Ht Readings from Last 1 Encounters:  09/09/15 5' 2.5" (1.588 m)    Weight:   Wt Readings from Last 1 Encounters:  09/09/15 170 lb (77.111 kg)    Ideal Body Weight:  51.14 kg (kg)  BMI:  Body mass index is 30.58 kg/(m^2).  Estimated Nutritional Needs:   Kcal:  1200-1400  Protein:  55-65 grams  Fluid:  >/= 1.5 L/day  EDUCATION NEEDS:   No education needs identified at this time     Jarome Matin, RD, LDN Inpatient Clinical Dietitian Pager # [phone] After hours/weekend pager # [phone]

## 2015-09-10 NOTE — Progress Notes (Signed)
PHARMACIST - PHYSICIAN COMMUNICATION  CONCERNING: Antibiotic IV to Oral Route Change Policy  RECOMMENDATION: This patient is receiving azithromycin by the intravenous route.  Based on criteria approved by the Pharmacy and Therapeutics Committee, the antibiotic(s) is/are being converted to the equivalent oral dose form(s).   DESCRIPTION: These criteria include:  Patient being treated for a respiratory tract infection, urinary tract infection, cellulitis or clostridium difficile associated diarrhea if on metronidazole  The patient is not neutropenic and does not exhibit a GI malabsorption state  The patient is eating (either orally or via tube) and/or has been taking other orally administered medications for a least 24 hours  The patient is improving clinically and has a Tmax < 100.5  If you have questions about this conversion, please contact the Pharmacy Department  []  ( [phone] )  [place] []  ( [phone] )  [facility] []  ( [phone] )  [place] []  ( [phone] )  [facility] [x]  ( [phone] )  [facility]  

## 2015-09-10 NOTE — Progress Notes (Signed)
Gave report to Helene Kelp, Therapist, sports at [place]. Left number if she had additional questions.

## 2015-09-11 ENCOUNTER — Telehealth: Payer: Self-pay | Admitting: Family Medicine

## 2015-09-11 NOTE — Telephone Encounter (Signed)
Malachy Mood from [facility] stated Dr. Brigitte Pulse need to see Teressa Senter in one week for a Hospital Follow up. Dr. Brigitte Pulse schedule is booked. Please call Malachy Mood at [facility] to schedule the appointment for [place]. [phone].

## 2015-09-11 NOTE — Telephone Encounter (Signed)
She can come to the walk-in center, she can follow-up with Dr. Edilia Bo at the [facility] in [facility] (will just have to call on Mon for an appt w/ her - I think she sees pt's Mon, Tues, and Thurs and probably does have some openings since she's newly established) or ok to overbook her into my clinic on Thursday early 8 a.m. But let pt know that the latter option will just be a very quick visit to f/u from her hosp and make sure she is getting better.

## 2015-09-11 NOTE — Telephone Encounter (Signed)
Dr. Brigitte Pulse,  You have seen this patient in the past in the appointment center, but lately patient has been seeing Dr. Lorelei Pont.   Patient needs a hospital follow up next week and there are no available slots.  How would you like for me to proceed with this patient?  Please advise,   Staci

## 2015-09-13 LAB — CULTURE, BLOOD (ROUTINE X 2)
Culture: NO GROWTH
Culture: NO GROWTH

## 2015-09-16 NOTE — Telephone Encounter (Signed)
Talked to [facility] at [facility] to see if they patient wanted to see Dr. Lorelei Pont instead, she was going to talk to patient and let me know.  I have left a couple of message for her to call me back..   I did tell her Dr. Brigitte Pulse only had appt available on Thursday morning at 8 am, which would be a very quick appt for a Hospital Follow up.   I will call again tomorrow if she does not show up on Thursday 09/17/2015.

## 2015-09-17 NOTE — Telephone Encounter (Signed)
Pt did not show up

## 2015-10-05 DIAGNOSIS — Z0000 Encounter for general adult medical examination without abnormal findings: Secondary | ICD-10-CM | POA: Diagnosis not present

## 2015-10-05 DIAGNOSIS — Z8701 Personal history of pneumonia (recurrent): Secondary | ICD-10-CM | POA: Diagnosis not present

## 2015-10-05 DIAGNOSIS — I10 Essential (primary) hypertension: Secondary | ICD-10-CM | POA: Diagnosis not present

## 2015-10-05 DIAGNOSIS — E119 Type 2 diabetes mellitus without complications: Secondary | ICD-10-CM | POA: Diagnosis not present

## 2015-10-05 DIAGNOSIS — E559 Vitamin D deficiency, unspecified: Secondary | ICD-10-CM | POA: Diagnosis not present

## 2015-10-05 DIAGNOSIS — J189 Pneumonia, unspecified organism: Secondary | ICD-10-CM | POA: Diagnosis not present

## 2015-10-08 DIAGNOSIS — M25562 Pain in left knee: Secondary | ICD-10-CM | POA: Diagnosis not present

## 2015-10-26 ENCOUNTER — Telehealth: Payer: Self-pay | Admitting: Family Medicine

## 2015-10-26 NOTE — Telephone Encounter (Signed)
Staci contact pt who informed us that she has found a new PCP, did not share who or where. Glad to see pt back on a prn basis but am removing my name as her PCP as I have not seen pt in almost 2 years.

## 2015-11-05 ENCOUNTER — Other Ambulatory Visit: Payer: Self-pay | Admitting: Family Medicine

## 2016-01-04 ENCOUNTER — Other Ambulatory Visit: Payer: Self-pay

## 2016-01-04 ENCOUNTER — Other Ambulatory Visit: Payer: Self-pay | Admitting: Emergency Medicine

## 2016-01-04 ENCOUNTER — Other Ambulatory Visit: Payer: Self-pay | Admitting: Family Medicine

## 2016-01-04 DIAGNOSIS — I10 Essential (primary) hypertension: Secondary | ICD-10-CM

## 2016-01-04 MED ORDER — LISINOPRIL 10 MG PO TABS: 10.0000 mg | ORAL_TABLET | Freq: Every day | ORAL | Status: DC

## 2016-03-10 DIAGNOSIS — R509 Fever, unspecified: Secondary | ICD-10-CM | POA: Diagnosis not present

## 2016-03-31 DIAGNOSIS — E119 Type 2 diabetes mellitus without complications: Secondary | ICD-10-CM | POA: Diagnosis not present

## 2016-04-06 DIAGNOSIS — I10 Essential (primary) hypertension: Secondary | ICD-10-CM | POA: Diagnosis not present

## 2016-04-06 DIAGNOSIS — E119 Type 2 diabetes mellitus without complications: Secondary | ICD-10-CM | POA: Diagnosis not present

## 2016-04-06 DIAGNOSIS — Z23 Encounter for immunization: Secondary | ICD-10-CM | POA: Diagnosis not present

## 2016-05-02 DIAGNOSIS — H353122 Nonexudative age-related macular degeneration, left eye, intermediate dry stage: Secondary | ICD-10-CM | POA: Diagnosis not present

## 2016-05-02 DIAGNOSIS — H52203 Unspecified astigmatism, bilateral: Secondary | ICD-10-CM | POA: Diagnosis not present

## 2016-05-02 DIAGNOSIS — E119 Type 2 diabetes mellitus without complications: Secondary | ICD-10-CM | POA: Diagnosis not present

## 2016-05-02 DIAGNOSIS — H353112 Nonexudative age-related macular degeneration, right eye, intermediate dry stage: Secondary | ICD-10-CM | POA: Diagnosis not present

## 2016-05-18 DIAGNOSIS — L570 Actinic keratosis: Secondary | ICD-10-CM | POA: Diagnosis not present

## 2016-05-18 DIAGNOSIS — D1801 Hemangioma of skin and subcutaneous tissue: Secondary | ICD-10-CM | POA: Diagnosis not present

## 2016-05-18 DIAGNOSIS — L814 Other melanin hyperpigmentation: Secondary | ICD-10-CM | POA: Diagnosis not present

## 2016-05-26 DIAGNOSIS — M6281 Muscle weakness (generalized): Secondary | ICD-10-CM | POA: Diagnosis not present

## 2016-05-26 DIAGNOSIS — R32 Unspecified urinary incontinence: Secondary | ICD-10-CM | POA: Diagnosis not present

## 2016-05-30 DIAGNOSIS — M6281 Muscle weakness (generalized): Secondary | ICD-10-CM | POA: Diagnosis not present

## 2016-05-30 DIAGNOSIS — R32 Unspecified urinary incontinence: Secondary | ICD-10-CM | POA: Diagnosis not present

## 2016-06-22 DIAGNOSIS — L814 Other melanin hyperpigmentation: Secondary | ICD-10-CM | POA: Diagnosis not present

## 2016-06-22 DIAGNOSIS — L570 Actinic keratosis: Secondary | ICD-10-CM | POA: Diagnosis not present

## 2016-07-06 DIAGNOSIS — E119 Type 2 diabetes mellitus without complications: Secondary | ICD-10-CM | POA: Diagnosis not present

## 2016-07-18 DIAGNOSIS — E119 Type 2 diabetes mellitus without complications: Secondary | ICD-10-CM | POA: Diagnosis not present

## 2016-07-18 DIAGNOSIS — I10 Essential (primary) hypertension: Secondary | ICD-10-CM | POA: Diagnosis not present

## 2016-07-18 DIAGNOSIS — R351 Nocturia: Secondary | ICD-10-CM | POA: Diagnosis not present

## 2016-09-09 IMAGING — CR DG CHEST 2V
2 series · 2 of 2 positions shown · non-contrast
Comparison: 03/24/2014

CLINICAL DATA: Cough and fever.  No time course given.

EXAM:
CHEST  2 VIEW

[PA]
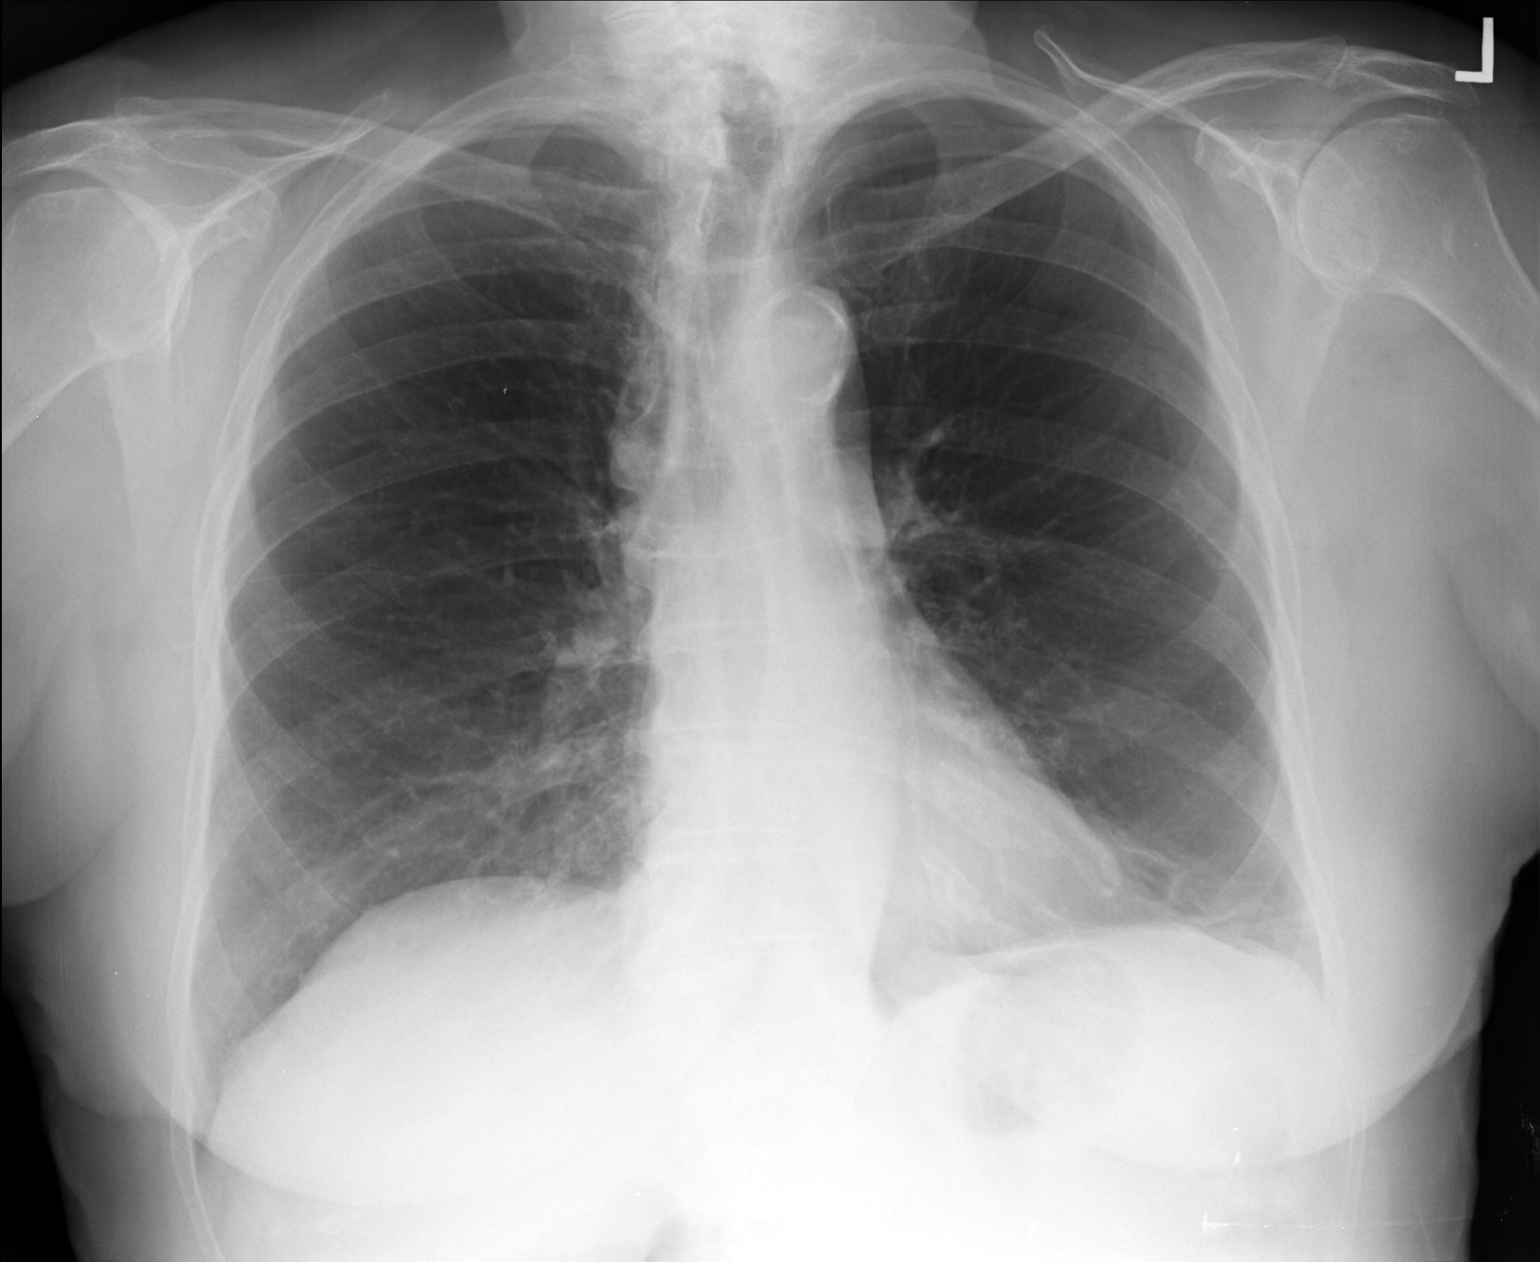

[lateral]
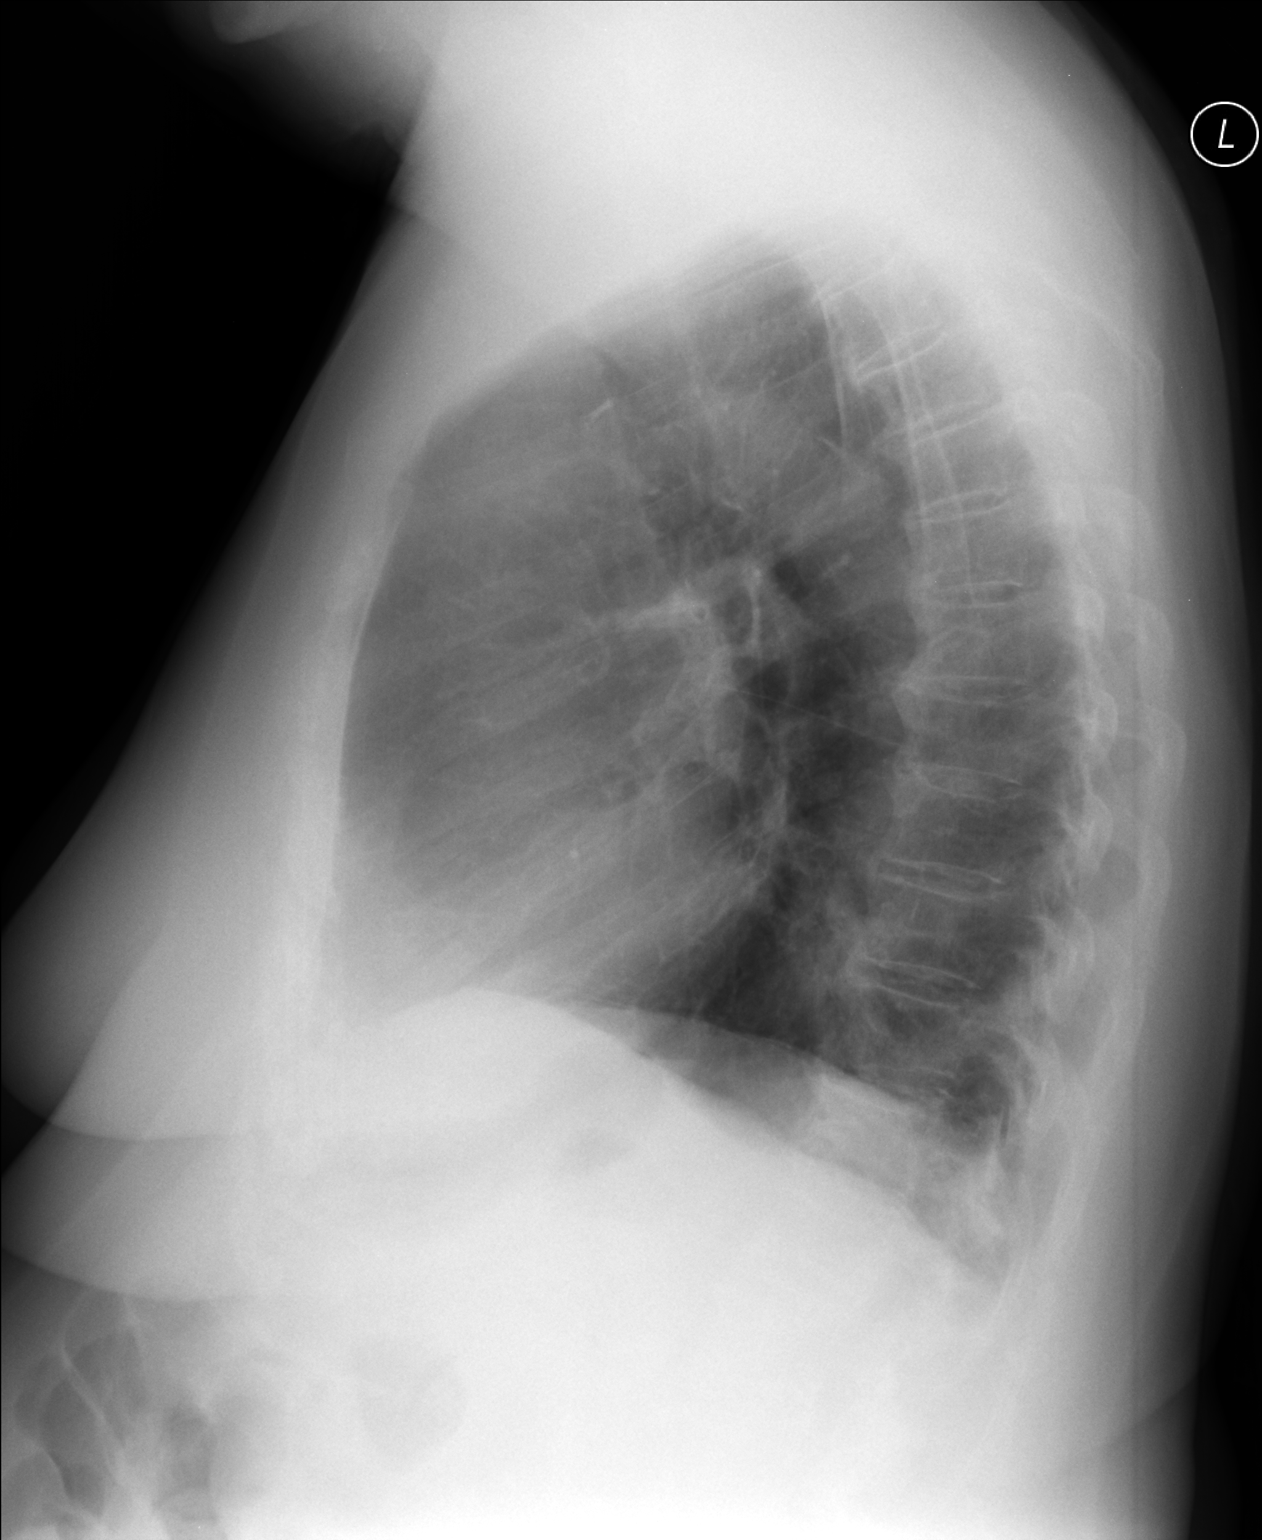

[2 of 2 positions shown; findings below may reference images not displayed]

FINDINGS: The cardiac silhouette, mediastinal and hilar contours are within
normal limits and stable. There is tortuosity and calcification of
the thoracic aorta. Stable mild emphysematous changes and bibasilar
scarring. No infiltrates or effusions. The bony thorax is intact.
IMPRESSION: Chronic lung changes but no acute pulmonary findings.

## 2016-09-10 IMAGING — CR DG CHEST 2V
2 series · 2 of 2 positions shown · non-contrast
Comparison: Radiograph dated 09/07/2015

CLINICAL DATA: [AGE] female with fever and sepsis

EXAM:
CHEST  2 VIEW

[x chest ap]
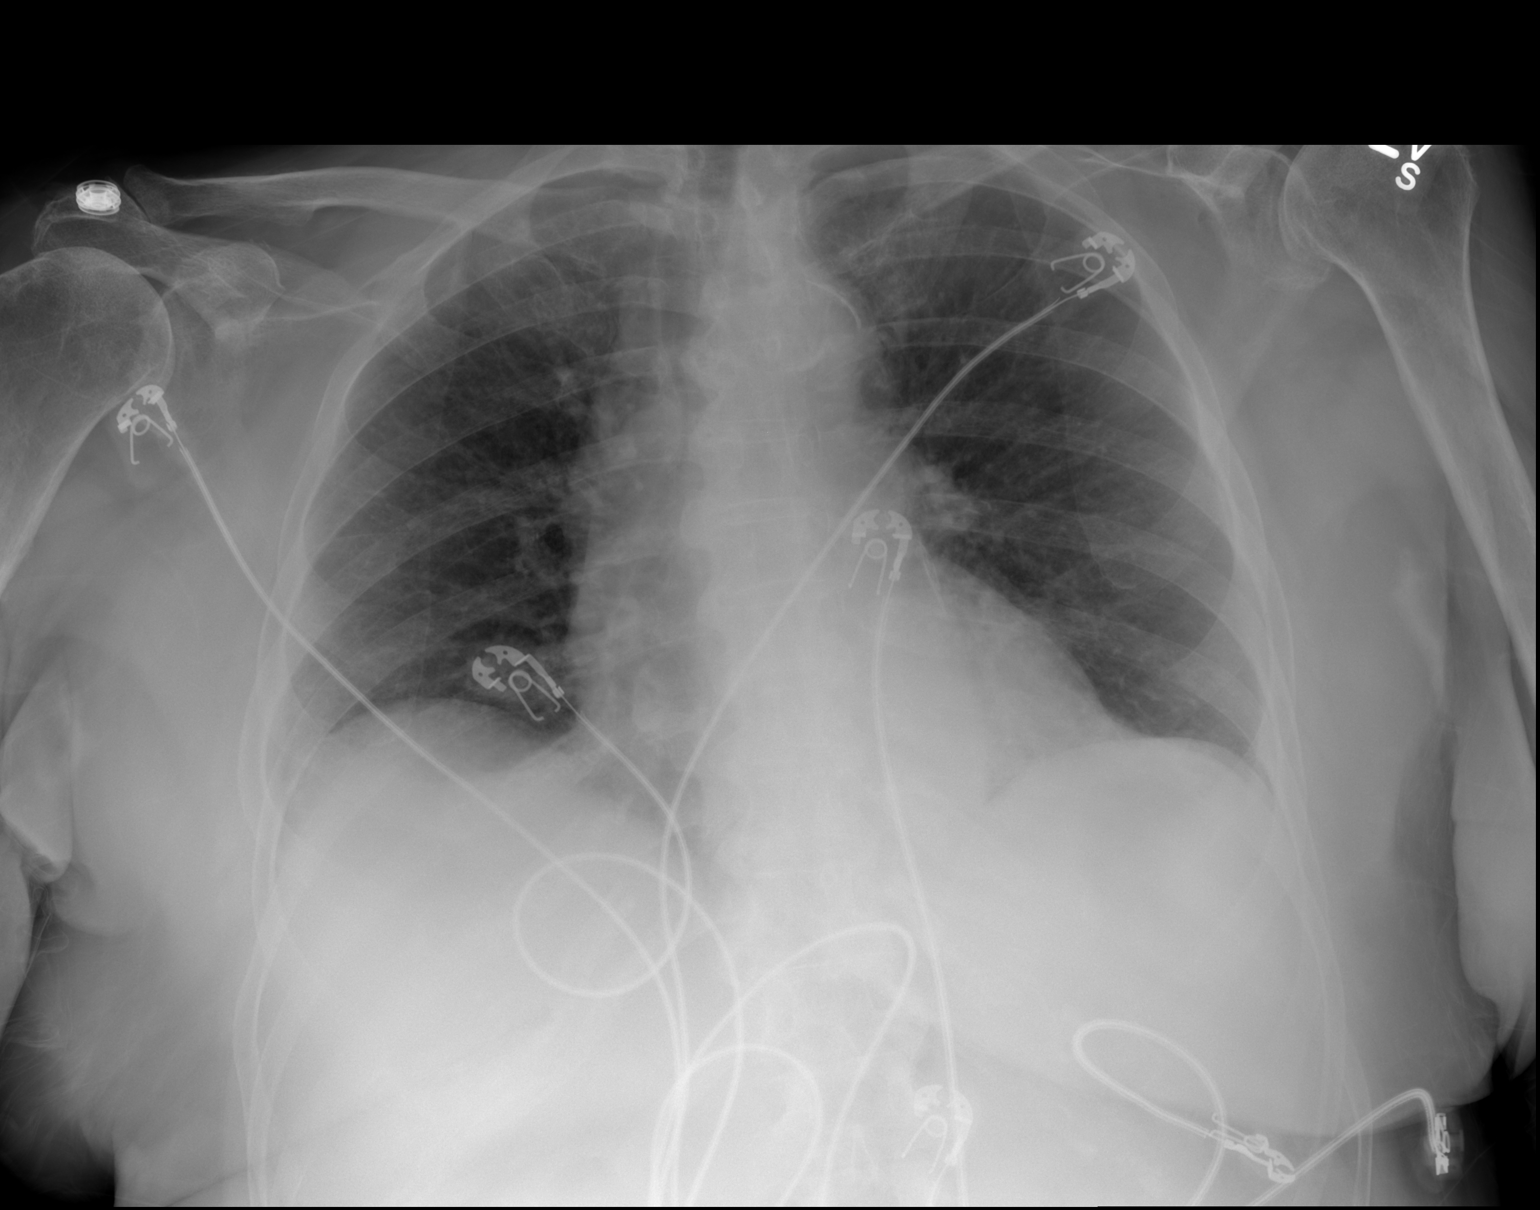

[w chest lat]
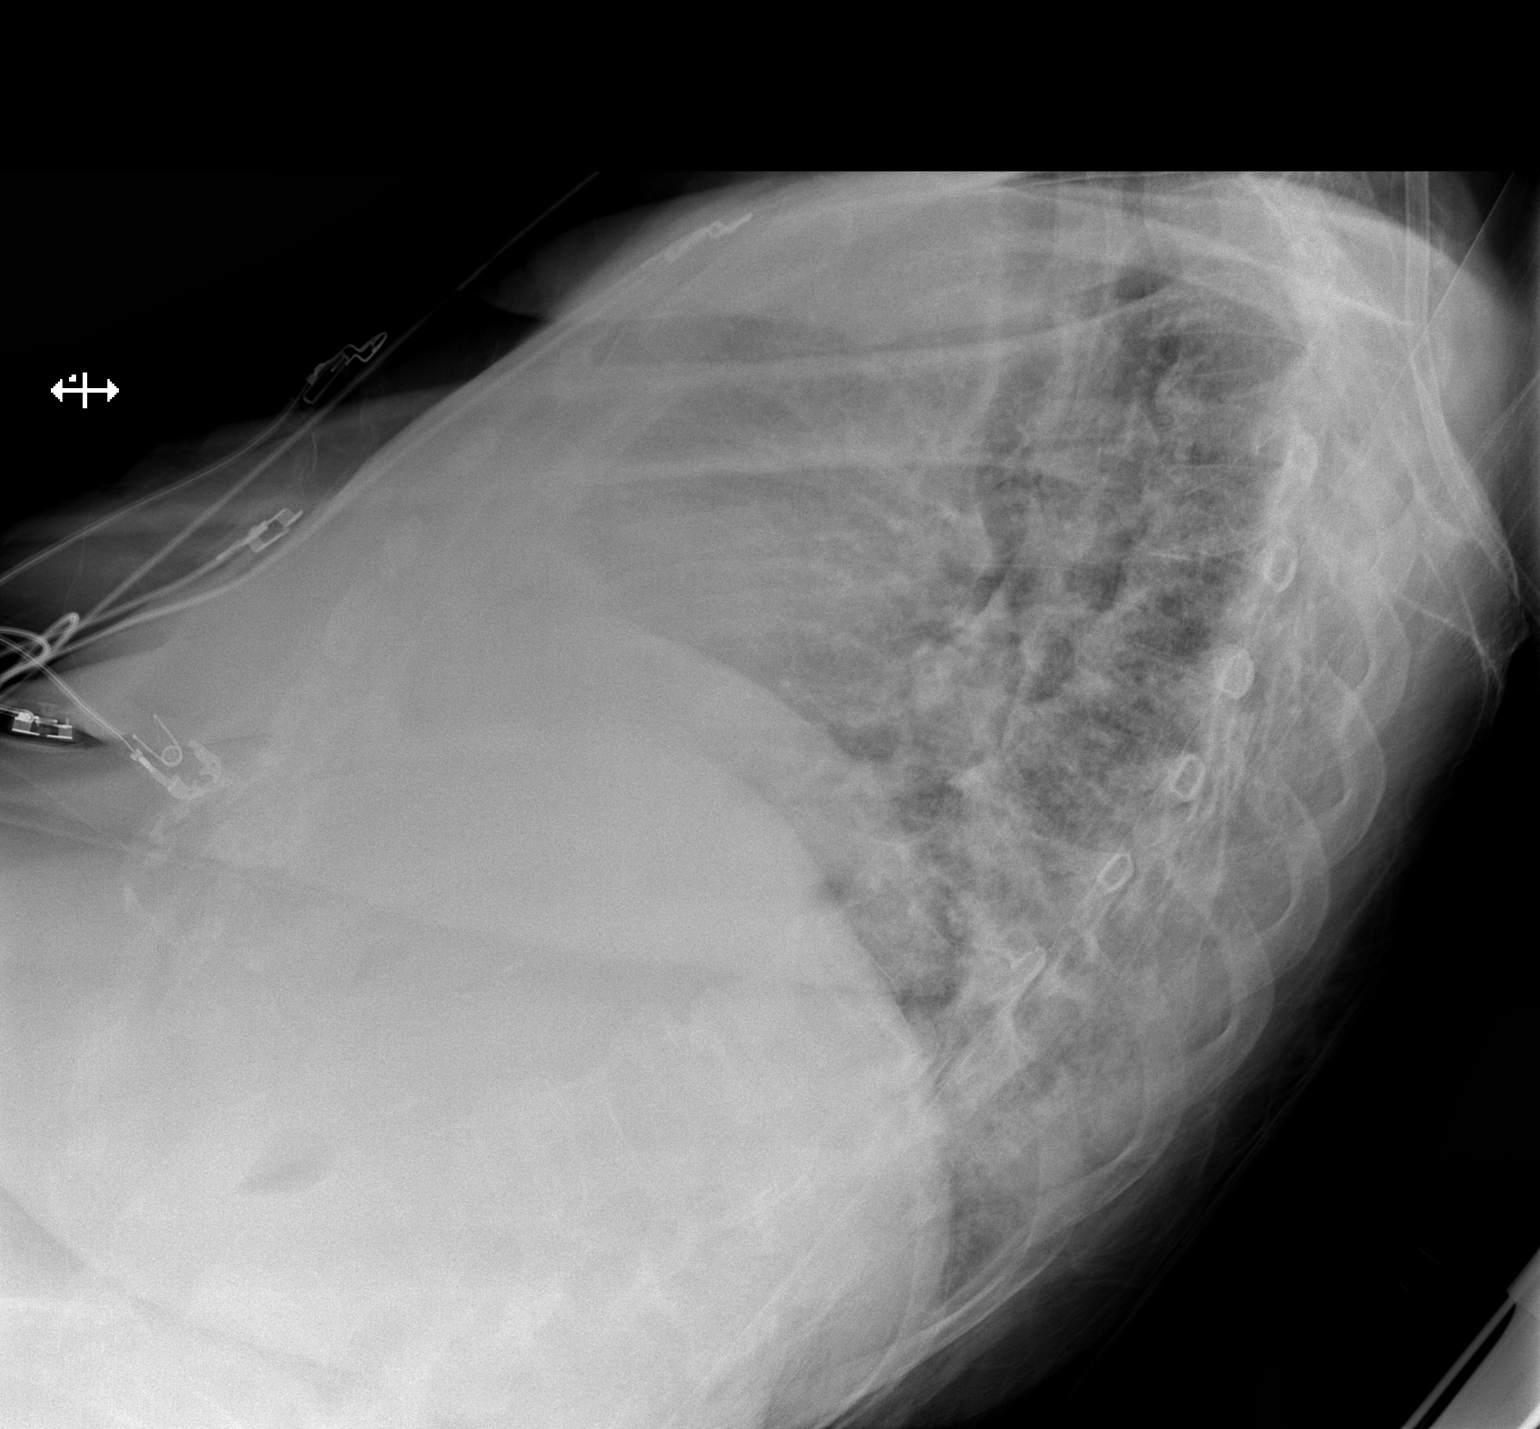

[2 of 2 positions shown; findings below may reference images not displayed]

FINDINGS: Two views of the chest demonstrate mild blunting of the right
costophrenic angle, which may be related to a small pleural
effusion. There is no focal consolidation. No pneumothorax. The
cardiac silhouette is within normal limits. There is degenerative
changes of the osseous structures.
IMPRESSION: No focal consolidation.  Probable small right pleural effusion.

## 2016-09-12 ENCOUNTER — Ambulatory Visit (INDEPENDENT_AMBULATORY_CARE_PROVIDER_SITE_OTHER): Payer: Medicare Other | Admitting: Urgent Care

## 2016-09-12 VITALS — BP 112/68 | HR 100 | Temp 97.8°F | Resp 16 | Ht 62.5 in | Wt 169.2 lb

## 2016-09-12 DIAGNOSIS — N3090 Cystitis, unspecified without hematuria: Secondary | ICD-10-CM

## 2016-09-12 DIAGNOSIS — R21 Rash and other nonspecific skin eruption: Secondary | ICD-10-CM | POA: Diagnosis not present

## 2016-09-12 DIAGNOSIS — R350 Frequency of micturition: Secondary | ICD-10-CM | POA: Diagnosis not present

## 2016-09-12 LAB — POCT URINALYSIS DIP (MANUAL ENTRY)
Bilirubin, UA: NEGATIVE
Glucose, UA: NEGATIVE
Ketones, POC UA: NEGATIVE
Nitrite, UA: NEGATIVE
Protein Ur, POC: NEGATIVE
Spec Grav, UA: <=1.005
Urobilinogen, UA: 0.2
pH, UA: 5.5

## 2016-09-12 LAB — POC MICROSCOPIC URINALYSIS (UMFC): Mucus: ABSENT

## 2016-09-12 MED ORDER — KETOCONAZOLE 2 % EX CREA: 1.0000 "application " | TOPICAL_CREAM | Freq: Every day | CUTANEOUS | 0 refills | Status: DC

## 2016-09-12 MED ORDER — CEPHALEXIN 500 MG PO CAPS: 500.0000 mg | ORAL_CAPSULE | Freq: Two times a day (BID) | ORAL | 0 refills | Status: DC

## 2016-09-12 NOTE — Progress Notes (Signed)
MRN: [id number] DOB: Jul 03, 1922  Subjective:   Catherine Ellis is a 83 y.o. female presenting for chief complaint of Urinary Tract Infection (occasional low back pain, frequent urination)  Urinary frequency - Reports several month history of urinary frequency and urinary urgency, occasional low back pain. Had a fever of 102.81F ~1 week ago. Has been trying to do Keigel exercises as instructed by her therapist. Denies dysuria, hematuria, flank pain, abdominal pain, pelvic pain, cloudy malordorous urine, genital rash, genital irritation and vaginal discharge, nausea and vomiting.   Rash - Reports 1 year history of improving rash over her chest. Rash itches and burns at times, uses diaper rash over that are with good improvement. She would like to have this looked at.   Catherine Ellis has a current medication list which includes the following prescription(s): blood glucose meter kit and supplies, cholecalciferol, fish oil-omega-3 fatty acids, glucosamine-chondroitin, glucose blood, lisinopril, metformin, OVER THE COUNTER MEDICATION, azithromycin, benzonatate, cefuroxime, and meloxicam. Patient has No Known Allergies.  Catherine Ellis  has a past medical history of Allergy; Arthritis; Diabetes mellitus; Hyperlipidemia; Hypertension; and Osteopenia, senile (09/26/2012). Also  has a past surgical history that includes Back surgery; Knee surgery; Appendectomy; Eye surgery; and Gum surgery.  Objective:   Vitals: BP 112/68 (BP Location: Right Arm, Patient Position: Sitting, Cuff Size: Small)   Pulse 100   Temp 97.8 F (36.6 C) (Oral)   Resp 16   Ht 5' 2.5" (1.588 m)   Wt 169 lb 3.2 oz (76.7 kg)   SpO2 95%   BMI 30.45 kg/m   Physical Exam  Constitutional: She is oriented to person, place, and time. She appears well-developed and well-nourished.  HENT:  Mouth/Throat: Oropharynx is clear and moist.  Cardiovascular: Normal rate, regular rhythm and intact distal pulses.  Exam reveals no gallop and no friction  rub.   No murmur heard. Pulmonary/Chest: No respiratory distress. She has no wheezes. She has no rales.  Abdominal: Soft. Bowel sounds are normal. She exhibits no distension and no mass. There is tenderness (lower abdominal/pelvic). There is no guarding.  No CVA tenderness.  Neurological: She is alert and oriented to person, place, and time.  Skin: Skin is warm and dry. Rash (hyperpigmented rash over sternum extending into breast folds medially) noted.   Results for orders placed or performed in visit on 09/12/16 (from the past 24 hour(s))  POCT urinalysis dipstick     Status: Abnormal   Collection Time: 09/12/16  4:12 PM  Result Value Ref Range   Color, UA yellow yellow   Clarity, UA clear clear   Glucose, UA negative negative   Bilirubin, UA negative negative   Ketones, POC UA negative negative   Spec Grav, UA <=1.005    Blood, UA small (A) negative   pH, UA 5.5    Protein Ur, POC negative negative   Urobilinogen, UA 0.2    Nitrite, UA Negative Negative   Leukocytes, UA Trace (A) Negative  POCT Microscopic Urinalysis ([facility])     Status: Abnormal   Collection Time: 09/12/16  4:24 PM  Result Value Ref Range   WBC,UR,HPF,POC None None WBC/hpf   RBC,UR,HPF,POC None None RBC/hpf   Bacteria Few (A) None, Too numerous to count   Mucus Absent Absent   Epithelial Cells, UR Per Microscopy Few (A) None, Too numerous to count cells/hpf   Assessment and Plan :   1. Urinary frequency 2. Cystitis - Urine culture is pending. Will cover for cystitis with Keflex. Advised continued hydration. -  POCT urinalysis dipstick - POCT Microscopic Urinalysis ([facility]) - Urine culture - cephALEXin (KEFLEX) 500 MG capsule; Take 1 capsule (500 mg total) by mouth 2 (two) times daily.  Dispense: 14 capsule; Refill: 0  3. Rash and nonspecific skin eruption - Offered ketoconazole cream. Patient states it is not bothering her but just wanted to make sure she does not have shingles. I do not believe this is  shingles. Patient will check back with her PCP if rash persists.  Jaynee Eagles, PA-C [facility] [phone] 09/12/2016 4:08 PM

## 2016-09-12 NOTE — Patient Instructions (Signed)

## 2016-09-14 LAB — URINE CULTURE

## 2016-10-25 DIAGNOSIS — R2689 Other abnormalities of gait and mobility: Secondary | ICD-10-CM | POA: Diagnosis not present

## 2016-10-25 DIAGNOSIS — N390 Urinary tract infection, site not specified: Secondary | ICD-10-CM | POA: Diagnosis not present

## 2016-10-25 DIAGNOSIS — R5383 Other fatigue: Secondary | ICD-10-CM | POA: Diagnosis not present

## 2016-12-23 DIAGNOSIS — H5310 Unspecified subjective visual disturbances: Secondary | ICD-10-CM | POA: Diagnosis not present

## 2017-01-16 DIAGNOSIS — E119 Type 2 diabetes mellitus without complications: Secondary | ICD-10-CM | POA: Diagnosis not present

## 2017-01-16 DIAGNOSIS — I10 Essential (primary) hypertension: Secondary | ICD-10-CM | POA: Diagnosis not present

## 2017-01-16 DIAGNOSIS — R2689 Other abnormalities of gait and mobility: Secondary | ICD-10-CM | POA: Diagnosis not present

## 2017-01-25 DIAGNOSIS — R2681 Unsteadiness on feet: Secondary | ICD-10-CM | POA: Diagnosis not present

## 2017-01-25 DIAGNOSIS — M6281 Muscle weakness (generalized): Secondary | ICD-10-CM | POA: Diagnosis not present

## 2017-01-27 DIAGNOSIS — R2681 Unsteadiness on feet: Secondary | ICD-10-CM | POA: Diagnosis not present

## 2017-01-27 DIAGNOSIS — M6281 Muscle weakness (generalized): Secondary | ICD-10-CM | POA: Diagnosis not present

## 2017-02-03 DIAGNOSIS — R2681 Unsteadiness on feet: Secondary | ICD-10-CM | POA: Diagnosis not present

## 2017-02-03 DIAGNOSIS — M6281 Muscle weakness (generalized): Secondary | ICD-10-CM | POA: Diagnosis not present

## 2017-02-17 DIAGNOSIS — M6281 Muscle weakness (generalized): Secondary | ICD-10-CM | POA: Diagnosis not present

## 2017-02-17 DIAGNOSIS — R2681 Unsteadiness on feet: Secondary | ICD-10-CM | POA: Diagnosis not present

## 2017-02-23 DIAGNOSIS — M6281 Muscle weakness (generalized): Secondary | ICD-10-CM | POA: Diagnosis not present

## 2017-02-23 DIAGNOSIS — R2681 Unsteadiness on feet: Secondary | ICD-10-CM | POA: Diagnosis not present

## 2017-02-24 DIAGNOSIS — L303 Infective dermatitis: Secondary | ICD-10-CM | POA: Diagnosis not present

## 2017-02-24 DIAGNOSIS — M79671 Pain in right foot: Secondary | ICD-10-CM | POA: Diagnosis not present

## 2017-02-24 DIAGNOSIS — M79672 Pain in left foot: Secondary | ICD-10-CM | POA: Diagnosis not present

## 2017-03-02 DIAGNOSIS — R2681 Unsteadiness on feet: Secondary | ICD-10-CM | POA: Diagnosis not present

## 2017-03-02 DIAGNOSIS — M6281 Muscle weakness (generalized): Secondary | ICD-10-CM | POA: Diagnosis not present

## 2017-03-16 DIAGNOSIS — R2681 Unsteadiness on feet: Secondary | ICD-10-CM | POA: Diagnosis not present

## 2017-03-16 DIAGNOSIS — M6281 Muscle weakness (generalized): Secondary | ICD-10-CM | POA: Diagnosis not present

## 2017-03-20 DIAGNOSIS — R2681 Unsteadiness on feet: Secondary | ICD-10-CM | POA: Diagnosis not present

## 2017-03-20 DIAGNOSIS — M6281 Muscle weakness (generalized): Secondary | ICD-10-CM | POA: Diagnosis not present

## 2017-03-21 DIAGNOSIS — D1801 Hemangioma of skin and subcutaneous tissue: Secondary | ICD-10-CM | POA: Diagnosis not present

## 2017-03-21 DIAGNOSIS — L814 Other melanin hyperpigmentation: Secondary | ICD-10-CM | POA: Diagnosis not present

## 2017-03-21 DIAGNOSIS — L2489 Irritant contact dermatitis due to other agents: Secondary | ICD-10-CM | POA: Diagnosis not present

## 2017-03-29 DIAGNOSIS — M6281 Muscle weakness (generalized): Secondary | ICD-10-CM | POA: Diagnosis not present

## 2017-03-29 DIAGNOSIS — R2681 Unsteadiness on feet: Secondary | ICD-10-CM | POA: Diagnosis not present

## 2017-04-06 DIAGNOSIS — R2681 Unsteadiness on feet: Secondary | ICD-10-CM | POA: Diagnosis not present

## 2017-04-06 DIAGNOSIS — M6281 Muscle weakness (generalized): Secondary | ICD-10-CM | POA: Diagnosis not present

## 2017-04-19 DIAGNOSIS — B354 Tinea corporis: Secondary | ICD-10-CM | POA: Diagnosis not present

## 2017-04-19 DIAGNOSIS — L814 Other melanin hyperpigmentation: Secondary | ICD-10-CM | POA: Diagnosis not present

## 2017-04-25 ENCOUNTER — Ambulatory Visit: Payer: Medicare Other | Admitting: Internal Medicine

## 2017-04-25 ENCOUNTER — Encounter: Payer: Self-pay | Admitting: Internal Medicine

## 2017-04-25 VITALS — BP 132/70 | HR 80 | Temp 98.2°F | Resp 16 | Ht 63.0 in | Wt 161.4 lb

## 2017-04-25 DIAGNOSIS — J320 Chronic maxillary sinusitis: Secondary | ICD-10-CM | POA: Diagnosis not present

## 2017-04-25 DIAGNOSIS — I10 Essential (primary) hypertension: Secondary | ICD-10-CM

## 2017-04-25 DIAGNOSIS — E119 Type 2 diabetes mellitus without complications: Secondary | ICD-10-CM

## 2017-04-25 DIAGNOSIS — M810 Age-related osteoporosis without current pathological fracture: Secondary | ICD-10-CM | POA: Insufficient documentation

## 2017-04-25 DIAGNOSIS — E559 Vitamin D deficiency, unspecified: Secondary | ICD-10-CM | POA: Diagnosis not present

## 2017-04-25 DIAGNOSIS — N3941 Urge incontinence: Secondary | ICD-10-CM

## 2017-04-25 DIAGNOSIS — R413 Other amnesia: Secondary | ICD-10-CM | POA: Diagnosis not present

## 2017-04-25 DIAGNOSIS — M8580 Other specified disorders of bone density and structure, unspecified site: Secondary | ICD-10-CM | POA: Diagnosis not present

## 2017-04-25 MED ORDER — METFORMIN HCL 500 MG PO TABS: 500.0000 mg | ORAL_TABLET | Freq: Every day | ORAL | 0 refills | Status: DC

## 2017-04-25 MED ORDER — MIRABEGRON ER 25 MG PO TB24: 25.0000 mg | ORAL_TABLET | Freq: Every day | ORAL | 3 refills | Status: DC

## 2017-04-25 NOTE — Patient Instructions (Signed)
   Take myrbetriq once a day. This is for your increased urinary urgency and frequency. I will see you back in 3 months for follow up.  Take your metformin 500 mg once a day only.  Office will call you with appointment for your bone scan and with your lab results.

## 2017-04-25 NOTE — Progress Notes (Signed)
[facility]  Provider: Blanchie Serve MD   Location:    [facility]   Place of Service:     PCP: Blanchie Serve, MD Patient Care Team: Blanchie Serve, MD as PCP - General (Internal Medicine)  Extended Emergency Contact Information Primary Emergency Contact: Joyce [place] Phone: [phone] Relation: Niece   Goals of Care: Advanced Directive information Advanced Directives 09/08/2015  Does Patient Have a Medical Advance Directive? Yes  Type of Advance Directive Living will  Does patient want to make changes to medical advance directive? No - Patient declined  Copy of [place] in Chart? No - copy requested      Chief Complaint  Patient presents with  . New Patient (Initial Visit)    establish care  . Medication Refill    No refills needed at this time.    HPI: Patient is a 83 y.o. female seen today to establish care.  Itching- uses aquaphor ointment bid for itching. Itching bothers her at night and can occur anywhere but just one spot at a time. No rash noted. The skin feels rough. Only takes warm shower.   Diabetes- currently on metformin 500 mg daily for last 1 week, before that twice a day. Her friend told her that metformin is dangerous and she decided to change it to once a day. Does not check blood sugarr at home.   Hypertension- currently on lisinopril 10 mg daily. Denies symptom.   Hyperlipidemia- currently takes fish oil.   Vitamin d deficiency- currently on vitamin d supplement  Chronic maxillary sinusitis- denies any sinus pain or pressure at present. Has occasional stuffy nose. Has PND  Past Medical History:  Diagnosis Date  . Allergy   . Arthritis   . Diabetes mellitus   . Hyperlipidemia   . Hypertension   . Osteopenia, senile 09/26/2012   T score - 1.6 Left femur   Past Surgical History:  Procedure Laterality Date  . APPENDECTOMY    . [place]  .  EYE SURGERY    . GUM SURGERY    . KNEE SURGERY      reports that she has never smoked. She has never used smokeless tobacco. She reports that she does not drink alcohol or use drugs. Social History   Social History  . Marital status: Single    Spouse name: N/A  . Number of children: N/A  . Years of education: N/A   Occupational History  . Not on file.   Social History Main Topics  . Smoking status: Never Smoker  . Smokeless tobacco: Never Used  . Alcohol use No  . Drug use: No  . Sexual activity: Not on file   Other Topics Concern  . Not on file   Social History Narrative   Tobacco use, amount per day now: None      Past tobacco use, amount per day: None      How many years did you use tobacco: None      Alcohol use (drinks per week): None      Diet: Regular      Do you drink/eat things with caffeine? Sweet tea      Marital status: Single            What year were you married?      Do you live in a house, apartment, assisted living, condo, trailer? Apartment  Is it one or more stories? 1      How many persons live in your home? 1      Do you have any pets in your home? 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 Yes             How often? Water exercise 5 days a week.        Do you have a living will? Yes      Do you have a DNR form? No            If not, do you want to discuss one? Yes      Do you have signed POA/HPOA forms?  Yes                Functional Status Survey:    Family History  Problem Relation Age of Onset  . Stroke Mother   . Heart disease Father     Health Maintenance  Topic Date Due  . PNA vac Low Risk Adult (2 of 2 - PPSV23) 07/15/2015  . HEMOGLOBIN A1C  01/31/2016  . FOOT EXAM  02/16/2016  . OPHTHALMOLOGY EXAM  04/27/2016  . INFLUENZA VACCINE  02/08/2017  . TETANUS/TDAP  12/15/2021  . DEXA SCAN  Completed    No Known Allergies  Outpatient Encounter Prescriptions as of 04/25/2017    Medication Sig  . blood glucose meter kit and supplies KIT Pt would prefer one touch ultra.  History of controlled DM, checks glucose once daily. Deliver please  . Cholecalciferol (VITAMIN D PO) Take 2,000 Units by mouth daily.  Marland Kitchen glucose blood test strip Test blood sugar daily. Dx code: E11.8  . lisinopril (PRINIVIL,ZESTRIL) 10 MG tablet Take 1 tablet (10 mg total) by mouth daily.  . metFORMIN (GLUCOPHAGE) 500 MG tablet Take 1 tablet (500 mg total) by mouth daily with breakfast.  . OVER THE COUNTER MEDICATION OTC Aquaph Healing Ointment for itching  . [DISCONTINUED] cephALEXin (KEFLEX) 500 MG capsule Take 1 capsule (500 mg total) by mouth 2 (two) times daily.  . [DISCONTINUED] GLUCOSAMINE-CHONDROITIN PO Take 1 tablet by mouth daily.   . [DISCONTINUED] metFORMIN (GLUCOPHAGE) 500 MG tablet TAKE 1 TABLET TWICE DAILY WITH FOOD.  . fish oil-omega-3 fatty acids 1000 MG capsule Take 2 g by mouth daily.  . mirabegron ER (MYRBETRIQ) 25 MG TB24 tablet Take 1 tablet (25 mg total) by mouth daily.  . [DISCONTINUED] ketoconazole (NIZORAL) 2 % cream Apply 1 application topically daily.   No facility-administered encounter medications on file as of 04/25/2017.     Review of Systems  Constitutional: Negative for appetite change, chills, diaphoresis, fever and unexpected weight change.  HENT: Positive for hearing loss and postnasal drip. Negative for congestion, ear discharge, ear pain, mouth sores, nosebleeds, rhinorrhea, sinus pain, sinus pressure, sore throat and trouble swallowing.        Seasonal allergies  Eyes: Positive for visual disturbance. Negative for pain, discharge, redness and itching.       Wears corrective lenses  Respiratory: Positive for cough. Negative for chest tightness, shortness of breath and wheezing.        Brings out clear phlegm in the morning. Ongoing for about a year. Denies hemoptysis. Denies smoking history.   Cardiovascular: Negative for chest pain, palpitations and leg  swelling.  Gastrointestinal: Negative for abdominal pain, blood in stool, constipation, diarrhea, nausea and vomiting.  Endocrine: Negative for polydipsia and polyphagia.  Genitourinary: Positive for frequency and urgency. Negative for dysuria, flank pain and hematuria.       Wakes up 3-5 times a night to urinate.   Musculoskeletal: Positive for arthralgias and back pain.       History of back surgery  Skin: Negative for wound.       Itching present  Neurological: Negative for dizziness, tremors, seizures, syncope, light-headedness, numbness and headaches.  Psychiatric/Behavioral: Positive for confusion, decreased concentration and sleep disturbance. Negative for behavioral problems and hallucinations. The patient is not nervous/anxious.        Concerned about her memory    Vitals:   04/25/17 1001  BP: 132/70  Pulse: 80  Resp: 16  Temp: 98.2 F (36.8 C)  TempSrc: Oral  SpO2: 96%  Weight: 161 lb 6.4 oz (73.2 kg)  Height: _0  (1.6 m)   Body mass index is 28.59 kg/m. Physical Exam  Constitutional: She is oriented to person, place, and time. She appears well-developed and well-nourished. No distress.  HENT:  Head: Normocephalic and atraumatic.  Nose: Nose normal.  Mouth/Throat: Oropharynx is clear and moist. No oropharyngeal exudate.  Eyes: Pupils are equal, round, and reactive to light. Conjunctivae and EOM are normal. Right eye exhibits no discharge. Left eye exhibits no discharge. No scleral icterus.  Corrective glasses  Neck: Normal range of motion. Neck supple. No thyromegaly present.  Cardiovascular: Normal rate, regular rhythm and intact distal pulses.   Pulmonary/Chest: Effort normal and breath sounds normal. No respiratory distress. She has no wheezes. She has no rales. She exhibits no tenderness.  Abdominal: Soft. Bowel sounds are normal. She exhibits no distension. There is no tenderness. There is no guarding.  Musculoskeletal: Normal range of motion. She exhibits no  tenderness.  Trace leg edema, steady gait, arthritis changes to her fingers  Lymphadenopathy:    She has no cervical adenopathy.  Neurological: She is alert and oriented to person, place, and time.  04/25/17- MMSE 28/30. Passed clock draw.   Skin: Skin is warm and dry. No rash noted. She is not diaphoretic. No erythema.  Psychiatric: She has a normal mood and affect. Her behavior is normal.    Labs reviewed: Basic Metabolic Panel: No results for input(s): NA, K, CL, CO2, GLUCOSE, BUN, CREATININE, CALCIUM, MG, PHOS in the last 8760 hours. Liver Function Tests: No results for input(s): AST, ALT, ALKPHOS, BILITOT, PROT, ALBUMIN in the last 8760 hours. No results for input(s): LIPASE, AMYLASE in the last 8760 hours. No results for input(s): AMMONIA in the last 8760 hours. CBC: No results for input(s): WBC, NEUTROABS, HGB, HCT, MCV, PLT in the last 8760 hours. Cardiac Enzymes: No results for input(s): CKTOTAL, CKMB, CKMBINDEX, TROPONINI in the last 8760 hours. BNP: Invalid input(s): POCBNP Lab Results  Component Value Date   HGBA1C 6.5 08/03/2015   Lab Results  Component Value Date   TSH 1.465 06/06/2014   Lab Results  Component Value Date   [id number] 269 12/06/2013   No results found for: FOLATE No results found for: IRON, TIBC, FERRITIN  Lipid Panel: No results for input(s): CHOL, HDL, LDLCALC, TRIG, CHOLHDL, LDLDIRECT in the last 8760 hours. Lab Results  Component Value Date   HGBA1C 6.5 08/03/2015    Assessment/Plan  1. Essential hypertension Continue lisinopril 10 mg daily.  - CMP with eGFR; Future - CBC with Differential/Platelets; Future  2. Chronic maxillary sinusitis Currently symptom free. Monitor.   3. Controlled type 2 diabetes mellitus without complication, without long-term current use of insulin (  [place]) Last a1c 6.5 more than a year back. Advised to take metformin 500 mg once a day only until we review her a1c. On ACEI. Not on statin. Check lipids.  -  Lipid Panel; Future - TSH; Future - Hemoglobin A1c; Future  4. Urge incontinence of urine Ongoing, using pads and diapers. Start myrbetriq 25 mg daily and monitor.   5. Vitamin D deficiency Continue vit d and calcium supplement.  - Vitamin D, 1,25-dihydroxy; Future  6. Memory loss MMSE 28/30 today. Passed clock draw. Supportive care for now.  - CBC with Differential/Platelets; Future - TSH; Future  7. Osteopenia Obtain dexa scan. Continue calcium and vit d supplement    Labs/tests ordered:  As above  Next appointment: 3 months or earlier if needed  Communication: reviewed care plan with patient    Blanchie Serve, MD Internal Medicine [place], [place] [address] Cell Phone (Monday-Friday 8 am - 5 pm): [phone] On Call: [phone] and follow prompts after 5 pm and on weekends Office Phone: [phone] Office Fax: [phone]

## 2017-05-02 DIAGNOSIS — E119 Type 2 diabetes mellitus without complications: Secondary | ICD-10-CM | POA: Diagnosis not present

## 2017-05-02 DIAGNOSIS — Z961 Presence of intraocular lens: Secondary | ICD-10-CM | POA: Diagnosis not present

## 2017-05-02 DIAGNOSIS — H353132 Nonexudative age-related macular degeneration, bilateral, intermediate dry stage: Secondary | ICD-10-CM | POA: Diagnosis not present

## 2017-05-02 DIAGNOSIS — E559 Vitamin D deficiency, unspecified: Secondary | ICD-10-CM | POA: Diagnosis not present

## 2017-05-02 DIAGNOSIS — I10 Essential (primary) hypertension: Secondary | ICD-10-CM | POA: Diagnosis not present

## 2017-05-02 DIAGNOSIS — R413 Other amnesia: Secondary | ICD-10-CM | POA: Diagnosis not present

## 2017-05-02 DIAGNOSIS — H52203 Unspecified astigmatism, bilateral: Secondary | ICD-10-CM | POA: Diagnosis not present

## 2017-05-05 LAB — COMPLETE METABOLIC PANEL WITH GFR
AG Ratio: 1.5 (calc) (ref 1.0–2.5)
ALT: 12 U/L (ref 6–29)
AST: 14 U/L (ref 10–35)
Albumin: 4.3 g/dL (ref 3.6–5.1)
Alkaline phosphatase (APISO): 45 U/L (ref 33–130)
BUN/Creatinine Ratio: 27 (calc) — ABNORMAL HIGH (ref 6–22)
BUN: 25 mg/dL (ref 7–25)
CO2: 23 mmol/L (ref 20–32)
Calcium: 9.6 mg/dL (ref 8.6–10.4)
Chloride: 104 mmol/L (ref 98–110)
Creat: 0.92 mg/dL — ABNORMAL HIGH (ref 0.60–0.88)
GFR, Est African American: 62 mL/min/{1.73_m2} (ref >=60)
GFR, Est Non African American: 54 mL/min/{1.73_m2} — ABNORMAL LOW (ref >=60)
Globulin: 2.8 g/dL (calc) (ref 1.9–3.7)
Glucose, Bld: 129 mg/dL — ABNORMAL HIGH (ref 65–99)
Potassium: 4.4 mmol/L (ref 3.5–5.3)
Sodium: 137 mmol/L (ref 135–146)
Total Bilirubin: 0.6 mg/dL (ref 0.2–1.2)
Total Protein: 7.1 g/dL (ref 6.1–8.1)

## 2017-05-05 LAB — CBC WITH DIFFERENTIAL/PLATELET
Basophils Absolute: 53 cells/uL (ref 0–200)
Basophils Relative: 0.7 %
Eosinophils Absolute: 137 cells/uL (ref 15–500)
Eosinophils Relative: 1.8 %
HCT: 40.7 % (ref 35.0–45.0)
Hemoglobin: 13.9 g/dL (ref 11.7–15.5)
Lymphs Abs: 3336 cells/uL (ref 850–3900)
MCH: 30.8 pg (ref 27.0–33.0)
MCHC: 34.2 g/dL (ref 32.0–36.0)
MCV: 90.2 fL (ref 80.0–100.0)
MPV: 11.3 fL (ref 7.5–12.5)
Monocytes Relative: 9.4 %
Neutro Abs: 3359 cells/uL (ref 1500–7800)
Neutrophils Relative %: 44.2 %
Platelets: 330 10*3/uL (ref 140–400)
RBC: 4.51 10*6/uL (ref 3.80–5.10)
RDW: 12.5 % (ref 11.0–15.0)
Total Lymphocyte: 43.9 %
WBC mixed population: 714 cells/uL (ref 200–950)
WBC: 7.6 10*3/uL (ref 3.8–10.8)

## 2017-05-05 LAB — LIPID PANEL
Cholesterol: 217 mg/dL — ABNORMAL HIGH (ref <200)
HDL: 47 mg/dL — ABNORMAL LOW (ref >50)
LDL Cholesterol (Calc): 140 mg/dL (calc) — ABNORMAL HIGH
Non-HDL Cholesterol (Calc): 170 mg/dL (calc) — ABNORMAL HIGH (ref <130)
Total CHOL/HDL Ratio: 4.6 (calc) (ref <5.0)
Triglycerides: 164 mg/dL — ABNORMAL HIGH (ref <150)

## 2017-05-05 LAB — VITAMIN D 1,25 DIHYDROXY
Vitamin D 1, 25 (OH)2 Total: 31 pg/mL (ref 18–72)
Vitamin D2 1, 25 (OH)2: <8 pg/mL
Vitamin D3 1, 25 (OH)2: 31 pg/mL

## 2017-05-05 LAB — HEMOGLOBIN A1C
Hgb A1c MFr Bld: 5.8 % of total Hgb — ABNORMAL HIGH (ref <5.7)
Mean Plasma Glucose: 120 (calc)
eAG (mmol/L): 6.6 (calc)

## 2017-05-05 LAB — TSH: TSH: 2.47 mIU/L (ref 0.40–4.50)

## 2017-05-11 ENCOUNTER — Non-Acute Institutional Stay: Payer: Medicare Other | Admitting: Nurse Practitioner

## 2017-05-11 ENCOUNTER — Encounter: Payer: Self-pay | Admitting: Nurse Practitioner

## 2017-05-11 DIAGNOSIS — L853 Xerosis cutis: Secondary | ICD-10-CM | POA: Insufficient documentation

## 2017-05-11 DIAGNOSIS — N3941 Urge incontinence: Secondary | ICD-10-CM

## 2017-05-11 DIAGNOSIS — L309 Dermatitis, unspecified: Secondary | ICD-10-CM

## 2017-05-11 NOTE — Patient Instructions (Addendum)
Call in for Mycolog II cream bid 30gm, 5x refills to affected area, Claritin 10mg  qd prn. Increase Myrbetriq to 50mg  po qd, repeat BMP in 2 weeks.

## 2017-05-11 NOTE — Progress Notes (Signed)
 Location:  Clinic [facility]   Place of Service:  Clinic (12) Provider: ManXie  NP  Code Status: DNR Goals of Care: plan of care reviewed with the patient, [place] Advanced Directives 09/08/2015  Does Patient Have a Medical Advance Directive? Yes  Type of Advance Directive Living will  Does patient want to make changes to medical advance directive? No - Patient declined  Copy of Healthcare Power of Attorney in Chart? No - copy requested     Chief Complaint  Patient presents with  . Acute Visit    frequency and urgency, and lower back pain, broke out in a red rash over her body,     HPI: Patient is a 83 y.o. female seen today for an acute visit for patchy, itching, scaly rashes 6-8 weeks, started in the ankles, than scattered in R+L elbows, upper back, posterior right thigh, sizes from a dime to a golf ball, no better on Econazole cream prescribed by Dermatology on site.  Hx of urge urinary incontinence, Myrbetriq helped, nocturnal urination improved from 4x/night from 4-7x/night, denied dysuria, hematuria, lower abdominal or back pain. She is afebrile.   Past Medical History:  Diagnosis Date  . Allergy   . Arthritis   . Diabetes mellitus   . Hyperlipidemia   . Hypertension   . Osteopenia, senile 09/26/2012   T score - 1.6 Left femur    Past Surgical History:  Procedure Laterality Date  . APPENDECTOMY    . BACK SURGERY     1965  . EYE SURGERY    . GUM SURGERY    . KNEE SURGERY      No Known Allergies  Allergies as of 05/11/2017   No Known Allergies     Medication List       Accurate as of 05/11/17  3:00 PM. Always use your most recent med list.          blood glucose meter kit and supplies Kit Pt would prefer one touch ultra.  History of controlled DM, checks glucose once daily. Deliver please   econazole nitrate 1 % cream Apply topically 2 (two) times daily.   fish oil-omega-3 fatty acids 1000 MG capsule Take 2 g by mouth daily.   glucose blood test  strip Test blood sugar daily. Dx code: E11.8   lisinopril 10 MG tablet Commonly known as:  PRINIVIL,ZESTRIL Take 1 tablet (10 mg total) by mouth daily.   mirabegron ER 25 MG Tb24 tablet Commonly known as:  MYRBETRIQ Take 1 tablet (25 mg total) by mouth daily.   OVER THE COUNTER MEDICATION OTC Aquaph Healing Ointment for itching   VITAMIN D PO Take 2,000 Units by mouth daily.       Review of Systems:  Review of Systems  Constitutional: Negative for activity change, appetite change, chills, diaphoresis, fatigue and fever.  Gastrointestinal: Negative for abdominal distention, constipation, diarrhea, nausea and vomiting.  Endocrine: Negative for cold intolerance.  Genitourinary: Positive for frequency. Negative for difficulty urinating, dysuria, hematuria and urgency.  Skin: Positive for rash. Negative for color change, pallor and wound.       Itching rash for about 2 months.   Psychiatric/Behavioral: Negative for agitation, behavioral problems and confusion.    Health Maintenance  Topic Date Due  . PNA vac Low Risk Adult (2 of 2 - PPSV23) 07/15/2015  . FOOT EXAM  02/16/2016  . OPHTHALMOLOGY EXAM  04/27/2016  . INFLUENZA VACCINE  02/08/2017  . HEMOGLOBIN A1C  10/31/2017  . TETANUS/TDAP    12/15/2021  . DEXA SCAN  Completed    Physical Exam: Vitals:   05/11/17 1359  BP: 140/74  Pulse: 92  Resp: 20  Temp: (!) 97.4 F (36.3 C)  TempSrc: Oral  SpO2: 96%  Weight: 166 lb 12.8 oz (75.7 kg)  Height: 5' 3" (1.6 m)   Body mass index is 29.55 kg/m. Physical Exam  Constitutional: She is oriented to person, place, and time. She appears well-developed and well-nourished. No distress.  Abdominal: Soft. Bowel sounds are normal. She exhibits no distension. There is no tenderness. There is no rebound and no guarding.  Neurological: She is alert and oriented to person, place, and time.  Skin: Skin is warm and dry. Rash noted. She is not diaphoretic. No erythema. No pallor.   patchy, itching, scaly rashes 6-8 weeks, started in the ankles, than scattered in R+L elbows, upper back, posterior right thigh, sizes from a dime to a golf ball  Psychiatric: She has a normal mood and affect. Her behavior is normal. Judgment and thought content normal.    Labs reviewed: Basic Metabolic Panel:  Recent Labs  05/02/17 0730  NA 137  K 4.4  CL 104  CO2 23  GLUCOSE 129*  BUN 25  CREATININE 0.92*  CALCIUM 9.6  TSH 2.47   Liver Function Tests:  Recent Labs  05/02/17 0730  AST 14  ALT 12  BILITOT 0.6  PROT 7.1   No results for input(s): LIPASE, AMYLASE in the last 8760 hours. No results for input(s): AMMONIA in the last 8760 hours. CBC:  Recent Labs  05/02/17 0730  WBC 7.6  NEUTROABS 3,359  HGB 13.9  HCT 40.7  MCV 90.2  PLT 330   Lipid Panel:  Recent Labs  05/02/17 0730  CHOL 217*  HDL 47*  TRIG 164*  CHOLHDL 4.6   Lab Results  Component Value Date   HGBA1C 5.8 (H) 05/02/2017    Procedures since last visit: No results found.  Assessment/Plan Dermatitis patchy, itching, scaly rashes 6-8 weeks, started in the ankles, than scattered in R+L elbows, upper back, posterior right thigh, sizes from a dime to a golf ball, no better on Econazole cream prescribed by Dermatology on site. Will try Mycolog II cream for now, f/u Dermatology. Claritin 43m qd prn  Incontinent of urine Myrbetriq helps, nocturnal urination improved from 4x/night from 4-7x/night, may try Myrbetriq 570mpo qd, CrCL 32-37, CrCL 15-29 max 2587may.  Repeat BMP 2 weeks.     Labs/tests ordered: BMP 2 weeks.   Next appt:  07/18/2017  Time spend 25 minutes

## 2017-05-11 NOTE — Assessment & Plan Note (Addendum)
patchy, itching, scaly rashes 6-8 weeks, started in the ankles, than scattered in R+L elbows, upper back, posterior right thigh, sizes from a dime to a golf ball, no better on Econazole cream prescribed by Dermatology on site. Will try Mycolog II cream for now, f/u Dermatology. Claritin 10mg  qd prn

## 2017-05-11 NOTE — Assessment & Plan Note (Signed)
Myrbetriq helps, nocturnal urination improved from 4x/night from 4-7x/night, may try Myrbetriq 50mg  po qd, CrCL 32-37, CrCL 15-29 max 25mg /day.  Repeat BMP 2 weeks.

## 2017-05-12 MED ORDER — MIRABEGRON ER 50 MG PO TB24: 50.0000 mg | ORAL_TABLET | Freq: Every day | ORAL | 2 refills | Status: DC

## 2017-05-12 MED ORDER — NYSTATIN-TRIAMCINOLONE 100000-0.1 UNIT/GM-% EX CREA: TOPICAL_CREAM | Freq: Two times a day (BID) | CUTANEOUS | Status: DC

## 2017-05-15 ENCOUNTER — Other Ambulatory Visit: Payer: Self-pay | Admitting: *Deleted

## 2017-05-15 DIAGNOSIS — L309 Dermatitis, unspecified: Secondary | ICD-10-CM

## 2017-05-15 MED ORDER — NYSTATIN-TRIAMCINOLONE 100000-0.1 UNIT/GM-% EX OINT: 1.0000 "application " | TOPICAL_OINTMENT | Freq: Two times a day (BID) | CUTANEOUS | 0 refills | Status: DC

## 2017-05-23 ENCOUNTER — Telehealth: Payer: Self-pay | Admitting: *Deleted

## 2017-05-23 DIAGNOSIS — R32 Unspecified urinary incontinence: Secondary | ICD-10-CM | POA: Diagnosis not present

## 2017-05-23 DIAGNOSIS — R278 Other lack of coordination: Secondary | ICD-10-CM | POA: Diagnosis not present

## 2017-05-23 NOTE — Telephone Encounter (Signed)
Resident arrived to pick up her lab results at 3:55 pm today.

## 2017-05-23 NOTE — Telephone Encounter (Signed)
Called resident to inform her that I have printed the lab results that she requested, I was unable to speak with her so I left a voice mail on her phone. I will continue to call her until I leave today.

## 2017-05-24 ENCOUNTER — Other Ambulatory Visit: Payer: No Typology Code available for payment source

## 2017-05-24 DIAGNOSIS — L259 Unspecified contact dermatitis, unspecified cause: Secondary | ICD-10-CM | POA: Diagnosis not present

## 2017-05-24 DIAGNOSIS — D1801 Hemangioma of skin and subcutaneous tissue: Secondary | ICD-10-CM | POA: Diagnosis not present

## 2017-05-24 DIAGNOSIS — R21 Rash and other nonspecific skin eruption: Secondary | ICD-10-CM | POA: Diagnosis not present

## 2017-05-24 DIAGNOSIS — L814 Other melanin hyperpigmentation: Secondary | ICD-10-CM | POA: Diagnosis not present

## 2017-05-25 ENCOUNTER — Ambulatory Visit
Admission: RE | Admit: 2017-05-25 | Discharge: 2017-05-25 | Disposition: A | Discharge status: Home / Self Care | Payer: Medicare Other | Source: Ambulatory Visit | Attending: Internal Medicine | Admitting: Internal Medicine

## 2017-05-25 DIAGNOSIS — M8580 Other specified disorders of bone density and structure, unspecified site: Secondary | ICD-10-CM

## 2017-05-25 DIAGNOSIS — Z780 Asymptomatic menopausal state: Secondary | ICD-10-CM | POA: Diagnosis not present

## 2017-05-25 DIAGNOSIS — L309 Dermatitis, unspecified: Secondary | ICD-10-CM | POA: Diagnosis not present

## 2017-05-25 DIAGNOSIS — M8589 Other specified disorders of bone density and structure, multiple sites: Secondary | ICD-10-CM | POA: Diagnosis not present

## 2017-05-25 LAB — BASIC METABOLIC PANEL WITH GFR
BUN: 21 mg/dL (ref 7–25)
CO2: 27 mmol/L (ref 20–32)
Calcium: 9.8 mg/dL (ref 8.6–10.4)
Chloride: 103 mmol/L (ref 98–110)
Creat: 0.85 mg/dL (ref 0.60–0.88)
GFR, Est African American: 68 mL/min/{1.73_m2} (ref >=60)
GFR, Est Non African American: 59 mL/min/{1.73_m2} — ABNORMAL LOW (ref >=60)
Glucose, Bld: 134 mg/dL — ABNORMAL HIGH (ref 65–99)
Potassium: 4.6 mmol/L (ref 3.5–5.3)
Sodium: 138 mmol/L (ref 135–146)

## 2017-05-26 ENCOUNTER — Other Ambulatory Visit: Payer: Self-pay

## 2017-05-26 MED ORDER — CALCIUM CARBONATE-VITAMIN D3 600-400 MG-UNIT PO TABS: 1.0000 | ORAL_TABLET | Freq: Two times a day (BID) | ORAL | 1 refills | Status: DC

## 2017-06-05 ENCOUNTER — Telehealth: Payer: Self-pay | Admitting: Internal Medicine

## 2017-06-05 NOTE — Telephone Encounter (Signed)
Left message asking pt to schedule AWV-S (Last AWV 10/05/15) at [facility] clinic on 06/09/17. VDM (DD)

## 2017-06-09 ENCOUNTER — Non-Acute Institutional Stay: Payer: Medicare Other

## 2017-06-09 VITALS — BP 138/62 | HR 96 | Temp 98.0°F | Ht 63.0 in | Wt 162.0 lb

## 2017-06-09 DIAGNOSIS — Z0000 Encounter for general adult medical examination without abnormal findings: Secondary | ICD-10-CM

## 2017-06-09 NOTE — Progress Notes (Signed)
Subjective:   Catherine Ellis is a 83 y.o. female who presents for Medicare Annual (Subsequent) preventive examination at [facility]  Last AWV: 10/05/3015       Objective:     Vitals: BP 138/62 (BP Location: Left Arm, Patient Position: Sitting)   Pulse 96   Temp 98 F (36.7 C) (Oral)   Ht _0  (1.6 m)   Wt 162 lb (73.5 kg)   SpO2 98%   BMI 28.70 kg/m   Body mass index is 28.7 kg/m.  Advanced Directives 06/09/2017 09/08/2015  Does Patient Have a Medical Advance Directive? Yes Yes  Type of Paramedic of [place];Living will;Out of facility DNR (pink MOST or yellow form) Living will  Does patient want to make changes to medical advance directive? No - Patient declined No - Patient declined  Copy of [place] in Chart? Yes No - copy requested  Pre-existing out of facility DNR order (yellow form or pink MOST form) Yellow form placed in chart (order not valid for inpatient use);Pink MOST form placed in chart (order not valid for inpatient use) -    Tobacco Social History   Tobacco Use  Smoking Status Never Smoker  Smokeless Tobacco Never Used     Counseling given: Not Answered   Clinical Intake:  Pre-visit preparation completed: No  Pain : No/denies pain     Nutritional Status: BMI 25 -29 Overweight Nutritional Risks: None  Activities of Daily Living: Independent Ambulation: Independent with device- listed below Home Assistive Devices/Equipment: Dentures (specify type), Eyeglasses, Cane(bridges) Medication Administration: Independent Home Management: Independent  Barriers to Care Management & Learning: None  Do you feel unsafe in your current relationship?: No Do you feel physically threatened by others?: No Anyone hurting you at home, work, or school?: No Unable to ask?: No Information provided on Community resources: No  How often do you need to have someone help you when  you read instructions, pamphlets, or other written materials from your doctor or pharmacy?: 1 - Never What is the last grade level you completed in school?: Associates  Interpreter Needed?: No  Information entered by :: Rich Reining, RN  Past Medical History:  Diagnosis Date  . Allergy   . Arthritis   . Diabetes mellitus   . Hernia, abdominal   . Hyperlipidemia   . Hypertension   . Osteopenia, senile 09/26/2012   T score - 1.6 Left femur   Past Surgical History:  Procedure Laterality Date  . APPENDECTOMY    . [place]  . EYE SURGERY    . GUM SURGERY    . KNEE SURGERY     Family History  Problem Relation Age of Onset  . Stroke Mother   . Heart disease Father    Social History   Socioeconomic History  . Marital status: Single    Spouse name: None  . Number of children: None  . Years of education: None  . Highest education level: None  Social Needs  . Financial resource strain: Not hard at all  . Food insecurity - worry: Never true  . Food insecurity - inability: Never true  . Transportation needs - medical: No  . Transportation needs - non-medical: No  Occupational History  . None  Tobacco Use  . Smoking status: Never Smoker  . Smokeless tobacco: Never Used  Substance and Sexual Activity  . Alcohol use: No  . Drug use: No  .  Sexual activity: None  Other Topics Concern  . None  Social History Narrative   Tobacco use, amount per day now: None      Past tobacco use, amount per day: None      How many years did you use tobacco: None      Alcohol use (drinks per week): None      Diet: Regular      Do you drink/eat things with caffeine? Sweet tea      Marital status: Single            What year were you married?      Do you live in a house, apartment, assisted living, condo, trailer? Apartment      Is it one or more stories? 1      How many persons live in your home? 1      Do you have any pets in your home? 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 Yes             How often? Water exercise 5 days a week.        Do you have a living will? Yes      Do you have a DNR form? No            If not, do you want to discuss one? Yes      Do you have signed POA/HPOA forms?  Yes             Outpatient Encounter Medications as of 06/09/2017  Medication Sig  . blood glucose meter kit and supplies KIT Pt would prefer one touch ultra.  History of controlled DM, checks glucose once daily. Deliver please  . Calcium Carbonate-Vitamin D3 (CALCIUM 600-D) 600-400 MG-UNIT TABS Take 1 tablet 2 (two) times daily by mouth.  . Cholecalciferol (VITAMIN D PO) Take 2,000 Units by mouth daily.  Marland Kitchen econazole nitrate 1 % cream Apply topically 2 (two) times daily.  . fish oil-omega-3 fatty acids 1000 MG capsule Take 2 g by mouth daily.  Marland Kitchen glucose blood test strip Test blood sugar daily. Dx code: E11.8  . lisinopril (PRINIVIL,ZESTRIL) 10 MG tablet Take 1 tablet (10 mg total) by mouth daily.  Marland Kitchen nystatin-triamcinolone ointment (MYCOLOG) Apply 1 application 2 (two) times daily topically.  . mirabegron ER (MYRBETRIQ) 50 MG TB24 tablet Take 1 tablet (50 mg total) by mouth daily. (Patient not taking: Reported on 06/09/2017)  . [DISCONTINUED] OVER THE COUNTER MEDICATION OTC Aquaph Healing Ointment for itching   No facility-administered encounter medications on file as of 06/09/2017.     Activities of Daily Living In your present state of health, do you have any difficulty performing the following activities: 06/09/2017  Hearing? N  Vision? N  Difficulty concentrating or making decisions? Y  Walking or climbing stairs? N  Dressing or bathing? N  Doing errands, shopping? N  Preparing Food and eating ? N  Using the Toilet? N  In the past six months, have you accidently leaked urine? Y  Do you have problems with loss of bowel control? N  Managing your Medications? N  Managing your Finances? N  Housekeeping or managing  your Housekeeping? N  Some recent data might be hidden    Timed Get Up and Go performed: 9 seconds to do 10 ft-within normal limits  Patient Care Team: Blanchie Serve, MD as PCP - General (Internal Medicine)  Assessment:     Exercise Activities and Dietary recommendations Current Exercise Habits: The patient does not participate in regular exercise at present  Goals    . DIET - INCREASE WATER INTAKE     Patient will increase water intake to 8 glasses a day      Fall Risk Fall Risk  06/09/2017 09/12/2016 08/03/2015 02/16/2015 07/14/2014  Falls in the past year? No No No Yes No  Comment - - - unsteady gait, did not "hurt self" -  Number falls in past yr: - - - - -  Injury with Fall? - - - - -  Risk for fall due to : - - - - -  Risk for fall due to: Comment - - - - -   Is the patient's home free of loose throw rugs in walkways, pet beds, electrical cords, etc?   yes      Grab bars in the bathroom? yes      Handrails on the stairs?   yes      Adequate lighting?   yes  Depression Screen PHQ 2/9 Scores 06/09/2017 09/12/2016 08/03/2015 04/27/2015  PHQ - 2 Score 0 0 0 0     Cognitive Function MMSE - Mini Mental State Exam 04/25/2017 04/25/2017  Not completed: (No Data) -  Orientation to time 5 5  Orientation to Place 5 5  Registration 3 3  Attention/ Calculation 4 4  Recall 1 1  Language- name 2 objects 2 2  Language- repeat 1 1  Language- follow 3 step command 3 3  Language- read & follow direction 1 1  Write a sentence 1 1  Copy design 1 1  Total score 27 27        Immunization History  Administered Date(s) Administered  . Influenza Split 04/13/2012  . Influenza,inj,Quad PF,6+ Mos 06/14/2013, 04/14/2014, 04/27/2015  . Influenza-Unspecified 04/10/2016  . PPD Test 12/16/2011, 12/16/2011  . Pneumococcal Conjugate-13 07/14/2014  . Pneumococcal Polysaccharide-23 01/04/2010  . Tdap 12/16/2011   Screening Tests Health Maintenance  Topic Date Due  . PNA vac Low Risk  Adult (2 of 2 - PPSV23) 07/15/2015  . FOOT EXAM  02/16/2016  . INFLUENZA VACCINE  02/08/2017  . HEMOGLOBIN A1C  10/31/2017  . OPHTHALMOLOGY EXAM  04/10/2018  . TETANUS/TDAP  12/15/2021  . DEXA SCAN  Completed   Cancer Screenings: Lung: Low Dose CT Chest recommended if Age 79-80 years, 30 pack-year currently smoking OR have quit w/in 15years. Patient does not qualify. Breast: Up to date on Mammogram? Yes  Up to date of Bone Density/Dexa? Yes Colorectal: Up to date, pt is over age 80  Additional Screenings: Hepatitis B/HIV/Syphillis: Declined Hepatitis C Screening: Declined     Plan:    I have personally reviewed and addressed the Medicare Annual Wellness questionnaire and have noted the following in the patient's chart:  A. Medical and social history B. Use of alcohol, tobacco or illicit drugs  C. Current medications and supplements D. Functional ability and status E.  Nutritional status F.  Physical activity G. Advance directives H. List of other physicians I.  Hospitalizations, surgeries, and ER visits in previous 12 months J.  [place] to include hearing, vision, cognitive, depression L. Referrals and appointments - none  In addition, I have reviewed and discussed with patient certain preventive protocols, quality metrics, and best practice recommendations. A written personalized care plan for preventive services as well as general preventive health recommendations were provided to patient.  See attached  scanned questionnaire for additional information.   Signed,   Rich Reining, RN Nurse Health Advisor   Quick Notes   Health Maintenance: Foot exam due. Patient is going to go to pharmacy to get flu shot.     Abnormal Screen: MMSE 27/30 on 04/25/2017     Patient Concerns: Itchy rashes on L leg-seeing dermatologist.     Nurse Concerns: None

## 2017-06-09 NOTE — Patient Instructions (Signed)
Catherine Ellis , Thank you for taking time to come for your Medicare Wellness Visit. I appreciate your ongoing commitment to your health goals. Please review the following plan we discussed and let me know if I can assist you in the future.   Screening recommendations/referrals: Colonoscopy excluded, you are over age 81 Mammogram excluded, you are over gae 75 Bone Density up to date Recommended yearly ophthalmology/optometry visit for glaucoma screening and checkup Recommended yearly dental visit for hygiene and checkup  Vaccinations: Influenza vaccine due, please get at your local pharmacy Pneumococcal vaccine up to date Tdap vaccine up to date. Due 01/04/2022 Shingles vaccine due, declined    Advanced directives: In Chart  Conditions/risks identified: None  Next appointment: Dr. Bubba Camp 07/18/2017 @ 11:30am   Preventive Care 65 Years and Older, Female Preventive care refers to lifestyle choices and visits with your health care provider that can promote health and wellness. What does preventive care include?  A yearly physical exam. This is also called an annual well check.  Dental exams once or twice a year.  Routine eye exams. Ask your health care provider how often you should have your eyes checked.  Personal lifestyle choices, including:  Daily care of your teeth and gums.  Regular physical activity.  Eating a healthy diet.  Avoiding tobacco and drug use.  Limiting alcohol use.  Practicing safe sex.  Taking low-dose aspirin every day.  Taking vitamin and mineral supplements as recommended by your health care provider. What happens during an annual well check? The services and screenings done by your health care provider during your annual well check will depend on your age, overall health, lifestyle risk factors, and family history of disease. Counseling  Your health care provider may ask you questions about your:  Alcohol use.  Tobacco use.  Drug  use.  Emotional well-being.  Home and relationship well-being.  Sexual activity.  Eating habits.  History of falls.  Memory and ability to understand (cognition).  Work and work Statistician.  Reproductive health. Screening  You may have the following tests or measurements:  Height, weight, and BMI.  Blood pressure.  Lipid and cholesterol levels. These may be checked every 5 years, or more frequently if you are over 45 years old.  Skin check.  Lung cancer screening. You may have this screening every year starting at age 77 if you have a 30-pack-year history of smoking and currently smoke or have quit within the past 15 years.  Fecal occult blood test (FOBT) of the stool. You may have this test every year starting at age 19.  Flexible sigmoidoscopy or colonoscopy. You may have a sigmoidoscopy every 5 years or a colonoscopy every 10 years starting at age 63.  Hepatitis C blood test.  Hepatitis B blood test.  Sexually transmitted disease (STD) testing.  Diabetes screening. This is done by checking your blood sugar (glucose) after you have not eaten for a while (fasting). You may have this done every 1-3 years.  Bone density scan. This is done to screen for osteoporosis. You may have this done starting at age 40.  Mammogram. This may be done every 1-2 years. Talk to your health care provider about how often you should have regular mammograms. Talk with your health care provider about your test results, treatment options, and if necessary, the need for more tests. Vaccines  Your health care provider may recommend certain vaccines, such as:  Influenza vaccine. This is recommended every year.  Tetanus, diphtheria, and acellular  pertussis (Tdap, Td) vaccine. You may need a Td booster every 10 years.  Zoster vaccine. You may need this after age 90.  Pneumococcal 13-valent conjugate (PCV13) vaccine. One dose is recommended after age 84.  Pneumococcal polysaccharide  (PPSV23) vaccine. One dose is recommended after age 70. Talk to your health care provider about which screenings and vaccines you need and how often you need them. This information is not intended to replace advice given to you by your health care provider. Make sure you discuss any questions you have with your health care provider. Document Released: 07/24/2015 Document Revised: 03/16/2016 Document Reviewed: 04/28/2015 Elsevier Interactive Patient Education  2017 [place] Prevention in the Home Falls can cause injuries. They can happen to people of all ages. There are many things you can do to make your home safe and to help prevent falls. What can I do on the outside of my home?  Regularly fix the edges of walkways and driveways and fix any cracks.  Remove anything that might make you trip as you walk through a door, such as a raised step or threshold.  Trim any bushes or trees on the path to your home.  Use bright outdoor lighting.  Clear any walking paths of anything that might make someone trip, such as rocks or tools.  Regularly check to see if handrails are loose or broken. Make sure that both sides of any steps have handrails.  Any raised decks and porches should have guardrails on the edges.  Have any leaves, snow, or ice cleared regularly.  Use sand or salt on walking paths during winter.  Clean up any spills in your garage right away. This includes oil or grease spills. What can I do in the bathroom?  Use night lights.  Install grab bars by the toilet and in the tub and shower. Do not use towel bars as grab bars.  Use non-skid mats or decals in the tub or shower.  If you need to sit down in the shower, use a plastic, non-slip stool.  Keep the floor dry. Clean up any water that spills on the floor as soon as it happens.  Remove soap buildup in the tub or shower regularly.  Attach bath mats securely with double-sided non-slip rug tape.  Do not have  throw rugs and other things on the floor that can make you trip. What can I do in the bedroom?  Use night lights.  Make sure that you have a light by your bed that is easy to reach.  Do not use any sheets or blankets that are too big for your bed. They should not hang down onto the floor.  Have a firm chair that has side arms. You can use this for support while you get dressed.  Do not have throw rugs and other things on the floor that can make you trip. What can I do in the kitchen?  Clean up any spills right away.  Avoid walking on wet floors.  Keep items that you use a lot in easy-to-reach places.  If you need to reach something above you, use a strong step stool that has a grab bar.  Keep electrical cords out of the way.  Do not use floor polish or wax that makes floors slippery. If you must use wax, use non-skid floor wax.  Do not have throw rugs and other things on the floor that can make you trip. What can I do with my stairs?  Do not leave any items on the stairs.  Make sure that there are handrails on both sides of the stairs and use them. Fix handrails that are broken or loose. Make sure that handrails are as long as the stairways.  Check any carpeting to make sure that it is firmly attached to the stairs. Fix any carpet that is loose or worn.  Avoid having throw rugs at the top or bottom of the stairs. If you do have throw rugs, attach them to the floor with carpet tape.  Make sure that you have a light switch at the top of the stairs and the bottom of the stairs. If you do not have them, ask someone to add them for you. What else can I do to help prevent falls?  Wear shoes that:  Do not have high heels.  Have rubber bottoms.  Are comfortable and fit you well.  Are closed at the toe. Do not wear sandals.  If you use a stepladder:  Make sure that it is fully opened. Do not climb a closed stepladder.  Make sure that both sides of the stepladder are  locked into place.  Ask someone to hold it for you, if possible.  Clearly mark and make sure that you can see:  Any grab bars or handrails.  First and last steps.  Where the edge of each step is.  Use tools that help you move around (mobility aids) if they are needed. These include:  Canes.  Walkers.  Scooters.  Crutches.  Turn on the lights when you go into a dark area. Replace any light bulbs as soon as they burn out.  Set up your furniture so you have a clear path. Avoid moving your furniture around.  If any of your floors are uneven, fix them.  If there are any pets around you, be aware of where they are.  Review your medicines with your doctor. Some medicines can make you feel dizzy. This can increase your chance of falling. Ask your doctor what other things that you can do to help prevent falls. This information is not intended to replace advice given to you by your health care provider. Make sure you discuss any questions you have with your health care provider. Document Released: 04/23/2009 Document Revised: 12/03/2015 Document Reviewed: 08/01/2014 Elsevier Interactive Patient Education  2017 [employer].

## 2017-06-21 DIAGNOSIS — L814 Other melanin hyperpigmentation: Secondary | ICD-10-CM | POA: Diagnosis not present

## 2017-06-21 DIAGNOSIS — L2489 Irritant contact dermatitis due to other agents: Secondary | ICD-10-CM | POA: Diagnosis not present

## 2017-07-10 ENCOUNTER — Other Ambulatory Visit: Payer: Self-pay | Admitting: Internal Medicine

## 2017-07-18 ENCOUNTER — Encounter: Payer: Self-pay | Admitting: Internal Medicine

## 2017-07-20 ENCOUNTER — Encounter: Payer: Self-pay | Admitting: Nurse Practitioner

## 2017-07-20 ENCOUNTER — Non-Acute Institutional Stay: Payer: Medicare Other | Admitting: Nurse Practitioner

## 2017-07-20 VITALS — BP 124/62 | HR 88 | Temp 97.5°F | Resp 20 | Ht 63.0 in | Wt 161.8 lb

## 2017-07-20 DIAGNOSIS — R413 Other amnesia: Secondary | ICD-10-CM

## 2017-07-20 DIAGNOSIS — N3942 Incontinence without sensory awareness: Secondary | ICD-10-CM | POA: Diagnosis not present

## 2017-07-20 DIAGNOSIS — E785 Hyperlipidemia, unspecified: Secondary | ICD-10-CM | POA: Diagnosis not present

## 2017-07-20 DIAGNOSIS — E1121 Type 2 diabetes mellitus with diabetic nephropathy: Secondary | ICD-10-CM

## 2017-07-20 DIAGNOSIS — E119 Type 2 diabetes mellitus without complications: Secondary | ICD-10-CM | POA: Diagnosis not present

## 2017-07-20 DIAGNOSIS — I270 Primary pulmonary hypertension: Secondary | ICD-10-CM | POA: Diagnosis not present

## 2017-07-20 DIAGNOSIS — E7800 Pure hypercholesterolemia, unspecified: Secondary | ICD-10-CM | POA: Diagnosis not present

## 2017-07-20 DIAGNOSIS — E1169 Type 2 diabetes mellitus with other specified complication: Secondary | ICD-10-CM | POA: Diagnosis not present

## 2017-07-20 DIAGNOSIS — I10 Essential (primary) hypertension: Secondary | ICD-10-CM

## 2017-07-20 NOTE — Assessment & Plan Note (Signed)
urinary frequency, occasional urinary leakage is managed with Myrbetriq, nocturnal urinary frequency average is 3-5x/nigh

## 2017-07-20 NOTE — Assessment & Plan Note (Signed)
The patient hs history of T2DM, diet controlled, no longer able to check CBG at home on her own, she managed her diet, last Hgb 5.8  05/05/17. Update Hgb a1c, CBC prior to the next appointment.

## 2017-07-20 NOTE — Assessment & Plan Note (Signed)
History of elevated cholesterol 217, LDL 140 05/05/17, continue fish oil 2gm daily. Update lipid panel prior to next visit.

## 2017-07-20 NOTE — Progress Notes (Signed)
Location:   [facility]   Place of Service:  Clinic (12) Provider: Marlana Latus NP  Code Status: DNR Goals of Care:  Advanced Directives 06/09/2017  Does Patient Have a Medical Advance Directive? Yes  Type of Paramedic of [place];Living will;Out of facility DNR (pink MOST or yellow form)  Does patient want to make changes to medical advance directive? No - Patient declined  Copy of [place] in Chart? Yes  Pre-existing out of facility DNR order (yellow form or pink MOST form) Yellow form placed in chart (order not valid for inpatient use);Pink MOST form placed in chart (order not valid for inpatient use)     Chief Complaint  Patient presents with  . Medical Management of Chronic Issues    F/u - urinary incontinence,     HPI: Patient is a 84 y.o. female seen today for medical management of chronic diseases.   The patient hs history of T2DM, diet controlled, no longer able to check CBG at home on her own, she managed her diet, last Hgb 5.8  05/05/17. History of elevated cholesterol 217, LDL 140 05/05/17, taking fish oil 2gm daily. Her blood pressure is controlled on Lisinopril 33m qd, urinary frequency is managed with Myrbetriq, nocturnal urinary frequency average is 3-5x/night, occasional urinary leakage.    Past Medical History:  Diagnosis Date  . Allergy   . Arthritis   . Diabetes mellitus   . Hernia, abdominal   . Hyperlipidemia   . Hypertension   . Osteopenia, senile 09/26/2012   T score - 1.6 Left femur    Past Surgical History:  Procedure Laterality Date  . [place]CTOMY    [place]    . GUM SURGERY    . KNEE SURGERY      No Known Allergies  Allergies as of 07/20/2017   No Known Allergies     Medication List        Accurate as of 07/20/17 11:59 PM. Always use your most recent med list.          blood glucose meter kit and supplies Kit Pt would prefer one touch ultra.  History of  controlled DM, checks glucose once daily. Deliver please   Calcium Carbonate-Vitamin D3 600-400 MG-UNIT Tabs Commonly known as:  CALCIUM 600-D Take 1 tablet 2 (two) times daily by mouth.   fish oil-omega-3 fatty acids 1000 MG capsule Take 2 g by mouth daily.   glucose blood test strip Test blood sugar daily. Dx code: E11.8   lisinopril 10 MG tablet Commonly known as:  PRINIVIL,ZESTRIL TAKE 1 TABLET BY MOUTH DAILY.   mirabegron ER 50 MG Tb24 tablet Commonly known as:  MYRBETRIQ Take 1 tablet (50 mg total) by mouth daily.   VITAMIN D PO Take 2,000 Units by mouth daily.       Review of Systems:  Review of Systems  Constitutional: Negative for activity change, appetite change, chills, diaphoresis, fatigue and fever.  HENT: Positive for hearing loss. Negative for congestion, trouble swallowing and voice change.   Eyes: Negative for visual disturbance.  Respiratory: Negative for cough, choking, chest tightness, shortness of breath and wheezing.   Cardiovascular: Negative for chest pain, palpitations and leg swelling.  Gastrointestinal: Negative for abdominal distention, abdominal pain, constipation, diarrhea, nausea and vomiting.  Endocrine: Negative for cold intolerance.  Genitourinary: Positive for frequency. Negative for difficulty urinating, dysuria and urgency.  3-5x/night, occasional urinary leakage.   Musculoskeletal: Negative for back pain and gait problem.  Skin: Positive for rash. Negative for color change and pallor.       Lower leg scattered itching spots, seeing dermatology.   Neurological: Negative for tremors, speech difficulty, weakness and headaches.       C/o memory recall difficulties.   Psychiatric/Behavioral: Positive for sleep disturbance. Negative for agitation, behavioral problems, confusion and hallucinations. The patient is not nervous/anxious.        Not sleep/rest well related to nocturnal urinary frequency.     Health Maintenance  Topic Date  Due  . FOOT EXAM  02/16/2016  . INFLUENZA VACCINE  02/08/2017  . HEMOGLOBIN A1C  10/31/2017  . OPHTHALMOLOGY EXAM  04/10/2018  . TETANUS/TDAP  12/15/2021  . DEXA SCAN  Completed  . PNA vac Low Risk Adult  Completed    Physical Exam: Vitals:   07/20/17 1458  BP: 124/62  Pulse: 88  Resp: 20  Temp: (!) 97.5 F (36.4 C)  SpO2: 96%  Weight: 161 lb 12.8 oz (73.4 kg)  Height: '5\' 3"'  (1.6 m)   Body mass index is 28.66 kg/m. Physical Exam  Constitutional: She is oriented to person, place, and time. She appears well-developed and well-nourished. No distress.  HENT:  Head: Normocephalic and atraumatic.  Eyes: Conjunctivae and EOM are normal. Pupils are equal, round, and reactive to light.  Neck: Normal range of motion. Neck supple. No JVD present. No thyromegaly present.  Cardiovascular: Normal rate, regular rhythm and normal heart sounds.  No murmur heard. Pulmonary/Chest: Effort normal and breath sounds normal. She has no wheezes. She has no rales.  Abdominal: Soft. Bowel sounds are normal. She exhibits no distension. There is no tenderness.  Musculoskeletal: Normal range of motion. She exhibits no edema or tenderness.  Neurological: She is alert and oriented to person, place, and time. She has normal reflexes. She exhibits abnormal muscle tone. Coordination normal.  Skin: Skin is warm and dry. Rash noted. She is not diaphoretic. No erythema.  Lower leg, near her ankles, left knee, scattered itching spots, not new, under dermatology care  Psychiatric: She has a normal mood and affect. Her behavior is normal. Judgment and thought content normal.    Labs reviewed: Basic Metabolic Panel: Recent Labs    05/02/17 0730 05/25/17 0705  NA 137 138  K 4.4 4.6  CL 104 103  CO2 23 27  GLUCOSE 129* 134*  BUN 25 21  CREATININE 0.92* 0.85  CALCIUM 9.6 9.8  TSH 2.47  --    Liver Function Tests: Recent Labs    05/02/17 0730  AST 14  ALT 12  BILITOT 0.6  PROT 7.1   No results  for input(s): LIPASE, AMYLASE in the last 8760 hours. No results for input(s): AMMONIA in the last 8760 hours. CBC: Recent Labs    05/02/17 0730  WBC 7.6  NEUTROABS 3,359  HGB 13.9  HCT 40.7  MCV 90.2  PLT 330   Lipid Panel: Recent Labs    05/02/17 0730  CHOL 217*  HDL 47*  TRIG 164*  CHOLHDL 4.6   Lab Results  Component Value Date   HGBA1C 5.8 (H) 05/02/2017    Procedures since last visit: No results found.  Assessment/Plan  Memory loss 05/2017 MMSE 27/30  Hypertension Her blood pressure is controlled, continue Lisinopril 20m qd, update CMP  Diabetes mellitus type 2, controlled ([place] The patient hs history of T2DM, diet controlled, no longer able to check  CBG at home on her own, she managed her diet, last Hgb 5.8  05/05/17. Update Hgb a1c, CBC prior to the next appointment.   Hyperlipidemia associated with type 2 diabetes mellitus ([place]) History of elevated cholesterol 217, LDL 140 05/05/17, continue fish oil 2gm daily. Update lipid panel prior to next visit.   Incontinent of urine urinary frequency, occasional urinary leakage is managed with Myrbetriq, nocturnal urinary frequency average is 3-5x/nigh   Labs/tests ordered:  CBC CMP Hgb a1c lipid panel in 4 months  Next appt: f/u 4 months  Time spend 25 minutes.

## 2017-07-20 NOTE — Assessment & Plan Note (Signed)
Her blood pressure is controlled, continue Lisinopril 10mg  qd, update CMP

## 2017-07-20 NOTE — Patient Instructions (Signed)
CBC CMP Hgb a1c lipid panel in 4 months  Next appt: f/u 4 months

## 2017-07-20 NOTE — Assessment & Plan Note (Addendum)
05/2017 MMSE 27/30

## 2017-08-04 ENCOUNTER — Other Ambulatory Visit: Payer: Self-pay | Admitting: Internal Medicine

## 2017-08-23 DIAGNOSIS — L308 Other specified dermatitis: Secondary | ICD-10-CM | POA: Diagnosis not present

## 2017-08-25 ENCOUNTER — Encounter: Payer: Self-pay | Admitting: Internal Medicine

## 2017-11-15 DIAGNOSIS — I270 Primary pulmonary hypertension: Secondary | ICD-10-CM | POA: Diagnosis not present

## 2017-11-15 DIAGNOSIS — E7800 Pure hypercholesterolemia, unspecified: Secondary | ICD-10-CM | POA: Diagnosis not present

## 2017-11-15 DIAGNOSIS — E119 Type 2 diabetes mellitus without complications: Secondary | ICD-10-CM | POA: Diagnosis not present

## 2017-11-16 DIAGNOSIS — E7800 Pure hypercholesterolemia, unspecified: Secondary | ICD-10-CM

## 2017-11-16 DIAGNOSIS — I270 Primary pulmonary hypertension: Secondary | ICD-10-CM

## 2017-11-16 DIAGNOSIS — E119 Type 2 diabetes mellitus without complications: Secondary | ICD-10-CM

## 2017-11-17 LAB — COMPREHENSIVE METABOLIC PANEL
AG Ratio: 1.5 (calc) (ref 1.0–2.5)
ALT: 11 U/L (ref 6–29)
AST: 15 U/L (ref 10–35)
Albumin: 4.4 g/dL (ref 3.6–5.1)
Alkaline phosphatase (APISO): 45 U/L (ref 33–130)
BUN/Creatinine Ratio: 25 (calc) — ABNORMAL HIGH (ref 6–22)
BUN: 23 mg/dL (ref 7–25)
CO2: 26 mmol/L (ref 20–32)
Calcium: 9.7 mg/dL (ref 8.6–10.4)
Chloride: 102 mmol/L (ref 98–110)
Creat: 0.91 mg/dL — ABNORMAL HIGH (ref 0.60–0.88)
Globulin: 2.9 g/dL (calc) (ref 1.9–3.7)
Glucose, Bld: 147 mg/dL — ABNORMAL HIGH (ref 65–99)
Potassium: 4.5 mmol/L (ref 3.5–5.3)
Sodium: 137 mmol/L (ref 135–146)
Total Bilirubin: 0.8 mg/dL (ref 0.2–1.2)
Total Protein: 7.3 g/dL (ref 6.1–8.1)

## 2017-11-17 LAB — CBC
HCT: 41.8 % (ref 35.0–45.0)
Hemoglobin: 14.3 g/dL (ref 11.7–15.5)
MCH: 30.7 pg (ref 27.0–33.0)
MCHC: 34.2 g/dL (ref 32.0–36.0)
MCV: 89.7 fL (ref 80.0–100.0)
MPV: 10.3 fL (ref 7.5–12.5)
Platelets: 315 10*3/uL (ref 140–400)
RBC: 4.66 10*6/uL (ref 3.80–5.10)
RDW: 13.7 % (ref 11.0–15.0)
WBC: 8.9 10*3/uL (ref 3.8–10.8)

## 2017-11-17 LAB — LIPID PANEL
Cholesterol: 278 mg/dL — ABNORMAL HIGH (ref <200)
HDL: 46 mg/dL — ABNORMAL LOW (ref >50)
LDL Cholesterol (Calc): 186 mg/dL (calc) — ABNORMAL HIGH
Non-HDL Cholesterol (Calc): 232 mg/dL (calc) — ABNORMAL HIGH (ref <130)
Total CHOL/HDL Ratio: 6 (calc) — ABNORMAL HIGH (ref <5.0)
Triglycerides: 257 mg/dL — ABNORMAL HIGH (ref <150)

## 2017-11-17 LAB — HEMOGLOBIN A1C
Hgb A1c MFr Bld: 6.6 % of total Hgb — ABNORMAL HIGH (ref <5.7)
Mean Plasma Glucose: 143 (calc)
eAG (mmol/L): 7.9 (calc)

## 2017-11-20 ENCOUNTER — Other Ambulatory Visit: Payer: Self-pay | Admitting: *Deleted

## 2017-11-20 MED ORDER — METFORMIN HCL 500 MG PO TABS: 500.0000 mg | ORAL_TABLET | Freq: Two times a day (BID) | ORAL | 2 refills | Status: DC

## 2017-11-20 MED ORDER — ATORVASTATIN CALCIUM 20 MG PO TABS: 20.0000 mg | ORAL_TABLET | Freq: Every day | ORAL | 2 refills | Status: DC

## 2017-11-21 ENCOUNTER — Encounter: Payer: Self-pay | Admitting: Internal Medicine

## 2017-11-28 ENCOUNTER — Encounter: Payer: Medicare Other | Admitting: Internal Medicine

## 2017-11-28 DIAGNOSIS — M1712 Unilateral primary osteoarthritis, left knee: Secondary | ICD-10-CM | POA: Diagnosis not present

## 2017-11-28 DIAGNOSIS — M25562 Pain in left knee: Secondary | ICD-10-CM | POA: Diagnosis not present

## 2017-12-05 ENCOUNTER — Non-Acute Institutional Stay: Payer: Medicare Other | Admitting: Internal Medicine

## 2017-12-05 ENCOUNTER — Encounter: Payer: Self-pay | Admitting: Internal Medicine

## 2017-12-05 VITALS — BP 122/60 | HR 80 | Temp 98.3°F | Resp 16 | Ht 63.0 in | Wt 166.6 lb

## 2017-12-05 DIAGNOSIS — R682 Dry mouth, unspecified: Secondary | ICD-10-CM | POA: Diagnosis not present

## 2017-12-05 DIAGNOSIS — E785 Hyperlipidemia, unspecified: Secondary | ICD-10-CM | POA: Diagnosis not present

## 2017-12-05 DIAGNOSIS — M1712 Unilateral primary osteoarthritis, left knee: Secondary | ICD-10-CM | POA: Diagnosis not present

## 2017-12-05 DIAGNOSIS — N3281 Overactive bladder: Secondary | ICD-10-CM

## 2017-12-05 DIAGNOSIS — I10 Essential (primary) hypertension: Secondary | ICD-10-CM | POA: Diagnosis not present

## 2017-12-05 DIAGNOSIS — E1121 Type 2 diabetes mellitus with diabetic nephropathy: Secondary | ICD-10-CM | POA: Diagnosis not present

## 2017-12-05 DIAGNOSIS — E1169 Type 2 diabetes mellitus with other specified complication: Secondary | ICD-10-CM | POA: Diagnosis not present

## 2017-12-05 MED ORDER — ATORVASTATIN CALCIUM 10 MG PO TABS
10.0000 mg | ORAL_TABLET | Freq: Every day | ORAL | 3 refills | Status: DC
Start: 2017-12-05 — End: 2019-01-02

## 2017-12-05 MED ORDER — BIOTENE MOISTURIZING MOUTH MT SOLN: 5.0000 mL | Freq: Three times a day (TID) | OROMUCOSAL | 3 refills | Status: DC | PRN

## 2017-12-05 MED ORDER — MIRABEGRON ER 50 MG PO TB24: 50.0000 mg | ORAL_TABLET | Freq: Every day | ORAL | 2 refills | Status: DC

## 2017-12-05 NOTE — Progress Notes (Signed)
[facility]  Provider: Blanchie Serve MD   Location:  [facility] of Service:  Clinic (12)  PCP: Blanchie Serve, MD Patient Care Team: Blanchie Serve, MD as PCP - General (Internal Medicine)  Extended Emergency Contact Information Primary Emergency Contact: Joyce Copa [place] of [place] Phone: [phone] Relation: Niece  Code Status: DNR  Goals of Care: Advanced Directive information Advanced Directives 06/09/2017  Does Patient Have a Medical Advance Directive? Yes  Type of Paramedic of [place];Living will;Out of facility DNR (pink MOST or yellow form)  Does patient want to make changes to medical advance directive? No - Patient declined  Copy of [place] in Chart? Yes  Pre-existing out of facility DNR order (yellow form or pink MOST form) Yellow form placed in chart (order not valid for inpatient use);Pink MOST form placed in chart (order not valid for inpatient use)      Chief Complaint  Patient presents with  . Medical Management of Chronic Issues    4 month follow up. Patient stated that she has been having some dry mouth occassionally and she stated that she has been dealing with some urinary frequency.   . Medication Refill    No refills needed at this time    HPI: Patient is a 84 y.o. female seen today for routine visit. She has concerns about dry mouth and urinary frequency at night. This interrupts her sleep. She gets tired easily. She is being followed by Dr Veverly Fells for left knee OA, recently received steroid injection and is awaiting walker to help with ambulation.   Hyperlipidemia- recently started on atorvastatin on atorvastatin 20 mg daily and also takes fish oil. Denies myalgia  Knee OA- takes calcium and vitamin d. S/p steroid injection to left knee. Does water aerobics 3 times a week.   Hypertension- takes lisinopril 10 mg daily, tolerating well.    Diabetes mellitus- currently on metformin 500 mg daily for 2 weeks, had stopped metformin before.   OAB- currently on mirabegron 50 mg daily, has nocturia.  Past Medical History:  Diagnosis Date  . Allergy   . Arthritis   . Diabetes mellitus   . Hernia, abdominal   . Hyperlipidemia   . Hypertension   . Osteopenia, senile 09/26/2012   T score - 1.6 Left femur   Past Surgical History:  Procedure Laterality Date  . APPENDECTOMY    . [place]  . EYE SURGERY    . GUM SURGERY    . KNEE SURGERY      reports that she has never smoked. She has never used smokeless tobacco. She reports that she does not drink alcohol or use drugs. Social History   Socioeconomic History  . Marital status: Single    Spouse name: Not on file  . Number of children: Not on file  . Years of education: Not on file  . Highest education level: Not on file  Occupational History  . Not on file  Social Needs  . Financial resource strain: Not hard at all  . Food insecurity:    Worry: Never true    Inability: Never true  . Transportation needs:    Medical: No    Non-medical: No  Tobacco Use  . Smoking status: Never Smoker  . Smokeless tobacco: Never Used  Substance and Sexual Activity  . Alcohol use: No  . Drug use: No  . Sexual  activity: Not on file  Lifestyle  . Physical activity:    Days per week: 0 days    Minutes per session: 0 min  . Stress: Only a little  Relationships  . Social connections:    Talks on phone: More than three times a week    Gets together: More than three times a week    Attends religious service: 1 to 4 times per year    Active member of club or organization: Yes    Attends meetings of clubs or organizations: Never    Relationship status: Never married  . Intimate partner violence:    Fear of current or ex partner: No    Emotionally abused: No    Physically abused: No    Forced sexual activity: No  Other Topics Concern  . Not on file  Social  History Narrative   Tobacco use, amount per day now: None      Past tobacco use, amount per day: None      How many years did you use tobacco: None      Alcohol use (drinks per week): None      Diet: Regular      Do you drink/eat things with caffeine? Sweet tea      Marital status: Single            What year were you married?      Do you live in a house, apartment, assisted living, condo, trailer? Apartment      Is it one or more stories? 1      How many persons live in your home? 1      Do you have any pets in your home? 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 Yes             How often? Water exercise 5 days a week.        Do you have a living will? Yes      Do you have a DNR form? No            If not, do you want to discuss one? Yes      Do you have signed POA/HPOA forms?  Yes             Functional Status Survey:    Family History  Problem Relation Age of Onset  . Stroke Mother   . Heart disease Father     Health Maintenance  Topic Date Due  . FOOT EXAM  02/16/2016  . INFLUENZA VACCINE  02/08/2018  . OPHTHALMOLOGY EXAM  04/10/2018  . HEMOGLOBIN A1C  05/18/2018  . TETANUS/TDAP  12/15/2021  . DEXA SCAN  Completed  . PNA vac Low Risk Adult  Completed    No Known Allergies  Outpatient Encounter Medications as of 12/05/2017  Medication Sig  . atorvastatin (LIPITOR) 20 MG tablet Take 1 tablet (20 mg total) by mouth daily.  . Calcium Carbonate-Vitamin D3 600-400 MG-UNIT TABS TAKE 1 TABLET BY MOUTH TWICE DAILY.  Marland Kitchen lisinopril (PRINIVIL,ZESTRIL) 10 MG tablet TAKE 1 TABLET BY MOUTH DAILY.  . metFORMIN (GLUCOPHAGE) 500 MG tablet Take 1 tablet (500 mg total) by mouth 2 (two) times daily with a meal.  . Zinc Oxide (DESITIN MAXIMUM STRENGTH EX) Apply 1 application topically at bedtime. To the groin area to help with itching.  . blood glucose meter kit and supplies KIT Pt would prefer one touch  ultra.  History of controlled DM, checks  glucose once daily. Deliver please (Patient not taking: Reported on 07/20/2017)  . fish oil-omega-3 fatty acids 1000 MG capsule Take 2 g by mouth daily.  Marland Kitchen glucose blood test strip Test blood sugar daily. Dx code: E11.8 (Patient not taking: Reported on 07/20/2017)  . mirabegron ER (MYRBETRIQ) 50 MG TB24 tablet Take 1 tablet (50 mg total) by mouth daily. (Patient not taking: Reported on 12/05/2017)  . [DISCONTINUED] Cholecalciferol (VITAMIN D PO) Take 2,000 Units by mouth daily.   No facility-administered encounter medications on file as of 12/05/2017.     Review of Systems  Constitutional: Positive for fatigue. Negative for appetite change, chills and fever.  HENT: Positive for postnasal drip. Negative for congestion, mouth sores, sinus pressure, sinus pain, sore throat and trouble swallowing.   Respiratory: Negative for cough, choking and shortness of breath.   Cardiovascular: Negative for chest pain and palpitations.  Gastrointestinal: Negative for abdominal pain, constipation, nausea and vomiting.  Genitourinary: Positive for frequency. Negative for dysuria and hematuria.       Has to change1-3 pads a day. Occasional leakage  Musculoskeletal: Positive for arthralgias and gait problem.       No fall reported.   Skin: Negative for rash and wound.  Neurological: Negative for dizziness and headaches.  Psychiatric/Behavioral: Negative for behavioral problems.    Vitals:   12/05/17 0917  BP: 122/60  Pulse: 80  Resp: 16  Temp: 98.3 F (36.8 C)  TempSrc: Oral  SpO2: 98%  Weight: 166 lb 9.6 oz (75.6 kg)  Height: _0  (1.6 m)   Body mass index is 29.51 kg/m.   Wt Readings from Last 3 Encounters:  12/05/17 166 lb 9.6 oz (75.6 kg)  07/20/17 161 lb 12.8 oz (73.4 kg)  06/09/17 162 lb (73.5 kg)   Physical Exam  Constitutional: She is oriented to person, place, and time. No distress.  Overweight, elderly female   HENT:  Head: Normocephalic and atraumatic.  Mouth/Throat: Oropharynx is  clear and moist.  Eyes: Pupils are equal, round, and reactive to light. Conjunctivae and EOM are normal. Right eye exhibits no discharge. Left eye exhibits no discharge.  Neck: Normal range of motion. Neck supple.  Cardiovascular: Normal rate, regular rhythm and intact distal pulses.  Pulmonary/Chest: Effort normal and breath sounds normal. No respiratory distress. She has no wheezes. She has no rales.  Abdominal: Soft. Bowel sounds are normal. There is no tenderness.  Umbilical hernia  Musculoskeletal: Normal range of motion. She exhibits edema. She exhibits no tenderness.  Lymphadenopathy:    She has no cervical adenopathy.  Neurological: She is alert and oriented to person, place, and time.  Skin: Skin is warm and dry. She is not diaphoretic.  Psychiatric: She has a normal mood and affect.    Labs reviewed: Basic Metabolic Panel: Recent Labs    05/02/17 0730 05/25/17 0705 11/15/17 0000  NA 137 138 137  K 4.4 4.6 4.5  CL 104 103 102  CO2 _1 GLUCOSE 129* 134* 147*  BUN _2 CREATININE 0.92* 0.85 0.91*  CALCIUM 9.6 9.8 9.7   Liver Function Tests: Recent Labs    05/02/17 0730 11/15/17 0000  AST 14 15  ALT 12 11  BILITOT 0.6 0.8  PROT 7.1 7.3   No results for input(s): LIPASE, AMYLASE in the last 8760 hours. No results for input(s): AMMONIA in the last 8760 hours. CBC: Recent Labs    05/02/17 0730  11/15/17 0000  WBC 7.6 8.9  NEUTROABS 3,359  --   HGB 13.9 14.3  HCT 40.7 41.8  MCV 90.2 89.7  PLT 330 315   Cardiac Enzymes: No results for input(s): CKTOTAL, CKMB, CKMBINDEX, TROPONINI in the last 8760 hours. BNP: Invalid input(s): POCBNP Lab Results  Component Value Date   HGBA1C 6.6 (H) 11/15/2017   Lab Results  Component Value Date   TSH 2.47 05/02/2017   Lab Results  Component Value Date   VITAMINB12 401 12/06/2013   No results found for: FOLATE No results found for: IRON, TIBC, FERRITIN  Lipid Panel: Recent Labs    05/02/17 0730  11/15/17 0000  CHOL 217* 278*  HDL 47* 46*  LDLCALC 140* 186*  TRIG 164* 257*  CHOLHDL 4.6 6.0*   Lab Results  Component Value Date   HGBA1C 6.6 (H) 11/15/2017    Procedures since last visit: No results found.  Assessment/Plan  1. OAB (overactive bladder) Start taking mirabegron - mirabegron ER (MYRBETRIQ) 50 MG TB24 tablet; Take 1 tablet (50 mg total) by mouth daily.  Dispense: 90 tablet; Refill: 2  2. Essential hypertension Controlled, continue lisinopril  3. Hyperlipidemia associated with type 2 diabetes mellitus (HCC) Decrease atorvastatin to 10 mg daily given her age and lipid profile with her being statin naive.  - CMP with eGFR([facility]); Future - Lipid Panel; Future - Hemoglobin A1c; Future  4. Controlled type 2 diabetes mellitus with diabetic nephropathy, without long-term current use of insulin (HCC) C/w metformin 500 mg daily, reviewed a1c. Fall precautions.  - Hemoglobin A1c; Future - Microalbumin/Creatinine Ratio, Urine; Future  5. Primary osteoarthritis of left knee Continue calcium and vit d supplement. Fall precautions, pending her walker  6. Dry mouth Her poor sugar control and her medications could be contributing to this.  - Artificial Saliva (BIOTENE MOISTURIZING MOUTH) SOLN; Use as directed 5 sprays in the mouth or throat 3 (three) times daily as needed.  Dispense: 1 Bottle; Refill: 3    Labs/tests ordered:   Lab Orders     CMP with eGFR([facility])     Lipid Panel     Hemoglobin A1c     Microalbumin/Creatinine Ratio, Urine   Next appointment: 3 months  Communication: reviewed care plan with patient    Blanchie Serve, MD Internal Medicine [place], [place] [address] Cell Phone (Monday-Friday 8 am - 5 pm): [phone] On Call: [phone] and follow prompts after 5 pm and on weekends Office Phone: [phone] Office Fax: [phone]

## 2017-12-05 NOTE — Patient Instructions (Signed)
  Take metformin 500 mg daily for your blood sugar.  Take atorvastatin 10 mg daily for your cholesterol.  Take myrbetriq 50 mg daily for your overactive bladder and urinary frequency.  Watch your sugar and fried food intake.

## 2018-01-01 ENCOUNTER — Telehealth: Payer: Self-pay | Admitting: *Deleted

## 2018-01-01 NOTE — Telephone Encounter (Signed)
Patient called and stated that her insurance will no longer pay for Myrbetriq. Patient not sure what to do and want to speak with Dr. Aretha Parrot.  [phone]

## 2018-01-02 ENCOUNTER — Other Ambulatory Visit: Payer: Self-pay

## 2018-01-02 NOTE — Telephone Encounter (Signed)
Spoke with the patient to let her know that we are working to see what her insurance will cover. Catherine Ellis was happy to hear that we are trying to find a solution.

## 2018-02-05 ENCOUNTER — Other Ambulatory Visit: Payer: Self-pay | Admitting: Internal Medicine

## 2018-02-21 ENCOUNTER — Encounter: Payer: Self-pay | Admitting: Internal Medicine

## 2018-02-26 DIAGNOSIS — E1169 Type 2 diabetes mellitus with other specified complication: Secondary | ICD-10-CM | POA: Diagnosis not present

## 2018-02-26 DIAGNOSIS — E785 Hyperlipidemia, unspecified: Secondary | ICD-10-CM | POA: Diagnosis not present

## 2018-02-26 DIAGNOSIS — E1121 Type 2 diabetes mellitus with diabetic nephropathy: Secondary | ICD-10-CM | POA: Diagnosis not present

## 2018-02-27 DIAGNOSIS — E1121 Type 2 diabetes mellitus with diabetic nephropathy: Secondary | ICD-10-CM

## 2018-02-27 DIAGNOSIS — E785 Hyperlipidemia, unspecified: Secondary | ICD-10-CM

## 2018-02-27 DIAGNOSIS — E1169 Type 2 diabetes mellitus with other specified complication: Secondary | ICD-10-CM

## 2018-03-01 LAB — COMPLETE METABOLIC PANEL WITH GFR
AG Ratio: 1.7 (calc) (ref 1.0–2.5)
ALT: 36 U/L — ABNORMAL HIGH (ref 6–29)
AST: 24 U/L (ref 10–35)
Albumin: 4.3 g/dL (ref 3.6–5.1)
Alkaline phosphatase (APISO): 53 U/L (ref 33–130)
BUN: 23 mg/dL (ref 7–25)
CO2: 24 mmol/L (ref 20–32)
Calcium: 9.9 mg/dL (ref 8.6–10.4)
Chloride: 104 mmol/L (ref 98–110)
Creat: 0.76 mg/dL (ref 0.60–0.88)
GFR, Est African American: 78 mL/min/{1.73_m2} (ref >=60)
GFR, Est Non African American: 67 mL/min/{1.73_m2} (ref >=60)
Globulin: 2.6 g/dL (calc) (ref 1.9–3.7)
Glucose, Bld: 165 mg/dL — ABNORMAL HIGH (ref 65–99)
Potassium: 4.3 mmol/L (ref 3.5–5.3)
Sodium: 137 mmol/L (ref 135–146)
Total Bilirubin: 0.5 mg/dL (ref 0.2–1.2)
Total Protein: 6.9 g/dL (ref 6.1–8.1)

## 2018-03-01 LAB — LIPID PANEL
Cholesterol: 186 mg/dL (ref <200)
HDL: 48 mg/dL — ABNORMAL LOW (ref >50)
LDL Cholesterol (Calc): 105 mg/dL (calc) — ABNORMAL HIGH
Non-HDL Cholesterol (Calc): 138 mg/dL (calc) — ABNORMAL HIGH (ref <130)
Total CHOL/HDL Ratio: 3.9 (calc) (ref <5.0)
Triglycerides: 210 mg/dL — ABNORMAL HIGH (ref <150)

## 2018-03-01 LAB — HEMOGLOBIN A1C
Hgb A1c MFr Bld: 6.7 % of total Hgb — ABNORMAL HIGH (ref <5.7)
Mean Plasma Glucose: 146 (calc)
eAG (mmol/L): 8.1 (calc)

## 2018-03-01 LAB — MICROALBUMIN / CREATININE URINE RATIO

## 2018-03-06 ENCOUNTER — Encounter: Payer: Self-pay | Admitting: Internal Medicine

## 2018-03-06 ENCOUNTER — Non-Acute Institutional Stay: Payer: Medicare Other | Admitting: Internal Medicine

## 2018-03-06 VITALS — BP 126/70 | HR 84 | Temp 98.3°F | Resp 18 | Ht 63.0 in | Wt 164.0 lb

## 2018-03-06 DIAGNOSIS — E1121 Type 2 diabetes mellitus with diabetic nephropathy: Secondary | ICD-10-CM

## 2018-03-06 DIAGNOSIS — M8580 Other specified disorders of bone density and structure, unspecified site: Secondary | ICD-10-CM

## 2018-03-06 DIAGNOSIS — N3942 Incontinence without sensory awareness: Secondary | ICD-10-CM

## 2018-03-06 DIAGNOSIS — E1169 Type 2 diabetes mellitus with other specified complication: Secondary | ICD-10-CM | POA: Diagnosis not present

## 2018-03-06 DIAGNOSIS — E663 Overweight: Secondary | ICD-10-CM | POA: Diagnosis not present

## 2018-03-06 DIAGNOSIS — E785 Hyperlipidemia, unspecified: Secondary | ICD-10-CM

## 2018-03-06 DIAGNOSIS — I10 Essential (primary) hypertension: Secondary | ICD-10-CM | POA: Diagnosis not present

## 2018-03-06 DIAGNOSIS — G3184 Mild cognitive impairment, so stated: Secondary | ICD-10-CM

## 2018-03-06 MED ORDER — CALCIUM CARBONATE-VITAMIN D3 600-400 MG-UNIT PO TABS: 1.0000 | ORAL_TABLET | Freq: Two times a day (BID) | ORAL | 1 refills | Status: DC

## 2018-03-06 NOTE — Progress Notes (Signed)
Location:  [facility] (12) Provider:  Blanchie Serve MD  Blanchie Serve, MD  Patient Care Team: Blanchie Serve, MD as PCP - General (Internal Medicine)  Extended Emergency Contact Information Primary Emergency Contact: Joyce Copa [place] of [place] Phone: [phone] Relation: Niece  Code Status:  DNR  Goals of care: Advanced Directive information Advanced Directives 06/09/2017  Does Patient Have a Medical Advance Directive? Yes  Type of Paramedic of [place];Living will;Out of facility DNR (pink MOST or yellow form)  Does patient want to make changes to medical advance directive? No - Patient declined  Copy of [place] in Chart? Yes  Pre-existing out of facility DNR order (yellow form or pink MOST form) Yellow form placed in chart (order not valid for inpatient use);Pink MOST form placed in chart (order not valid for inpatient use)     Chief Complaint  Patient presents with  . Medical Management of Chronic Issues    3 month follow up. Patient mentioned that she has been having increased urinary frequency that keeps her up at night.   . Medication Refill    Calcium (pending)  . Results    Discuss labs   . MMSE    27/30: passed clock    HPI:  Pt is a 84 y.o. female seen today for medical management of chronic diseases.    Urinary frequency- 4-7 times wakes up at night to urinate  Dry mouth- takes artificial saliva, helpful  Type 2 diabetes- takes metformin 500 mg daily. Reviewed a1c. Appetite is good. Denies polydypsia,  Hypertension- takes lisinopril 10 mg daily, denies headache, chest pain, dyspnea  Hyperlipidemia- takes atorvastatin 10 mg daily, tolerating well, denies myalgia  OAB- was taking myrbetriq but has stopped taking it due to cost issue.    Past Medical History:  Diagnosis Date  . Allergy   . Arthritis   . Diabetes mellitus   . Hernia, abdominal     . Hyperlipidemia   . Hypertension   . Osteopenia, senile 09/26/2012   T score - 1.6 Left femur   Past Surgical History:  Procedure Laterality Date  . APPENDECTOMY    . [place]  . EYE SURGERY    . GUM SURGERY    . KNEE SURGERY      No Known Allergies  Outpatient Encounter Medications as of 03/06/2018  Medication Sig  . Artificial Saliva (BIOTENE DRY MOUTH MOISTURIZING) SOLN Use as directed 5 sprays in the mouth or throat 2 (two) times daily as needed.  . Artificial Saliva (BIOTENE MOISTURIZING MOUTH) SOLN Use as directed 5 sprays in the mouth or throat 3 (three) times daily as needed.  Marland Kitchen atorvastatin (LIPITOR) 10 MG tablet Take 1 tablet (10 mg total) by mouth daily.  . fish oil-omega-3 fatty acids 1000 MG capsule Take 2 g by mouth daily.  Marland Kitchen lisinopril (PRINIVIL,ZESTRIL) 10 MG tablet TAKE 1 TABLET BY MOUTH DAILY.  . metFORMIN (GLUCOPHAGE) 500 MG tablet Take 500 mg by mouth daily.  . Zinc Oxide (DESITIN MAXIMUM STRENGTH EX) Apply 1 application topically at bedtime. To the groin area to help with itching.  . blood glucose meter kit and supplies KIT Pt would prefer one touch ultra.  History of controlled DM, checks glucose once daily. Deliver please (Patient not taking: Reported on 07/20/2017)  . Calcium Carbonate-Vitamin D3 600-400 MG-UNIT TABS TAKE 1 TABLET BY MOUTH TWICE DAILY. (Patient not  taking: Reported on 03/06/2018)  . glucose blood test strip Test blood sugar daily. Dx code: E11.8 (Patient not taking: Reported on 07/20/2017)  . mirabegron ER (MYRBETRIQ) 50 MG TB24 tablet Take 1 tablet (50 mg total) by mouth daily. (Patient not taking: Reported on 03/06/2018)   No facility-administered encounter medications on file as of 03/06/2018.     Review of Systems  Constitutional: Negative for appetite change, chills and fever.  HENT: Positive for hearing loss and postnasal drip. Negative for congestion, ear discharge, ear pain, mouth sores, rhinorrhea, sinus pressure, sinus  pain, sore throat and trouble swallowing.        Has dry mouth, denture has loosened and needs realignment  Eyes: Positive for visual disturbance. Negative for pain and discharge.       Has corrective glasses  Respiratory: Negative for cough, choking and shortness of breath.   Cardiovascular: Negative for chest pain, palpitations and leg swelling.  Gastrointestinal: Negative for abdominal pain, blood in stool, constipation, diarrhea, nausea and vomiting.       Moves her bowels every day  Genitourinary: Positive for frequency. Negative for dysuria and hematuria.       Increased urinary frequency at night 4- 7 times/ night,  urinary incontinence during daytime at times and wears pads  Musculoskeletal: Positive for arthralgias and gait problem. Negative for back pain, joint swelling and neck pain.       Seen by orthopedic and gets injection, has been using a walker for ambulation, no fall reported  Neurological: Negative for dizziness, numbness and headaches.  Psychiatric/Behavioral: Positive for decreased concentration. Negative for behavioral problems, confusion, dysphoric mood and suicidal ideas. The patient is nervous/anxious.     Immunization History  Administered Date(s) Administered  . Influenza Split 04/13/2012  . Influenza,inj,Quad PF,6+ Mos 06/14/2013, 04/14/2014, 04/27/2015  . Influenza-Unspecified 04/10/2016  . PPD Test 12/16/2011, 12/16/2011  . Pneumococcal Conjugate-13 07/14/2014  . Pneumococcal Polysaccharide-23 01/04/2010  . Tdap 12/16/2011   Pertinent  Health Maintenance Due  Topic Date Due  . FOOT EXAM  02/16/2016  . INFLUENZA VACCINE  02/08/2018  . OPHTHALMOLOGY EXAM  04/10/2018  . HEMOGLOBIN A1C  08/29/2018  . DEXA SCAN  Completed  . PNA vac Low Risk Adult  Completed   Fall Risk  06/09/2017 09/12/2016 08/03/2015 02/16/2015 07/14/2014  Falls in the past year? No No No Yes No  Comment - - - unsteady gait, did not "hurt self" -  Number falls in past yr: - - - - -  Injury  with Fall? - - - - -  Risk for fall due to : - - - - -  Risk for fall due to: Comment - - - - -   Functional Status Survey:    Vitals:   03/06/18 0822  BP: 126/70  Pulse: 84  Resp: 18  Temp: 98.3 F (36.8 C)  TempSrc: Oral  SpO2: 96%  Weight: 164 lb (74.4 kg)  Height: '5\' 3"'  (1.6 m)   Body mass index is 29.05 kg/m.   Wt Readings from Last 3 Encounters:  03/06/18 164 lb (74.4 kg)  12/05/17 166 lb 9.6 oz (75.6 kg)  07/20/17 161 lb 12.8 oz (73.4 kg)   Physical Exam  Constitutional: She is oriented to person, place, and time. No distress.  overweight  HENT:  Head: Normocephalic and atraumatic.  Right Ear: External ear normal.  Left Ear: External ear normal.  Nose: Nose normal.  Mouth/Throat: Oropharynx is clear and moist. No oropharyngeal exudate.  Upper partial  denture, left ear canal has some cerumen  Eyes: Pupils are equal, round, and reactive to light. Conjunctivae and EOM are normal. Right eye exhibits no discharge. Left eye exhibits no discharge.  Has corrective glasses  Neck: Normal range of motion. Neck supple.  Cardiovascular: Normal rate, regular rhythm and intact distal pulses.  Pulmonary/Chest: Effort normal and breath sounds normal. No respiratory distress. She has no wheezes. She has no rales.  Abdominal: Soft. Bowel sounds are normal. There is no tenderness. There is no guarding. A hernia is present.  Umbilical hernia  Musculoskeletal: Normal range of motion. She exhibits no edema.  Lymphadenopathy:    She has no cervical adenopathy.  Neurological: She is alert and oriented to person, place, and time. She exhibits normal muscle tone.  Skin: Skin is warm and dry. Capillary refill takes 2 to 3 seconds. She is not diaphoretic.  Psychiatric: She has a normal mood and affect.    Labs reviewed: Recent Labs    05/25/17 0705 11/15/17 0000 02/26/18 0000  NA 138 137 137  K 4.6 4.5 4.3  CL 103 102 104  CO2 '27 26 24  ' GLUCOSE 134* 147* 165*  BUN '21 23 23    ' CREATININE 0.85 0.91* 0.76  CALCIUM 9.8 9.7 9.9   Recent Labs    05/02/17 0730 11/15/17 0000 02/26/18 0000  AST '14 15 24  ' ALT 12 11 36*  BILITOT 0.6 0.8 0.5  PROT 7.1 7.3 6.9   Recent Labs    05/02/17 0730 11/15/17 0000  WBC 7.6 8.9  NEUTROABS 3,359  --   HGB 13.9 14.3  HCT 40.7 41.8  MCV 90.2 89.7  PLT 330 315   Lab Results  Component Value Date   TSH 2.47 05/02/2017   Lab Results  Component Value Date   HGBA1C 6.7 (H) 02/26/2018   Lab Results  Component Value Date   CHOL 186 02/26/2018   HDL 48 (L) 02/26/2018   LDLCALC 105 (H) 02/26/2018   LDLDIRECT 133 (H) 07/14/2014   TRIG 210 (H) 02/26/2018   CHOLHDL 3.9 02/26/2018    Significant Diagnostic Results in last 30 days:  No results found.  Assessment/Plan  1. Controlled type 2 diabetes mellitus with diabetic nephropathy, without long-term current use of insulin (HCC) Continue metformin and statin, reviewed lipid and a1c. Continue lisinopril.  - BMP with eGFR([facility]); Future - TSH; Future - Hemoglobin A1c; Future  2. Hyperlipidemia associated with type 2 diabetes mellitus (HCC) Continue atorvastatin 10 mg daily. Improved lipid panel.   3. Essential hypertension Continue lisinopril 10 mg daily. Reviewed renal function  4. Osteopenia, unspecified location Continue calcium and vitamin d supplement, walker for ambulation, fall precautions - Calcium Carbonate-Vitamin D3 600-400 MG-UNIT TABS; Take 1 tablet by mouth 2 (two) times daily.  Dispense: 60 tablet; Refill: 1  5. Urinary incontinence without sensory awareness D/c myrbetriq with cost issue. Perineal care.  6. Mild cognitive impairment Reviewed MMSE 27/30, passed clock draw, BP controlled, improved lipid panel. - TSH; Future  7. Overweight (BMI 25.0-29.9) Exercise for 30 minutes with walking as tolerated 5 days a week, counselled and encouraged pt, willing to try, calorie counting and cutting down on sweets and fried food encouraged - TSH;  Future - Hemoglobin A1c; Future   Family/ staff Communication: reviewed care plan with patient   Labs/tests ordered:   Lab Orders     BMP with eGFR([facility])     TSH     Hemoglobin A1c   Cebastian Neis, MD Internal Medicine  [facility] [place], [place] [address] Cell Phone (Monday-Friday 8 am - 5 pm): [phone] On Call: [phone] and follow prompts after 5 pm and on weekends Office Phone: [phone] Office Fax: [phone]

## 2018-03-07 ENCOUNTER — Other Ambulatory Visit: Payer: Self-pay | Admitting: *Deleted

## 2018-03-07 DIAGNOSIS — G3184 Mild cognitive impairment, so stated: Secondary | ICD-10-CM

## 2018-03-07 DIAGNOSIS — E1121 Type 2 diabetes mellitus with diabetic nephropathy: Secondary | ICD-10-CM

## 2018-03-07 DIAGNOSIS — E663 Overweight: Secondary | ICD-10-CM

## 2018-04-02 ENCOUNTER — Other Ambulatory Visit: Payer: Self-pay | Admitting: *Deleted

## 2018-04-02 DIAGNOSIS — E1121 Type 2 diabetes mellitus with diabetic nephropathy: Secondary | ICD-10-CM

## 2018-04-02 MED ORDER — BLOOD GLUCOSE MONITOR KIT: PACK | 0 refills | Status: DC

## 2018-04-04 ENCOUNTER — Telehealth: Payer: Self-pay | Admitting: *Deleted

## 2018-04-04 NOTE — Telephone Encounter (Signed)
Left message to inform patient that her glucose meter would be coming in the mail, and that she can call me if she has any questions.

## 2018-04-09 ENCOUNTER — Other Ambulatory Visit: Payer: Self-pay | Admitting: *Deleted

## 2018-04-09 MED ORDER — GLUCOSE BLOOD VI STRP: ORAL_STRIP | 11 refills | Status: DC

## 2018-04-09 MED ORDER — ACCU-CHEK FASTCLIX LANCETS MISC: 11 refills | Status: DC

## 2018-04-09 NOTE — Telephone Encounter (Signed)
[facility]

## 2018-04-12 DIAGNOSIS — Z23 Encounter for immunization: Secondary | ICD-10-CM | POA: Diagnosis not present

## 2018-04-13 ENCOUNTER — Telehealth: Payer: Self-pay

## 2018-04-13 NOTE — Telephone Encounter (Signed)
Catherine Ellis called the office concerned about her diabetic supplies. I asked her for her information and checked her chart and found these items had been ordered and should be sent to her shortly. I called patient back informed her of my findings.

## 2018-04-24 ENCOUNTER — Other Ambulatory Visit: Payer: Self-pay | Admitting: Nurse Practitioner

## 2018-04-24 NOTE — Telephone Encounter (Signed)
Called patient with questions regarding her Metformin dosage , she ask for a refill ( 1 tablet twice daily).

## 2018-05-07 ENCOUNTER — Other Ambulatory Visit: Payer: Self-pay | Admitting: *Deleted

## 2018-05-07 DIAGNOSIS — E1121 Type 2 diabetes mellitus with diabetic nephropathy: Secondary | ICD-10-CM

## 2018-05-07 MED ORDER — ALCOHOL PREP 70 % PADS: MEDICATED_PAD | 5 refills | Status: DC

## 2018-05-07 MED ORDER — SHARPS CONTAINER MISC: 1.0000 | 5 refills | Status: DC | PRN

## 2018-05-07 MED ORDER — GLUCOSE BLOOD VI STRP: ORAL_STRIP | 12 refills | Status: DC

## 2018-05-10 DIAGNOSIS — Z961 Presence of intraocular lens: Secondary | ICD-10-CM | POA: Diagnosis not present

## 2018-05-10 DIAGNOSIS — H52203 Unspecified astigmatism, bilateral: Secondary | ICD-10-CM | POA: Diagnosis not present

## 2018-05-10 DIAGNOSIS — H353131 Nonexudative age-related macular degeneration, bilateral, early dry stage: Secondary | ICD-10-CM | POA: Diagnosis not present

## 2018-05-10 DIAGNOSIS — E119 Type 2 diabetes mellitus without complications: Secondary | ICD-10-CM | POA: Diagnosis not present

## 2018-05-29 ENCOUNTER — Other Ambulatory Visit: Payer: Self-pay

## 2018-06-14 DIAGNOSIS — N3946 Mixed incontinence: Secondary | ICD-10-CM | POA: Diagnosis not present

## 2018-06-14 DIAGNOSIS — M6281 Muscle weakness (generalized): Secondary | ICD-10-CM | POA: Diagnosis not present

## 2018-06-14 DIAGNOSIS — R2681 Unsteadiness on feet: Secondary | ICD-10-CM | POA: Diagnosis not present

## 2018-06-15 DIAGNOSIS — M6281 Muscle weakness (generalized): Secondary | ICD-10-CM | POA: Diagnosis not present

## 2018-06-15 DIAGNOSIS — R2681 Unsteadiness on feet: Secondary | ICD-10-CM | POA: Diagnosis not present

## 2018-06-15 DIAGNOSIS — N3946 Mixed incontinence: Secondary | ICD-10-CM | POA: Diagnosis not present

## 2018-06-21 ENCOUNTER — Encounter: Payer: Medicare Other | Admitting: Nurse Practitioner

## 2018-06-27 DIAGNOSIS — G3184 Mild cognitive impairment, so stated: Secondary | ICD-10-CM | POA: Diagnosis not present

## 2018-06-27 DIAGNOSIS — E1121 Type 2 diabetes mellitus with diabetic nephropathy: Secondary | ICD-10-CM | POA: Diagnosis not present

## 2018-06-27 DIAGNOSIS — E663 Overweight: Secondary | ICD-10-CM | POA: Diagnosis not present

## 2018-06-28 DIAGNOSIS — G3184 Mild cognitive impairment, so stated: Secondary | ICD-10-CM

## 2018-06-28 DIAGNOSIS — E663 Overweight: Secondary | ICD-10-CM

## 2018-06-28 DIAGNOSIS — E1121 Type 2 diabetes mellitus with diabetic nephropathy: Secondary | ICD-10-CM

## 2018-06-29 LAB — HEMOGLOBIN A1C
Hgb A1c MFr Bld: 6.8 % of total Hgb — ABNORMAL HIGH (ref <5.7)
Mean Plasma Glucose: 148 (calc)
eAG (mmol/L): 8.2 (calc)

## 2018-06-29 LAB — BASIC METABOLIC PANEL
BUN: 17 mg/dL (ref 7–25)
CO2: 24 mmol/L (ref 20–32)
Calcium: 9.5 mg/dL (ref 8.6–10.4)
Chloride: 104 mmol/L (ref 98–110)
Creat: 0.83 mg/dL (ref 0.60–0.88)
Glucose, Bld: 149 mg/dL — ABNORMAL HIGH (ref 65–99)
Potassium: 4.2 mmol/L (ref 3.5–5.3)
Sodium: 139 mmol/L (ref 135–146)

## 2018-06-29 LAB — TSH: TSH: 2.09 mIU/L (ref 0.40–4.50)

## 2018-07-05 ENCOUNTER — Non-Acute Institutional Stay: Payer: Medicare Other | Admitting: Nurse Practitioner

## 2018-07-05 ENCOUNTER — Encounter: Payer: Self-pay | Admitting: Nurse Practitioner

## 2018-07-05 DIAGNOSIS — E538 Deficiency of other specified B group vitamins: Secondary | ICD-10-CM

## 2018-07-05 DIAGNOSIS — E1121 Type 2 diabetes mellitus with diabetic nephropathy: Secondary | ICD-10-CM | POA: Diagnosis not present

## 2018-07-05 DIAGNOSIS — E871 Hypo-osmolality and hyponatremia: Secondary | ICD-10-CM

## 2018-07-05 DIAGNOSIS — E1122 Type 2 diabetes mellitus with diabetic chronic kidney disease: Secondary | ICD-10-CM | POA: Diagnosis not present

## 2018-07-05 NOTE — Assessment & Plan Note (Signed)
06/27/18 Na 139, K 4.2, Bun 17, creat 0.83, a1c 6.8, TSH 2.09

## 2018-07-05 NOTE — Assessment & Plan Note (Signed)
Blood pressure is controlled, continue Metformin 500mg  bid.

## 2018-07-05 NOTE — Assessment & Plan Note (Signed)
Blood pressure is controlled, continue Lisinopril 10mg qd  

## 2018-07-05 NOTE — Progress Notes (Signed)
Location:   clinic [place]   Place of Service:  Clinic (12) Provider: Marlana Latus NP  Code Status: DNR Goals of Care: [place] Advanced Directives 06/09/2017  Does Patient Have a Medical Advance Directive? Yes  Type of Paramedic of [place];Living will;Out of facility DNR (pink MOST or yellow form)  Does patient want to make changes to medical advance directive? No - Patient declined  Copy of [place] in Chart? Yes  Pre-existing out of facility DNR order (yellow form or pink MOST form) Yellow form placed in chart (order not valid for inpatient use);Pink MOST form placed in chart (order not valid for inpatient use)     Chief Complaint  Patient presents with  . Medical Management of Chronic Issues    3 mo f/u    HPI: Patient is a 84 y.o. female seen today for medical management of chronic diseases.     The patient has history of T2DM, blood sugar is controlled, on Metformin 543m bid, last Hgb a1c. HTN, blood pressure is controlled on Lisinopril 145mqd. She uses artificial saliva prn for dry mouth. 06/27/18 Na 139, K 4.2, Bun 17, creat 0.83, a1c 6.8, TSH 2.09 Past Medical History:  Diagnosis Date  . Allergy   . Arthritis   . Diabetes mellitus   . Hernia, abdominal   . Hyperlipidemia   . Hypertension   . Osteopenia, senile 09/26/2012   T score - 1.6 Left femur    Past Surgical History:  Procedure Laterality Date  . APPENDECTOMY    . [place]. EYE SURGERY    . GUM SURGERY    . KNEE SURGERY      No Known Allergies  Allergies as of 07/05/2018   No Known Allergies     Medication List       Accurate as of July 05, 2018 11:59 PM. Always use your most recent med list.        ACCU-CHEK FASTCLIX LANCETS Misc Use to check blood sugar up to four times daily. Dx:E11.21   atorvastatin 10 MG tablet Commonly known as:  LIPITOR Take 1 tablet (10 mg total) by mouth daily.   BIOTENE MOISTURIZING MOUTH Soln Use  as directed 5 sprays in the mouth or throat 3 (three) times daily as needed.   blood glucose meter kit and supplies Kit Dispense based on patient and insurance preference. Use up to four times daily as directed. (FOR ICD-9 250.00, 250.01).   Calcium Carbonate-Vitamin D3 600-400 MG-UNIT Tabs Take 1 tablet by mouth 2 (two) times daily.   DESITIN MAXIMUM STRENGTH EX Apply 1 application topically at bedtime. To the groin area to help with itching.   fish oil-omega-3 fatty acids 1000 MG capsule Take 2 g by mouth daily.   glucose blood test strip Commonly known as:  ACCU-CHEK GUIDE Use as instructed   lisinopril 10 MG tablet Commonly known as:  PRINIVIL,ZESTRIL TAKE 1 TABLET BY MOUTH DAILY.   metFORMIN 500 MG tablet Commonly known as:  GLUCOPHAGE TAKE 1 TABLET BY MOUTH TWICE DAILY WITH A MEAL.   PRESERVISION AREDS 2+MULTI VIT PO Take 1 tablet by mouth 2 (two) times daily.   sharps container 1 each by Does not apply route as needed.       Review of Systems:  Review of Systems  Constitutional: Negative for activity change, appetite change, chills, diaphoresis, fatigue, fever and unexpected weight change.  HENT: Positive for hearing loss.  Negative for congestion and voice change.        Dry mouth  Respiratory: Negative for cough, shortness of breath and wheezing.   Cardiovascular: Negative for chest pain, palpitations and leg swelling.  Gastrointestinal: Negative for abdominal distention, abdominal pain, constipation, diarrhea, nausea and vomiting.  Genitourinary: Positive for frequency. Negative for difficulty urinating, dysuria and urgency.       Incontinent of urine.   Musculoskeletal: Positive for arthralgias and gait problem.       Cramps on and off, stretching, pickling juice  Skin: Negative for color change and pallor.  Neurological: Negative for dizziness, speech difficulty, weakness and headaches.       Memory lapses.   Psychiatric/Behavioral: Positive for decreased  concentration and sleep disturbance. Negative for agitation, behavioral problems and hallucinations. The patient is not nervous/anxious.     Health Maintenance  Topic Date Due  . FOOT EXAM  02/16/2016  . OPHTHALMOLOGY EXAM  04/10/2018  . HEMOGLOBIN A1C  12/27/2018  . TETANUS/TDAP  12/15/2021  . INFLUENZA VACCINE  Completed  . DEXA SCAN  Completed  . PNA vac Low Risk Adult  Completed    Physical Exam: Vitals:   07/05/18 1247  BP: 130/62  Pulse: (!) 101  Resp: 18  Temp: 97.9 F (36.6 C)  TempSrc: Oral  SpO2: 96%  Weight: 162 lb 12.8 oz (73.8 kg)  Height: '5\' 3"'  (1.6 m)   Body mass index is 28.84 kg/m. Physical Exam Constitutional:      General: She is not in acute distress.    Appearance: Normal appearance. She is not diaphoretic.     Comments: overweight  HENT:     Head: Normocephalic and atraumatic.     Nose: Nose normal.     Mouth/Throat:     Mouth: Mucous membranes are moist.  Eyes:     Extraocular Movements: Extraocular movements intact.     Pupils: Pupils are equal, round, and reactive to light.  Neck:     Musculoskeletal: Normal range of motion and neck supple.  Cardiovascular:     Rate and Rhythm: Normal rate and regular rhythm.     Heart sounds: No murmur.  Pulmonary:     Effort: Pulmonary effort is normal.     Breath sounds: Normal breath sounds. No wheezing, rhonchi or rales.  Abdominal:     General: There is no distension.     Palpations: Abdomen is soft.     Tenderness: There is no abdominal tenderness. There is no guarding or rebound.  Musculoskeletal:     Right lower leg: No edema.     Left lower leg: No edema.     Comments: Ambulates with walker  Skin:    General: Skin is warm and dry.  Neurological:     General: No focal deficit present.     Mental Status: She is alert. Mental status is at baseline.     Cranial Nerves: No cranial nerve deficit.     Motor: No weakness.     Coordination: Coordination normal.     Gait: Gait abnormal.      Comments: Oriented to person and place.   Psychiatric:        Mood and Affect: Mood normal.        Behavior: Behavior normal.     Labs reviewed: Basic Metabolic Panel: Recent Labs    11/15/17 0000 02/26/18 0000 06/27/18 0000  NA 137 137 139  K 4.5 4.3 4.2  CL 102 104 104  CO2 26  24 24  GLUCOSE 147* 165* 149*  BUN '23 23 17  ' CREATININE 0.91* 0.76 0.83  CALCIUM 9.7 9.9 9.5  TSH  --   --  2.09   Liver Function Tests: Recent Labs    11/15/17 0000 02/26/18 0000  AST 15 24  ALT 11 36*  BILITOT 0.8 0.5  PROT 7.3 6.9   No results for input(s): LIPASE, AMYLASE in the last 8760 hours. No results for input(s): AMMONIA in the last 8760 hours. CBC: Recent Labs    11/15/17 0000  WBC 8.9  HGB 14.3  HCT 41.8  MCV 89.7  PLT 315   Lipid Panel: Recent Labs    11/15/17 0000 02/26/18 0000  CHOL 278* 186  HDL 46* 48*  LDLCALC 186* 105*  TRIG 257* 210*  CHOLHDL 6.0* 3.9   Lab Results  Component Value Date   HGBA1C 6.8 (H) 06/27/2018    Procedures since last visit: No results found.  Assessment/Plan  Hypertension Blood pressure is controlled, continue Lisinopril 41m qd.   Diabetes mellitus Blood pressure is controlled, continue Metformin 5071mbid.   Hyponatremia 06/27/18 Na 139, K 4.2, Bun 17, creat 0.83, a1c 6.8, TSH 2.09  Vitamin B12 deficiency 06/27/18 Na 139, K 4.2, Bun 17, creat 0.83, a1c 6.8, TSH 2.09  Diabetes mellitus type 2, controlled ([facility] Na 139, K 4.2, Bun 17, creat 0.83, a1c 6.8, TSH 2.09, continue Metformin 50021mid, update a1c prior to the next appointment. Monitor CBG 2-3x/week.     Labs/tests ordered:  Hgb a1c 3 months  Next appt: 4 months.

## 2018-07-05 NOTE — Patient Instructions (Signed)
Continue diet, exercise, Metformin 500mg  bid, f/u Hgb a1c prior to the next appointment in 4 months.

## 2018-07-05 NOTE — Assessment & Plan Note (Addendum)
06/27/18 Na 139, K 4.2, Bun 17, creat 0.83, a1c 6.8, TSH 2.09, continue Metformin 500mg  bid, update a1c prior to the next appointment. Monitor CBG 2-3x/week.

## 2018-09-03 ENCOUNTER — Other Ambulatory Visit: Payer: Self-pay | Admitting: *Deleted

## 2018-09-03 MED ORDER — LISINOPRIL 10 MG PO TABS: 10.0000 mg | ORAL_TABLET | Freq: Every day | ORAL | 0 refills | Status: DC

## 2018-09-03 NOTE — Telephone Encounter (Signed)
[place] 

## 2018-09-05 ENCOUNTER — Other Ambulatory Visit: Payer: Self-pay | Admitting: Nurse Practitioner

## 2018-10-29 ENCOUNTER — Other Ambulatory Visit: Payer: Self-pay | Admitting: *Deleted

## 2018-10-29 MED ORDER — UNISTIK 3 COMFORT MISC: 3 refills | Status: DC

## 2018-10-29 NOTE — Telephone Encounter (Signed)
Patient requested refill

## 2018-11-05 ENCOUNTER — Other Ambulatory Visit: Payer: Self-pay | Admitting: *Deleted

## 2018-11-05 ENCOUNTER — Telehealth: Payer: Self-pay | Admitting: Internal Medicine

## 2018-11-05 MED ORDER — UNISTIK 3 COMFORT MISC: 3 refills | Status: DC

## 2018-11-05 NOTE — Telephone Encounter (Signed)
See Refill dated 11/05/2018

## 2018-11-05 NOTE — Telephone Encounter (Signed)
Pt has recieved the wrong materials/meds for her diabetic machine.  She's confused on how this happened. [facility] to [facility] us to work out the issue.  A family member has picked these supplies up & returned them for her being she is on lock down at her facility.  Please advise. Thanks, Lattie Haw

## 2018-11-05 NOTE — Telephone Encounter (Signed)
Patient called and stated that the pharmacy did not give her the unistik 2 comfort lancets. They gave her Accuchek and she doesn't want that.  Rx refaxed with note.

## 2018-11-06 ENCOUNTER — Other Ambulatory Visit: Payer: Medicare Other

## 2018-11-06 DIAGNOSIS — E1122 Type 2 diabetes mellitus with diabetic chronic kidney disease: Secondary | ICD-10-CM | POA: Diagnosis not present

## 2018-11-06 DIAGNOSIS — E1121 Type 2 diabetes mellitus with diabetic nephropathy: Secondary | ICD-10-CM | POA: Diagnosis not present

## 2018-11-07 LAB — HEMOGLOBIN A1C
Hgb A1c MFr Bld: 6 % of total Hgb — ABNORMAL HIGH (ref <5.7)
Mean Plasma Glucose: 126 (calc)
eAG (mmol/L): 7 (calc)

## 2018-11-08 ENCOUNTER — Non-Acute Institutional Stay: Payer: Medicare Other | Admitting: Nurse Practitioner

## 2018-11-08 ENCOUNTER — Encounter: Payer: Self-pay | Admitting: Nurse Practitioner

## 2018-11-08 ENCOUNTER — Other Ambulatory Visit: Payer: Self-pay

## 2018-11-08 DIAGNOSIS — I10 Essential (primary) hypertension: Secondary | ICD-10-CM

## 2018-11-08 DIAGNOSIS — E1169 Type 2 diabetes mellitus with other specified complication: Secondary | ICD-10-CM | POA: Diagnosis not present

## 2018-11-08 DIAGNOSIS — E1121 Type 2 diabetes mellitus with diabetic nephropathy: Secondary | ICD-10-CM

## 2018-11-08 DIAGNOSIS — E785 Hyperlipidemia, unspecified: Secondary | ICD-10-CM

## 2018-11-08 MED ORDER — METFORMIN HCL 500 MG PO TABS: ORAL_TABLET | ORAL | 2 refills | Status: DC

## 2018-11-08 NOTE — Assessment & Plan Note (Signed)
Mildly elevated LDL, continue Atorvastatin 10mg  qd, update lipid panel prior to the next appointment.

## 2018-11-08 NOTE — Patient Instructions (Signed)
CBC/diff, CMP/eGFR, Hgb a1c, lipid panel prior to the next appointment 6 months with Dr. Lyndel Safe.

## 2018-11-08 NOTE — Assessment & Plan Note (Signed)
Blood pressure is in control, continue Lisinopril 10mg  qd.

## 2018-11-08 NOTE — Progress Notes (Signed)
Location:   clinic [facility]   Place of Service:  Clinic (12) Provider: Marlana Latus NP  Code Status: DNR Goals of Care: [place] 11/08/2018  Does Patient Have a Medical Advance Directive? Yes  Type of Advance Directive Living will  Does patient want to make changes to medical advance directive? No - Patient declined  Copy of [place] in Chart? -  Pre-existing out of facility DNR order (yellow form or pink MOST form) -     Chief Complaint  Patient presents with  . Medical Management of Chronic Issues    4 month follow up,discuss lab work     HPI: Patient is a 85 y.o. female seen today for medical management of chronic diseases.     The patient has hx of T2DM, blood sugar is controlled, on Metformin 524m bid, last Hgb a1c 7.0 11/06/18. HTN, blood pressure is controlled on Lisinopril 110mqd. Hyperlipidemia, on Atorvastatin 105md.   Past Medical History:  Diagnosis Date  . Allergy   . Arthritis   . Diabetes mellitus   . Hernia, abdominal   . Hyperlipidemia   . Hypertension   . Osteopenia, senile 09/26/2012   T score - 1.6 Left femur    Past Surgical History:  Procedure Laterality Date  . APPENDECTOMY    . [place] EYE SURGERY    . GUM SURGERY    . KNEE SURGERY      No Known Allergies  Allergies as of 11/08/2018   No Known Allergies     Medication List       Accurate as of November 08, 2018  4:51 PM. Always use your most recent med list.        atorvastatin 10 MG tablet Commonly known as:  LIPITOR Take 1 tablet (10 mg total) by mouth daily.   Biotene Moisturizing Mouth Soln Use as directed 5 sprays in the mouth or throat 3 (three) times daily as needed.   blood glucose meter kit and supplies Kit Dispense based on patient and insurance preference. Use up to four times daily as directed. (FOR ICD-9 250.00, 250.01).   Calcium Carbonate-Vitamin D3 600-400 MG-UNIT Tabs Take 1 tablet by mouth 2 (two) times  daily.   DESITIN MAXIMUM STRENGTH EX Apply 1 application topically at bedtime. To the groin area to help with itching.   fish oil-omega-3 fatty acids 1000 MG capsule Take 2 g by mouth daily.   glucose blood test strip Commonly known as:  Accu-Chek Guide Use as instructed   lisinopril 10 MG tablet Commonly known as:  ZESTRIL Take 1 tablet (10 mg total) by mouth daily.   metFORMIN 500 MG tablet Commonly known as:  GLUCOPHAGE TAKE 1 TABLET BY MOUTH TWICE DAILY WITH A MEAL.   PRESERVISION AREDS 2+MULTI VIT PO Take 1 tablet by mouth 2 (two) times daily.   sharps container 1 each by Does not apply route as needed.   [place] 3 Comfort Misc Use to test blood sugar four times daily Dx: E11.21       Review of Systems:  Review of Systems  Constitutional: Negative for activity change, appetite change, chills, diaphoresis, fatigue and fever.  HENT: Positive for hearing loss. Negative for congestion and voice change.   Eyes: Negative for visual disturbance.  Respiratory: Negative for cough.   Cardiovascular: Negative for chest pain, palpitations and leg swelling.  Gastrointestinal: Negative for abdominal distention, abdominal pain, constipation, diarrhea, nausea  and vomiting.  Genitourinary: Positive for frequency. Negative for difficulty urinating, dysuria and urgency.       Urinary frequency at night about 2 hours, not new, not sleeping well at night, but no dysuria, urinary urgency, or abd pain.   Musculoskeletal: Positive for arthralgias and gait problem.  Skin: Negative for color change and pallor.  Neurological: Negative for dizziness, speech difficulty, weakness and headaches.  Psychiatric/Behavioral: Positive for sleep disturbance. Negative for agitation, behavioral problems and hallucinations. The patient is not nervous/anxious.        Declined sleeping aid.     Health Maintenance  Topic Date Due  . FOOT EXAM  02/16/2016  . OPHTHALMOLOGY EXAM  04/10/2018  . INFLUENZA  VACCINE  02/09/2019  . HEMOGLOBIN A1C  05/08/2019  . TETANUS/TDAP  12/15/2021  . DEXA SCAN  Completed  . PNA vac Low Risk Adult  Completed    Physical Exam: Vitals:   11/08/18 1307  BP: 130/62  Pulse: 77  Temp: 99.1 F (37.3 C)  TempSrc: Temporal  SpO2: (!) 77%  Weight: 159 lb 6.4 oz (72.3 kg)  Height: _0  (1.6 m)   Body mass index is 28.24 kg/m. Physical Exam Constitutional:      General: She is not in acute distress.    Appearance: Normal appearance. She is not ill-appearing, toxic-appearing or diaphoretic.  HENT:     Head: Normocephalic and atraumatic.     Nose: Nose normal. No congestion or rhinorrhea.     Mouth/Throat:     Mouth: Mucous membranes are moist.  Eyes:     Extraocular Movements: Extraocular movements intact.     Pupils: Pupils are equal, round, and reactive to light.  Neck:     Musculoskeletal: Normal range of motion and neck supple.  Cardiovascular:     Rate and Rhythm: Normal rate and regular rhythm.     Heart sounds: No murmur.  Pulmonary:     Effort: Pulmonary effort is normal.     Breath sounds: Normal breath sounds.  Abdominal:     General: There is no distension.     Palpations: Abdomen is soft.     Tenderness: There is no abdominal tenderness. There is no guarding or rebound.  Musculoskeletal:     Right lower leg: No edema.     Left lower leg: No edema.  Skin:    General: Skin is warm and dry.  Neurological:     Mental Status: She is alert. Mental status is at baseline.     Gait: Gait abnormal.     Comments: Oriented to person and place.   Psychiatric:        Mood and Affect: Mood normal.        Behavior: Behavior normal.     Labs reviewed: Basic Metabolic Panel: Recent Labs    11/15/17 0000 02/26/18 0000 06/27/18 0000  NA 137 137 139  K 4.5 4.3 4.2  CL 102 104 104  CO2 _1 GLUCOSE 147* 165* 149*  BUN _2 CREATININE 0.91* 0.76 0.83  CALCIUM 9.7 9.9 9.5  TSH  --   --  2.09   Liver Function Tests:  Recent Labs    11/15/17 0000 02/26/18 0000  AST 15 24  ALT 11 36*  BILITOT 0.8 0.5  PROT 7.3 6.9   No results for input(s): LIPASE, AMYLASE in the last 8760 hours. No results for input(s): AMMONIA in the last 8760 hours. CBC: Recent Labs    11/15/17  0000  WBC 8.9  HGB 14.3  HCT 41.8  MCV 89.7  PLT 315   Lipid Panel: Recent Labs    11/15/17 0000 02/26/18 0000  CHOL 278* 186  HDL 46* 48*  LDLCALC 186* 105*  TRIG 257* 210*  CHOLHDL 6.0* 3.9   Lab Results  Component Value Date   HGBA1C 6.0 (H) 11/06/2018    Procedures since last visit: No results found.  Assessment/Plan  Hypertension Blood pressure is in control, continue Lisinopril 59m qd.   Diabetes mellitus type 2, controlled ([place] Controlled, continue Metformin 5030mbid, last Hgb a1c 6.0 11/06/18  Hyperlipidemia associated with type 2 diabetes mellitus (HCC) Mildly elevated LDL, continue Atorvastatin 1041md, update lipid panel prior to the next appointment.    Labs/tests ordered: CBC/diff, CMP/eGFR, Hgb a1c, lipid panel prior to the next appointment  Next appt:  6 months with Dr. GupLyndel Safe

## 2018-11-08 NOTE — Assessment & Plan Note (Signed)
Controlled, continue Metformin 500mg  bid, last Hgb a1c 6.0 11/06/18

## 2018-11-27 ENCOUNTER — Other Ambulatory Visit: Payer: Self-pay | Admitting: Internal Medicine

## 2018-12-13 ENCOUNTER — Other Ambulatory Visit: Payer: Self-pay | Admitting: Nurse Practitioner

## 2018-12-24 ENCOUNTER — Other Ambulatory Visit: Payer: Self-pay | Admitting: Internal Medicine

## 2019-01-02 ENCOUNTER — Other Ambulatory Visit: Payer: Self-pay | Admitting: *Deleted

## 2019-01-02 MED ORDER — ATORVASTATIN CALCIUM 10 MG PO TABS: 10.0000 mg | ORAL_TABLET | Freq: Every day | ORAL | 0 refills | Status: DC

## 2019-01-02 NOTE — Telephone Encounter (Signed)
[facility]  

## 2019-04-09 ENCOUNTER — Other Ambulatory Visit: Payer: Self-pay

## 2019-04-16 ENCOUNTER — Other Ambulatory Visit: Payer: Self-pay | Admitting: Nurse Practitioner

## 2019-05-06 ENCOUNTER — Other Ambulatory Visit: Payer: Self-pay | Admitting: Nurse Practitioner

## 2019-05-06 DIAGNOSIS — I10 Essential (primary) hypertension: Secondary | ICD-10-CM | POA: Diagnosis not present

## 2019-05-06 DIAGNOSIS — E1121 Type 2 diabetes mellitus with diabetic nephropathy: Secondary | ICD-10-CM | POA: Diagnosis not present

## 2019-05-07 ENCOUNTER — Other Ambulatory Visit: Payer: Medicare Other

## 2019-05-07 ENCOUNTER — Other Ambulatory Visit: Payer: Self-pay

## 2019-05-07 DIAGNOSIS — I10 Essential (primary) hypertension: Secondary | ICD-10-CM

## 2019-05-07 DIAGNOSIS — E1121 Type 2 diabetes mellitus with diabetic nephropathy: Secondary | ICD-10-CM

## 2019-05-08 LAB — LIPID PANEL
Cholesterol: 164 mg/dL (ref <200)
HDL: 50 mg/dL (ref >=50)
LDL Cholesterol (Calc): 87 mg/dL (calc)
Non-HDL Cholesterol (Calc): 114 mg/dL (calc) (ref <130)
Total CHOL/HDL Ratio: 3.3 (calc) (ref <5.0)
Triglycerides: 173 mg/dL — ABNORMAL HIGH (ref <150)

## 2019-05-08 LAB — COMPLETE METABOLIC PANEL WITH GFR
AG Ratio: 1.7 (calc) (ref 1.0–2.5)
ALT: 12 U/L (ref 6–29)
AST: 17 U/L (ref 10–35)
Albumin: 4.5 g/dL (ref 3.6–5.1)
Alkaline phosphatase (APISO): 56 U/L (ref 37–153)
BUN: 17 mg/dL (ref 7–25)
CO2: 23 mmol/L (ref 20–32)
Calcium: 9.8 mg/dL (ref 8.6–10.4)
Chloride: 103 mmol/L (ref 98–110)
Creat: 0.73 mg/dL (ref 0.60–0.88)
GFR, Est African American: 81 mL/min/{1.73_m2} (ref >=60)
GFR, Est Non African American: 70 mL/min/{1.73_m2} (ref >=60)
Globulin: 2.7 g/dL (calc) (ref 1.9–3.7)
Glucose, Bld: 114 mg/dL — ABNORMAL HIGH (ref 65–99)
Potassium: 4.3 mmol/L (ref 3.5–5.3)
Sodium: 139 mmol/L (ref 135–146)
Total Bilirubin: 0.7 mg/dL (ref 0.2–1.2)
Total Protein: 7.2 g/dL (ref 6.1–8.1)

## 2019-05-08 LAB — HEMOGLOBIN A1C
Hgb A1c MFr Bld: 5.9 % of total Hgb — ABNORMAL HIGH (ref <5.7)
Mean Plasma Glucose: 123 (calc)
eAG (mmol/L): 6.8 (calc)

## 2019-05-10 ENCOUNTER — Encounter: Payer: Self-pay | Admitting: Internal Medicine

## 2019-05-10 ENCOUNTER — Non-Acute Institutional Stay: Payer: Medicare Other | Admitting: Internal Medicine

## 2019-05-10 ENCOUNTER — Other Ambulatory Visit: Payer: Self-pay

## 2019-05-10 VITALS — BP 158/70 | HR 92 | Temp 97.3°F | Ht 63.0 in | Wt 157.4 lb

## 2019-05-10 DIAGNOSIS — N3942 Incontinence without sensory awareness: Secondary | ICD-10-CM

## 2019-05-10 DIAGNOSIS — E785 Hyperlipidemia, unspecified: Secondary | ICD-10-CM

## 2019-05-10 DIAGNOSIS — M8580 Other specified disorders of bone density and structure, unspecified site: Secondary | ICD-10-CM

## 2019-05-10 DIAGNOSIS — R682 Dry mouth, unspecified: Secondary | ICD-10-CM

## 2019-05-10 DIAGNOSIS — E1121 Type 2 diabetes mellitus with diabetic nephropathy: Secondary | ICD-10-CM | POA: Diagnosis not present

## 2019-05-10 DIAGNOSIS — E1169 Type 2 diabetes mellitus with other specified complication: Secondary | ICD-10-CM

## 2019-05-10 DIAGNOSIS — I10 Essential (primary) hypertension: Secondary | ICD-10-CM | POA: Diagnosis not present

## 2019-05-10 NOTE — Progress Notes (Signed)
Location:  [place] of Service:  Clinic (12)  Provider:   Code Status:  Goals of Care:  Advanced Directives 11/08/2018  Does Patient Have a Medical Advance Directive? Yes  Type of Advance Directive Living will  Does patient want to make changes to medical advance directive? No - Patient declined  Copy of [place] in Chart? -  Pre-existing out of facility DNR order (yellow form or pink MOST form) -     Chief Complaint  Patient presents with  . Medical Management of Chronic Issues    6 month follow up. Patient complains of dry mouth, lips and nose. Hip pain has been bothering her. She wonders if she is having a urinary infection due to urinary frequency which is more so at night.  . Health Maintenance    Foot and eye exam. Patient has not been monitoring her blood sugars.     HPI: Patient is a 85 y.o. female seen today for medical management of chronic diseases.   Patient has h/o Hypertension, Diabetes Mellitus , Hyperlipidemia and Osteopenia, Left knee arthritis.  [place] . [place] no family local. No children never been Married. Walks with the [employer]. Independent in her ADLS and IADLS Her POA is her Niece.  Hypertension Was high today as she said she was upset as she got stuck in the elevator yesterday On Lisinopril Renal Function Normal Diabetes Mellitus AIC less then 6 Has Eye Appointment this week Arthritis Has Seen Dr Veverly Fells Before he injected her Knee and told her to use walker Dry Mouth and Nose Has tried Biotene before Urinary Frequency Mostly at night she is going every 2 hours. Has both Stress and Urge   Past Medical History:  Diagnosis Date  . Allergy   . Arthritis   . Diabetes mellitus   . Hernia, abdominal   . Hyperlipidemia   . Hypertension   . Osteopenia, senile 09/26/2012   T score - 1.6 Left femur    Past Surgical History:  Procedure Laterality Date  . APPENDECTOMY    .  [place]  . EYE SURGERY    . GUM SURGERY    . KNEE SURGERY      No Known Allergies  Outpatient Encounter Medications as of 05/10/2019  Medication Sig  . Artificial Saliva (BIOTENE MOISTURIZING MOUTH) SOLN Use as directed 5 sprays in the mouth or throat 3 (three) times daily as needed.  . Ascorbic Acid (VITAMIN C) 100 MG tablet Take 100 mg by mouth daily.  Marland Kitchen atorvastatin (LIPITOR) 10 MG tablet Take 1 tablet (10 mg total) by mouth daily.  . Calcium Carbonate-Vitamin D3 600-400 MG-UNIT TABS Take 1 tablet by mouth 2 (two) times daily.  . Cholecalciferol (VITAMIN D-3) 125 MCG (5000 UT) TABS Take by mouth daily.  . fish oil-omega-3 fatty acids 1000 MG capsule Take 2 g by mouth daily.  . Flax Oil-Fish Oil-Borage Oil CAPS Take by mouth daily.  Marland Kitchen lisinopril (ZESTRIL) 10 MG tablet TAKE 1 TABLET BY MOUTH DAILY.  . metFORMIN (GLUCOPHAGE) 500 MG tablet TAKE 1 TABLET BY MOUTH TWICE DAILY WITH A MEAL.  . Multiple Vitamins-Minerals (PRESERVISION AREDS 2+MULTI VIT PO) Take 1 tablet by mouth 2 (two) times daily.  . vitamin E 100 UNIT capsule Take by mouth daily.  . [DISCONTINUED] blood glucose meter kit and supplies KIT Dispense based on patient and insurance preference. Use up to four times  daily as directed. (FOR ICD-9 250.00, 250.01).  . [DISCONTINUED] glucose blood (ACCU-CHEK GUIDE) test strip Use as instructed  . [DISCONTINUED] Lancets Misc. (UNISTIK 3 COMFORT) MISC Use to test blood sugar four times daily Dx: E11.21  . [DISCONTINUED] sharps container 1 each by Does not apply route as needed.  . [DISCONTINUED] Zinc Oxide (DESITIN MAXIMUM STRENGTH EX) Apply 1 application topically at bedtime. To the groin area to help with itching.   No facility-administered encounter medications on file as of 05/10/2019.     Review of Systems:  Review of Systems  Constitutional: Negative.   HENT: Negative.   Respiratory: Negative.   Cardiovascular: Negative.   Gastrointestinal: Negative.    Genitourinary: Positive for frequency and urgency.  Musculoskeletal: Positive for arthralgias and gait problem.  Neurological: Positive for weakness.  Psychiatric/Behavioral: Positive for sleep disturbance.  All other systems reviewed and are negative.   Health Maintenance  Topic Date Due  . FOOT EXAM  02/16/2016  . OPHTHALMOLOGY EXAM  04/10/2018  . HEMOGLOBIN A1C  11/04/2019  . TETANUS/TDAP  12/15/2021  . INFLUENZA VACCINE  Completed  . DEXA SCAN  Completed  . PNA vac Low Risk Adult  Completed    Physical Exam: Vitals:   05/10/19 0941  BP: (!) 158/70  Pulse: 92  Temp: (!) 97.3 F (36.3 C)  SpO2: 96%  Weight: 157 lb 6.4 oz (71.4 kg)  Height: '5\' 3"'  (1.6 m)   Body mass index is 27.88 kg/m. Physical Exam Vitals signs reviewed.  Constitutional:      Appearance: Normal appearance.  HENT:     Head: Normocephalic.     Nose: Nose normal.     Mouth/Throat:     Mouth: Mucous membranes are moist.     Pharynx: Oropharynx is clear.  Eyes:     Pupils: Pupils are equal, round, and reactive to light.  Neck:     Musculoskeletal: Neck supple.  Cardiovascular:     Rate and Rhythm: Normal rate and regular rhythm.     Pulses: Normal pulses.     Heart sounds: No murmur.  Pulmonary:     Effort: Pulmonary effort is normal. No respiratory distress.     Breath sounds: Normal breath sounds. No wheezing or rales.  Abdominal:     General: Abdomen is flat. Bowel sounds are normal.     Palpations: Abdomen is soft.  Musculoskeletal:        General: No swelling.  Skin:    General: Skin is warm.  Neurological:     General: No focal deficit present.     Mental Status: She is alert and oriented to person, place, and time.     Comments: Walks with the walker Steady Gait  Psychiatric:        Mood and Affect: Mood normal.        Thought Content: Thought content normal.     Labs reviewed: Basic Metabolic Panel: Recent Labs    06/27/18 0000 05/06/19 1031  NA 139 139  K 4.2 4.3   CL 104 103  CO2 24 23  GLUCOSE 149* 114*  BUN 17 17  CREATININE 0.83 0.73  CALCIUM 9.5 9.8  TSH 2.09  --    Liver Function Tests: Recent Labs    05/06/19 1031  AST 17  ALT 12  BILITOT 0.7  PROT 7.2   No results for input(s): LIPASE, AMYLASE in the last 8760 hours. No results for input(s): AMMONIA in the last 8760 hours. CBC: No results for  input(s): WBC, NEUTROABS, HGB, HCT, MCV, PLT in the last 8760 hours. Lipid Panel: Recent Labs    05/06/19 1031  CHOL 164  HDL 50  LDLCALC 87  TRIG 173*  CHOLHDL 3.3   Lab Results  Component Value Date   HGBA1C 5.9 (H) 05/06/2019    Procedures since last visit: No results found.  Assessment/Plan Essential hypertension BP Usually Controlled on Lisinopril Reval next visit  Controlled type 2 diabetes mellitus  Only on Metformin A1C 5.9 D/W the patient that she can go down on  Her dose to QD  Hyperlipidemia LDL less then 100 On Statin  Osteopenia,  On Calcium and VItd Urinary incontinence  Will start with UA and Culture She does not want any Meds right now Discuss next visit Dry mouth Discontinue Flonase Continue Biotene    Follow up in 4 months Labs before that Labs/tests ordered:  * No order type specified * Next appt:  Visit date not found  Total time spent in this patient care encounter was  45_  minutes; greater than 50% of the visit spent counseling patient and staff, reviewing records , Labs and coordinating care for problems addressed at this encounter.

## 2019-05-13 DIAGNOSIS — N3942 Incontinence without sensory awareness: Secondary | ICD-10-CM | POA: Diagnosis not present

## 2019-05-14 ENCOUNTER — Other Ambulatory Visit: Payer: Self-pay

## 2019-05-14 DIAGNOSIS — E1169 Type 2 diabetes mellitus with other specified complication: Secondary | ICD-10-CM | POA: Diagnosis not present

## 2019-05-14 DIAGNOSIS — E785 Hyperlipidemia, unspecified: Secondary | ICD-10-CM | POA: Diagnosis not present

## 2019-05-14 DIAGNOSIS — E1121 Type 2 diabetes mellitus with diabetic nephropathy: Secondary | ICD-10-CM | POA: Diagnosis not present

## 2019-05-14 DIAGNOSIS — I10 Essential (primary) hypertension: Secondary | ICD-10-CM | POA: Diagnosis not present

## 2019-05-14 DIAGNOSIS — N3942 Incontinence without sensory awareness: Secondary | ICD-10-CM

## 2019-05-15 LAB — COMPLETE METABOLIC PANEL WITH GFR
AG Ratio: 1.7 (calc) (ref 1.0–2.5)
ALT: 11 U/L (ref 6–29)
AST: 16 U/L (ref 10–35)
Albumin: 4.6 g/dL (ref 3.6–5.1)
Alkaline phosphatase (APISO): 61 U/L (ref 37–153)
BUN: 25 mg/dL (ref 7–25)
CO2: 27 mmol/L (ref 20–32)
Calcium: 9.8 mg/dL (ref 8.6–10.4)
Chloride: 101 mmol/L (ref 98–110)
Creat: 0.75 mg/dL (ref 0.60–0.88)
GFR, Est African American: 79 mL/min/{1.73_m2} (ref >=60)
GFR, Est Non African American: 68 mL/min/{1.73_m2} (ref >=60)
Globulin: 2.7 g/dL (calc) (ref 1.9–3.7)
Glucose, Bld: 115 mg/dL — ABNORMAL HIGH (ref 65–99)
Potassium: 4.4 mmol/L (ref 3.5–5.3)
Sodium: 138 mmol/L (ref 135–146)
Total Bilirubin: 0.6 mg/dL (ref 0.2–1.2)
Total Protein: 7.3 g/dL (ref 6.1–8.1)

## 2019-05-15 LAB — URINALYSIS
Bilirubin Urine: NEGATIVE
Glucose, UA: NEGATIVE
Hgb urine dipstick: NEGATIVE
Ketones, ur: NEGATIVE
Nitrite: NEGATIVE
Protein, ur: NEGATIVE
Specific Gravity, Urine: 1.012 (ref 1.001–1.03)
pH: <=5 (ref 5.0–8.0)

## 2019-05-15 LAB — URINE CULTURE
MICRO NUMBER:: 1059009
SPECIMEN QUALITY:: ADEQUATE

## 2019-05-15 LAB — TSH: TSH: 2.59 mIU/L (ref 0.40–4.50)

## 2019-05-17 DIAGNOSIS — Z961 Presence of intraocular lens: Secondary | ICD-10-CM | POA: Diagnosis not present

## 2019-05-17 DIAGNOSIS — H52203 Unspecified astigmatism, bilateral: Secondary | ICD-10-CM | POA: Diagnosis not present

## 2019-05-17 DIAGNOSIS — E119 Type 2 diabetes mellitus without complications: Secondary | ICD-10-CM | POA: Diagnosis not present

## 2019-05-17 DIAGNOSIS — H353132 Nonexudative age-related macular degeneration, bilateral, intermediate dry stage: Secondary | ICD-10-CM | POA: Diagnosis not present

## 2019-06-18 ENCOUNTER — Other Ambulatory Visit: Payer: Self-pay | Admitting: Internal Medicine

## 2019-06-24 DIAGNOSIS — H353132 Nonexudative age-related macular degeneration, bilateral, intermediate dry stage: Secondary | ICD-10-CM | POA: Diagnosis not present

## 2019-06-24 DIAGNOSIS — H5203 Hypermetropia, bilateral: Secondary | ICD-10-CM | POA: Diagnosis not present

## 2019-07-15 DIAGNOSIS — Z23 Encounter for immunization: Secondary | ICD-10-CM | POA: Diagnosis not present

## 2019-07-16 ENCOUNTER — Other Ambulatory Visit: Payer: Self-pay

## 2019-07-18 ENCOUNTER — Other Ambulatory Visit: Payer: Self-pay | Admitting: Internal Medicine

## 2019-07-18 ENCOUNTER — Other Ambulatory Visit: Payer: Self-pay

## 2019-07-18 DIAGNOSIS — E1121 Type 2 diabetes mellitus with diabetic nephropathy: Secondary | ICD-10-CM | POA: Diagnosis not present

## 2019-07-18 DIAGNOSIS — I10 Essential (primary) hypertension: Secondary | ICD-10-CM | POA: Diagnosis not present

## 2019-07-19 ENCOUNTER — Encounter: Payer: Self-pay | Admitting: Internal Medicine

## 2019-07-19 ENCOUNTER — Other Ambulatory Visit: Payer: Self-pay

## 2019-07-19 ENCOUNTER — Non-Acute Institutional Stay: Payer: Medicare Other | Admitting: Internal Medicine

## 2019-07-19 ENCOUNTER — Other Ambulatory Visit: Payer: Self-pay | Admitting: Internal Medicine

## 2019-07-19 VITALS — BP 152/60 | HR 90 | Temp 96.8°F | Ht 63.0 in | Wt 156.8 lb

## 2019-07-19 DIAGNOSIS — I10 Essential (primary) hypertension: Secondary | ICD-10-CM

## 2019-07-19 DIAGNOSIS — E1121 Type 2 diabetes mellitus with diabetic nephropathy: Secondary | ICD-10-CM | POA: Diagnosis not present

## 2019-07-19 DIAGNOSIS — F418 Other specified anxiety disorders: Secondary | ICD-10-CM

## 2019-07-19 DIAGNOSIS — E1169 Type 2 diabetes mellitus with other specified complication: Secondary | ICD-10-CM | POA: Diagnosis not present

## 2019-07-19 DIAGNOSIS — E785 Hyperlipidemia, unspecified: Secondary | ICD-10-CM | POA: Diagnosis not present

## 2019-07-19 LAB — CBC WITH DIFFERENTIAL/PLATELET
Absolute Monocytes: 1038 cells/uL — ABNORMAL HIGH (ref 200–950)
Basophils Absolute: 68 cells/uL (ref 0–200)
Basophils Relative: 0.7 %
Eosinophils Absolute: 272 cells/uL (ref 15–500)
Eosinophils Relative: 2.8 %
HCT: 37.6 % (ref 35.0–45.0)
Hemoglobin: 12.8 g/dL (ref 11.7–15.5)
Lymphs Abs: 4743 cells/uL — ABNORMAL HIGH (ref 850–3900)
MCH: 31.4 pg (ref 27.0–33.0)
MCHC: 34 g/dL (ref 32.0–36.0)
MCV: 92.2 fL (ref 80.0–100.0)
MPV: 11.8 fL (ref 7.5–12.5)
Monocytes Relative: 10.7 %
Neutro Abs: 3579 cells/uL (ref 1500–7800)
Neutrophils Relative %: 36.9 %
Platelets: 276 10*3/uL (ref 140–400)
RBC: 4.08 10*6/uL (ref 3.80–5.10)
RDW: 12.4 % (ref 11.0–15.0)
Total Lymphocyte: 48.9 %
WBC: 9.7 10*3/uL (ref 3.8–10.8)

## 2019-07-19 LAB — COMPLETE METABOLIC PANEL WITH GFR
AG Ratio: 1.6 (calc) (ref 1.0–2.5)
ALT: 12 U/L (ref 6–29)
AST: 17 U/L (ref 10–35)
Albumin: 4.1 g/dL (ref 3.6–5.1)
Alkaline phosphatase (APISO): 55 U/L (ref 37–153)
BUN: 17 mg/dL (ref 7–25)
CO2: 24 mmol/L (ref 20–32)
Calcium: 9.6 mg/dL (ref 8.6–10.4)
Chloride: 105 mmol/L (ref 98–110)
Creat: 0.72 mg/dL (ref 0.60–0.88)
GFR, Est African American: 82 mL/min/{1.73_m2} (ref >=60)
GFR, Est Non African American: 71 mL/min/{1.73_m2} (ref >=60)
Globulin: 2.5 g/dL (calc) (ref 1.9–3.7)
Glucose, Bld: 128 mg/dL — ABNORMAL HIGH (ref 65–99)
Potassium: 3.9 mmol/L (ref 3.5–5.3)
Sodium: 141 mmol/L (ref 135–146)
Total Bilirubin: 0.7 mg/dL (ref 0.2–1.2)
Total Protein: 6.6 g/dL (ref 6.1–8.1)

## 2019-07-19 LAB — HEMOGLOBIN A1C
Hgb A1c MFr Bld: 6.1 % of total Hgb — ABNORMAL HIGH (ref <5.7)
Mean Plasma Glucose: 128 (calc)
eAG (mmol/L): 7.1 (calc)

## 2019-07-19 LAB — TSH: TSH: 2.81 mIU/L (ref 0.40–4.50)

## 2019-07-19 MED ORDER — BETAMETHASONE DIPROPIONATE 0.05 % EX CREA: TOPICAL_CREAM | Freq: Two times a day (BID) | CUTANEOUS | 0 refills | Status: DC

## 2019-07-19 MED ORDER — SERTRALINE HCL 25 MG PO TABS: 25.0000 mg | ORAL_TABLET | Freq: Every day | ORAL | 2 refills | Status: DC

## 2019-07-20 NOTE — Progress Notes (Signed)
Location: [place] of Service:  Clinic (12)  Provider:   Code Status:  Goals of Care:  Advanced Directives 11/08/2018  Does Patient Have a Medical Advance Directive? Yes  Type of Advance Directive Living will  Does patient want to make changes to medical advance directive? No - Patient declined  Copy of [place] in Chart? -  Pre-existing out of facility DNR order (yellow form or pink MOST form) -     Chief Complaint  Patient presents with  . Medical Management of Chronic Issues    4 month follow up. Patient would like to discuss some depression.    HPI: Patient is a 86 y.o. female seen today for an acute visit for Follow up of her BP and Depression  Patient has h/o Hypertension, Diabetes Mellitus , Hyperlipidemia and Osteopenia, Left knee arthritis.  [place] . Has no family local. No children never been Married. Walks with the [employer]. Independent in her ADLS and IADLS Her POA is her Niece. Hypertension Still mildily elevated today but she was very upset today also due to Pandemic restrictions On Lisinopril Depression with anxiety Patient was crying today during the appointment very upset because she is by herself in her apartment and due to restrictions she cannot go out.  She did get her first wife Covid vaccine Rash Patient has some seborrhoic dermatitis in her hair as she is unable to wash her hair and salon is closed  She also has a rash in the back of her neck which was told by Dr. [place] Crane before that it is dermatitis. Urinary frequency Continues to go every 2 hours.  Symptoms similar to stress and urge incontinence Her UA culture was negative Diabetes Mellitus  Arthritis Has Seen Dr Veverly Fells Before he injected her Knee and told her to use walkerOther Stable issues   Past Medical History:  Diagnosis Date  . Allergy   . Arthritis   . Diabetes mellitus   . Hernia, abdominal   . Hyperlipidemia     . Hypertension   . Osteopenia, senile 09/26/2012   T score - 1.6 Left femur    Past Surgical History:  Procedure Laterality Date  . APPENDECTOMY    . [place]  . EYE SURGERY    . GUM SURGERY    . KNEE SURGERY      No Known Allergies  Outpatient Encounter Medications as of 07/19/2019  Medication Sig  . Artificial Saliva (BIOTENE MOISTURIZING MOUTH) SOLN Use as directed 5 sprays in the mouth or throat 3 (three) times daily as needed.  . Ascorbic Acid (VITAMIN C) 100 MG tablet Take 100 mg by mouth daily.  Marland Kitchen atorvastatin (LIPITOR) 10 MG tablet Take 1 tablet (10 mg total) by mouth daily.  . betamethasone dipropionate 0.05 % cream Apply topically 2 (two) times daily.  . Calcium Carbonate-Vitamin D3 600-400 MG-UNIT TABS Take 1 tablet by mouth 2 (two) times daily.  . Cholecalciferol (VITAMIN D-3) 125 MCG (5000 UT) TABS Take by mouth daily.  . fish oil-omega-3 fatty acids 1000 MG capsule Take 2 g by mouth daily.  . Flax Oil-Fish Oil-Borage Oil CAPS Take by mouth daily.  Marland Kitchen lisinopril (ZESTRIL) 10 MG tablet TAKE 1 TABLET BY MOUTH DAILY.  . metFORMIN (GLUCOPHAGE) 500 MG tablet TAKE 1 TABLET BY MOUTH TWICE DAILY WITH A MEAL.  . Multiple Vitamins-Minerals (PRESERVISION AREDS 2+MULTI VIT PO) Take 1 tablet by  mouth 2 (two) times daily.  . sertraline (ZOLOFT) 25 MG tablet Take 1 tablet (25 mg total) by mouth daily.  . vitamin E 100 UNIT capsule Take by mouth daily.  . [DISCONTINUED] lisinopril (ZESTRIL) 10 MG tablet TAKE 1 TABLET BY MOUTH DAILY.   No facility-administered encounter medications on file as of 07/19/2019.    Review of Systems:  Review of Systems  Constitutional: Negative.   HENT: Negative.   Respiratory: Negative.   Cardiovascular: Negative.   Gastrointestinal: Negative.   Genitourinary: Positive for frequency and urgency.  Musculoskeletal: Positive for myalgias.  Skin: Positive for rash.  Neurological: Negative.   Psychiatric/Behavioral: Positive for  behavioral problems, confusion, dysphoric mood and sleep disturbance.  All other systems reviewed and are negative.   Health Maintenance  Topic Date Due  . FOOT EXAM  02/16/2016  . OPHTHALMOLOGY EXAM  04/10/2018  . HEMOGLOBIN A1C  01/15/2020  . TETANUS/TDAP  12/15/2021  . INFLUENZA VACCINE  Completed  . DEXA SCAN  Completed  . PNA vac Low Risk Adult  Completed    Physical Exam: Vitals:   07/19/19 1323  BP: (!) 152/60  Pulse: 90  Temp: (!) 96.8 F (36 C)  SpO2: 97%  Weight: 156 lb 12.8 oz (71.1 kg)  Height: 5\' 3"  (1.6 m)   Body mass index is 27.78 kg/m. Physical Exam Constitutional: Oriented to person, place, and time. Well-developed and well-nourished.  HENT:  Head: Normocephalic. Has Dense Keratitis in Scalp Mouth/Throat: Oropharynx is clear and moist.  Eyes: Pupils are equal, round, and reactive to light.  Neck: Neck supple. Has Dermatitis Rash in the back of the neck Face is also Red around the Cheeks Cardiovascular: Normal rate and normal heart sounds.  No murmur heard. Pulmonary/Chest: Effort normal and breath sounds normal. No respiratory distress. No wheezes. She has no rales.  Abdominal: Soft. Bowel sounds are normal. No distension. There is no tenderness. There is no rebound.  Musculoskeletal: No edema.  Lymphadenopathy: none Neurological: Alert and oriented to person, place, and time. No Focal Deficits Skin: Skin is warm and dry.  Psychiatric: Very Anxious and Upset Labs reviewed: Basic Metabolic Panel: Recent Labs    05/06/19 1031 05/14/19 1623 07/18/19 0720  NA 139 138 141  K 4.3 4.4 3.9  CL 103 101 105  CO2 23 27 24   GLUCOSE 114* 115* 128*  BUN 17 25 17   CREATININE 0.73 0.75 0.72  CALCIUM 9.8 9.8 9.6  TSH  --  2.59 2.81   Liver Function Tests: Recent Labs    05/06/19 1031 05/14/19 1623 07/18/19 0720  AST 17 16 17   ALT 12 11 12   BILITOT 0.7 0.6 0.7  PROT 7.2 7.3 6.6   No results for input(s): LIPASE, AMYLASE in the last 8760  hours. No results for input(s): AMMONIA in the last 8760 hours. CBC: Recent Labs    07/18/19 0720  WBC 9.7  NEUTROABS 3,579  HGB 12.8  HCT 37.6  MCV 92.2  PLT 276   Lipid Panel: Recent Labs    05/06/19 1031  CHOL 164  HDL 50  LDLCALC 87  TRIG 173*  CHOLHDL 3.3   Lab Results  Component Value Date   HGBA1C 6.1 (H) 07/18/2019    Procedures since last visit: No results found.  Assessment/Plan Essential hypertension Continue Lisinopril same dose for now  Controlled type 2 diabetes mellitus  A1C 6.1 On Metformin  Hyperlipidemia associated with type 2 diabetes mellitus ([place]) Continue Lipitor LDL 87 Depression with anxiety Start  on Zoloft 25mg  Rash Betamethasone Apply on Face and Neck Urinary Incontinence UA and Culture was negative Will try next time for Mybetriq    Labs/tests ordered:  * No order type specified * Next appt:  09/13/2019

## 2019-07-26 ENCOUNTER — Other Ambulatory Visit: Payer: Self-pay | Admitting: Nurse Practitioner

## 2019-08-05 ENCOUNTER — Other Ambulatory Visit: Payer: Self-pay | Admitting: Nurse Practitioner

## 2019-08-05 ENCOUNTER — Other Ambulatory Visit: Payer: Self-pay

## 2019-08-05 DIAGNOSIS — R1030 Lower abdominal pain, unspecified: Secondary | ICD-10-CM

## 2019-08-06 ENCOUNTER — Other Ambulatory Visit: Payer: Medicare Other

## 2019-08-06 ENCOUNTER — Other Ambulatory Visit: Payer: Self-pay

## 2019-08-06 DIAGNOSIS — R1030 Lower abdominal pain, unspecified: Secondary | ICD-10-CM | POA: Diagnosis not present

## 2019-08-09 LAB — CBC WITH DIFFERENTIAL/PLATELET
Absolute Monocytes: 996 cells/uL — ABNORMAL HIGH (ref 200–950)
Basophils Absolute: 56 cells/uL (ref 0–200)
Basophils Relative: 0.6 %
Eosinophils Absolute: 150 cells/uL (ref 15–500)
Eosinophils Relative: 1.6 %
HCT: 40.3 % (ref 35.0–45.0)
Hemoglobin: 13.7 g/dL (ref 11.7–15.5)
Lymphs Abs: 3487 cells/uL (ref 850–3900)
MCH: 31.4 pg (ref 27.0–33.0)
MCHC: 34 g/dL (ref 32.0–36.0)
MCV: 92.2 fL (ref 80.0–100.0)
MPV: 11.4 fL (ref 7.5–12.5)
Monocytes Relative: 10.6 %
Neutro Abs: 4709 cells/uL (ref 1500–7800)
Neutrophils Relative %: 50.1 %
Platelets: 311 10*3/uL (ref 140–400)
RBC: 4.37 10*6/uL (ref 3.80–5.10)
RDW: 12.3 % (ref 11.0–15.0)
Total Lymphocyte: 37.1 %
WBC: 9.4 10*3/uL (ref 3.8–10.8)

## 2019-08-09 LAB — URINE CULTURE
MICRO NUMBER:: 10081262
SPECIMEN QUALITY:: ADEQUATE

## 2019-08-09 LAB — EXTRA URINE SPECIMEN

## 2019-08-12 DIAGNOSIS — Z23 Encounter for immunization: Secondary | ICD-10-CM | POA: Diagnosis not present

## 2019-08-15 ENCOUNTER — Encounter: Payer: Self-pay | Admitting: Nurse Practitioner

## 2019-08-15 ENCOUNTER — Non-Acute Institutional Stay: Payer: Medicare Other | Admitting: Nurse Practitioner

## 2019-08-15 ENCOUNTER — Other Ambulatory Visit: Payer: Self-pay

## 2019-08-15 DIAGNOSIS — F338 Other recurrent depressive disorders: Secondary | ICD-10-CM

## 2019-08-15 DIAGNOSIS — I10 Essential (primary) hypertension: Secondary | ICD-10-CM

## 2019-08-15 DIAGNOSIS — R1032 Left lower quadrant pain: Secondary | ICD-10-CM | POA: Diagnosis not present

## 2019-08-15 DIAGNOSIS — N3942 Incontinence without sensory awareness: Secondary | ICD-10-CM

## 2019-08-15 NOTE — Assessment & Plan Note (Signed)
Blood pressure is controlled on Lisinopril 10mg  qd, Atorvastatin 10mg .

## 2019-08-15 NOTE — Patient Instructions (Signed)
The patient's left hip/groin area pain with weight bearing, experienced more pain in morning. Will f/u with Dr. Veverly Fells ASAP.

## 2019-08-15 NOTE — Progress Notes (Signed)
Location:   clinic [facility]   Place of Service:  Clinic (12) Provider: Marlana Latus NP  Code Status: DNR Goals of Care: [place] 11/08/2018  Does Patient Have a Medical Advance Directive? Yes  Type of Advance Directive Living will  Does patient want to make changes to medical advance directive? No - Patient declined  Copy of [place] in Chart? -  Pre-existing out of facility DNR order (yellow form or pink MOST form) -     Chief Complaint  Patient presents with  . Acute Visit    L side groin area pain.     HPI: Patient is a 86 y.o. female seen today for c/o left groin pain with weight bearing and walking, better after moving around  for a month or two, she denied fall or injury, not certain of onset,  Tylenol is not adequate to relieve pain. HTN, blood pressure is controlled on Lisinopril 10mg  qd, Atorvastatin 10mg  qd. Her mood is stable, on Sertraline 25mg  qd. Chronic urinary frequency, denied dysuria,  lower abd or CVA tenderness.    Past Medical History:  Diagnosis Date  . Allergy   . Arthritis   . Diabetes mellitus   . Hernia, abdominal   . Hyperlipidemia   . Hypertension   . Osteopenia, senile 09/26/2012   T score - 1.6 Left femur    Past Surgical History:  Procedure Laterality Date  . APPENDECTOMY    . [place]  . EYE SURGERY    . GUM SURGERY    . KNEE SURGERY      No Known Allergies  Allergies as of 08/15/2019   No Known Allergies     Medication List       Accurate as of August 15, 2019  4:55 PM. If you have any questions, ask your nurse or doctor.        atorvastatin 10 MG tablet Commonly known as: LIPITOR Take 1 tablet (10 mg total) by mouth daily.   betamethasone dipropionate 0.05 % cream Apply topically 2 (two) times daily.   Biotene Moisturizing Mouth Soln Use as directed 5 sprays in the mouth or throat 3 (three) times daily as needed.   Calcium Carbonate-Vitamin D3 600-400 MG-UNIT Tabs Take 1  tablet by mouth 2 (two) times daily.   fish oil-omega-3 fatty acids 1000 MG capsule Take 2 g by mouth daily.   Flax Oil-Fish Oil-Borage Oil Caps Take by mouth daily.   lisinopril 10 MG tablet Commonly known as: ZESTRIL TAKE 1 TABLET BY MOUTH DAILY.   metFORMIN 500 MG tablet Commonly known as: GLUCOPHAGE TAKE 1 TABLET BY MOUTH TWICE DAILY WITH A MEAL.   PRESERVISION AREDS 2+MULTI VIT PO Take 1 tablet by mouth 2 (two) times daily.   sertraline 25 MG tablet Commonly known as: Zoloft Take 1 tablet (25 mg total) by mouth daily.   vitamin C 100 MG tablet Take 100 mg by mouth daily.   Vitamin D-3 125 MCG (5000 UT) Tabs Take by mouth daily.   vitamin E 45 MG (100 UNITS) capsule Take by mouth daily.       Review of Systems:  Review of Systems  Constitutional: Negative for activity change, appetite change, chills, diaphoresis, fatigue and fever.  HENT: Positive for hearing loss. Negative for congestion and voice change.   Eyes: Negative for visual disturbance.  Respiratory: Negative for cough, shortness of breath and wheezing.   Cardiovascular: Negative for chest pain, palpitations  and leg swelling.  Gastrointestinal: Negative for abdominal distention, abdominal pain, constipation, diarrhea, nausea and vomiting.  Genitourinary: Positive for frequency and urgency. Negative for difficulty urinating and dysuria.  Musculoskeletal: Positive for arthralgias and gait problem.       Left hip, groin pain  Skin: Negative for color change and pallor.  Neurological: Negative for dizziness, speech difficulty, weakness and headaches.  Psychiatric/Behavioral: Positive for sleep disturbance. Negative for agitation, behavioral problems and hallucinations. The patient is not nervous/anxious.     Health Maintenance  Topic Date Due  . FOOT EXAM  02/16/2016  . OPHTHALMOLOGY EXAM  04/10/2018  . HEMOGLOBIN A1C  01/15/2020  . TETANUS/TDAP  12/15/2021  . INFLUENZA VACCINE  Completed  . DEXA  SCAN  Completed  . PNA vac Low Risk Adult  Completed    Physical Exam: Vitals:   08/15/19 1535  BP: (!) 144/62  Pulse: 97  Temp: (!) 97.5 F (36.4 C)  SpO2: 97%  Weight: 154 lb 9.6 oz (70.1 kg)  Height: 5\' 3"  (1.6 m)   Body mass index is 27.39 kg/m. Physical Exam Vitals and nursing note reviewed.  Constitutional:      General: She is not in acute distress.    Appearance: Normal appearance. She is not ill-appearing, toxic-appearing or diaphoretic.  HENT:     Head: Normocephalic and atraumatic.     Nose: Nose normal.     Mouth/Throat:     Mouth: Mucous membranes are moist.  Eyes:     Extraocular Movements: Extraocular movements intact.     Conjunctiva/sclera: Conjunctivae normal.     Pupils: Pupils are equal, round, and reactive to light.  Cardiovascular:     Rate and Rhythm: Normal rate and regular rhythm.     Heart sounds: No murmur.  Pulmonary:     Breath sounds: No wheezing, rhonchi or rales.  Abdominal:     General: Bowel sounds are normal. There is no distension.     Palpations: Abdomen is soft.     Tenderness: There is no abdominal tenderness. There is no right CVA tenderness, left CVA tenderness, guarding or rebound.  Musculoskeletal:     Cervical back: Normal range of motion and neck supple.     Right lower leg: No edema.     Left lower leg: No edema.     Comments: Not palpated tenderness left hip, L SIJ, or lumbar spinous process. Ambulates with walker.   Skin:    General: Skin is warm and dry.  Neurological:     General: No focal deficit present.     Mental Status: She is alert. Mental status is at baseline.     Comments: Oriented to person, place.   Psychiatric:        Mood and Affect: Mood normal.        Behavior: Behavior normal.        Thought Content: Thought content normal.     Labs reviewed: Basic Metabolic Panel: Recent Labs    05/06/19 1031 05/14/19 1623 07/18/19 0720  NA 139 138 141  K 4.3 4.4 3.9  CL 103 101 105  CO2 23 27 24     GLUCOSE 114* 115* 128*  BUN 17 25 17   CREATININE 0.73 0.75 0.72  CALCIUM 9.8 9.8 9.6  TSH  --  2.59 2.81   Liver Function Tests: Recent Labs    05/06/19 1031 05/14/19 1623 07/18/19 0720  AST 17 16 17   ALT 12 11 12   BILITOT 0.7 0.6 0.7  PROT  7.2 7.3 6.6   No results for input(s): LIPASE, AMYLASE in the last 8760 hours. No results for input(s): AMMONIA in the last 8760 hours. CBC: Recent Labs    07/18/19 0720 08/06/19 0720  WBC 9.7 9.4  NEUTROABS 3,579 4,709  HGB 12.8 13.7  HCT 37.6 40.3  MCV 92.2 92.2  PLT 276 311   Lipid Panel: Recent Labs    05/06/19 1031  CHOL 164  HDL 50  LDLCALC 87  TRIG 173*  CHOLHDL 3.3   Lab Results  Component Value Date   HGBA1C 6.1 (H) 07/18/2019    Procedures since last visit: No results found.  Assessment/Plan  Left groin pain c/o left hip and groin pain with weight bearing and walking, better after moving around  for a month or two, she denied fall or injury, not certain of its onset, Tylenol is not adequate to relieve pain. The patient saw Dr. Veverly Fells in the past and desires to f/u with him. The patient was recommended to taking OTC Aleve alternating with Tylenol for pain for now.   Hypertension Blood pressure is controlled on Lisinopril 10mg  qd, Atorvastatin 10mg .   Seasonal affective disorder Her mood is stable, continue Sertraline.   Incontinent of urine Urinary frequency q2-3hours, urinary incontinent if not toileting timely, denied dysuria, no suprapubic or CVA tenderness. Urine culture 08/05/19 Proteus Mirabilis, E coli 10,000-49,000c/ml, wbc 9.4, Hgb 13.7, neutrophils 50.1.    Labs/tests ordered:  None  Next appt:  09/13/2019

## 2019-08-15 NOTE — Assessment & Plan Note (Signed)
c/o left hip and groin pain with weight bearing and walking, better after moving around  for a month or two, she denied fall or injury, not certain of its onset, Tylenol is not adequate to relieve pain. The patient saw Dr. Veverly Fells in the past and desires to f/u with him. The patient was recommended to taking OTC Aleve alternating with Tylenol for pain for now.

## 2019-08-15 NOTE — Assessment & Plan Note (Signed)
Her mood is stable, continue Sertraline.  

## 2019-08-15 NOTE — Assessment & Plan Note (Addendum)
Urinary frequency q2-3hours, urinary incontinent if not toileting timely, denied dysuria, no suprapubic or CVA tenderness. Urine culture 08/05/19 Proteus Mirabilis, E coli 10,000-49,000c/ml, wbc 9.4, Hgb 13.7, neutrophils 50.1.

## 2019-08-23 ENCOUNTER — Other Ambulatory Visit: Payer: Self-pay | Admitting: Internal Medicine

## 2019-08-30 ENCOUNTER — Encounter: Payer: Self-pay | Admitting: Internal Medicine

## 2019-09-03 DIAGNOSIS — M7062 Trochanteric bursitis, left hip: Secondary | ICD-10-CM | POA: Diagnosis not present

## 2019-09-03 DIAGNOSIS — M25552 Pain in left hip: Secondary | ICD-10-CM | POA: Diagnosis not present

## 2019-09-13 ENCOUNTER — Other Ambulatory Visit: Payer: Self-pay

## 2019-09-13 ENCOUNTER — Non-Acute Institutional Stay: Payer: Medicare Other | Admitting: Internal Medicine

## 2019-09-13 ENCOUNTER — Telehealth: Payer: Self-pay

## 2019-09-13 ENCOUNTER — Encounter: Payer: Self-pay | Admitting: Internal Medicine

## 2019-09-13 VITALS — BP 146/66 | HR 77 | Temp 97.3°F | Ht 63.0 in | Wt 156.6 lb

## 2019-09-13 DIAGNOSIS — E559 Vitamin D deficiency, unspecified: Secondary | ICD-10-CM | POA: Diagnosis not present

## 2019-09-13 DIAGNOSIS — I10 Essential (primary) hypertension: Secondary | ICD-10-CM | POA: Diagnosis not present

## 2019-09-13 DIAGNOSIS — G3184 Mild cognitive impairment, so stated: Secondary | ICD-10-CM | POA: Diagnosis not present

## 2019-09-13 DIAGNOSIS — N3942 Incontinence without sensory awareness: Secondary | ICD-10-CM

## 2019-09-13 DIAGNOSIS — E1169 Type 2 diabetes mellitus with other specified complication: Secondary | ICD-10-CM

## 2019-09-13 DIAGNOSIS — E785 Hyperlipidemia, unspecified: Secondary | ICD-10-CM

## 2019-09-13 DIAGNOSIS — F418 Other specified anxiety disorders: Secondary | ICD-10-CM

## 2019-09-13 DIAGNOSIS — E538 Deficiency of other specified B group vitamins: Secondary | ICD-10-CM | POA: Diagnosis not present

## 2019-09-13 DIAGNOSIS — E1121 Type 2 diabetes mellitus with diabetic nephropathy: Secondary | ICD-10-CM

## 2019-09-13 MED ORDER — MIRABEGRON ER 25 MG PO TB24: 25.0000 mg | ORAL_TABLET | Freq: Every day | ORAL | 1 refills | Status: DC

## 2019-09-13 NOTE — Telephone Encounter (Signed)
Patient PA in process. Waiting for approval.

## 2019-09-13 NOTE — Progress Notes (Signed)
Location: [facility] of Service:  Clinic (12)  Provider:   Code Status:  Goals of Care:  Advanced Directives 11/08/2018  Does Patient Have a Medical Advance Directive? Yes  Type of Advance Directive Living will  Does patient want to make changes to medical advance directive? No - Patient declined  Copy of [place] in Chart? -  Pre-existing out of facility DNR order (yellow form or pink MOST form) -     Chief Complaint  Patient presents with  . Medical Management of Chronic Issues    6-8 week follow up. Depression medication making her unproductive.    HPI: Patient is a 86 y.o. female seen today for an acute visit for Follow up of her Depression And Urinary Incontinence  Patient has h/o Hypertension, Diabetes Mellitus , Hyperlipidemia and Osteopenia, Left knee arthritis.  Hypertension Doing well with Lisinopril Depression with Anxiety Was started on Zoloft last visit her mood is much better today but she says she feels weak and thinks that Zoloft is doing that. Want to know if she can stop it Urinary Frequency Continues to c/o Incontinence. Seems mixed Stress and urge Arthritis Pain Saw Dr Veverly Fells and was told she has Trochanteric Bursitis  [place] . [place] no family local. No children never been Married. Walks with the [employer]. Independent in her ADLS and IADLS Her POA is her Niece.  Past Medical History:  Diagnosis Date  . Allergy   . Arthritis   . Diabetes mellitus   . Hernia, abdominal   . Hyperlipidemia   . Hypertension   . Osteopenia, senile 09/26/2012   T score - 1.6 Left femur    Past Surgical History:  Procedure Laterality Date  . APPENDECTOMY    . [place]  . EYE SURGERY    . GUM SURGERY    . KNEE SURGERY      No Known Allergies  Outpatient Encounter Medications as of 09/13/2019  Medication Sig  . Artificial Saliva (BIOTENE MOISTURIZING MOUTH) SOLN Use as directed 5  sprays in the mouth or throat 3 (three) times daily as needed.  . Ascorbic Acid (VITAMIN C) 100 MG tablet Take 100 mg by mouth daily.  Marland Kitchen atorvastatin (LIPITOR) 10 MG tablet Take 1 tablet (10 mg total) by mouth daily.  Marland Kitchen augmented betamethasone dipropionate (DIPROLENE-AF) 0.05 % cream APPLY TO AFFECTED AREA TWICE A DAY.  . betamethasone dipropionate 0.05 % cream Apply topically 2 (two) times daily.  . Calcium Carbonate-Vitamin D3 600-400 MG-UNIT TABS Take 1 tablet by mouth 2 (two) times daily.  . Cholecalciferol (VITAMIN D-3) 125 MCG (5000 UT) TABS Take by mouth daily.  . Cyanocobalamin (VITAMIN B-12) 5000 MCG SUBL Place under the tongue daily.  . fish oil-omega-3 fatty acids 1000 MG capsule Take 2 g by mouth daily.  . Flax Oil-Fish Oil-Borage Oil CAPS Take by mouth daily.  Marland Kitchen lisinopril (ZESTRIL) 10 MG tablet TAKE 1 TABLET BY MOUTH DAILY.  . metFORMIN (GLUCOPHAGE) 500 MG tablet TAKE 1 TABLET BY MOUTH TWICE DAILY WITH A MEAL.  . Multiple Vitamins-Minerals (PRESERVISION AREDS 2+MULTI VIT PO) Take 1 tablet by mouth 2 (two) times daily.  . sertraline (ZOLOFT) 25 MG tablet Take 1 tablet (25 mg total) by mouth daily.  . vitamin E 100 UNIT capsule Take by mouth daily.   No facility-administered encounter medications on file as of 09/13/2019.    Review of Systems:  Review of Systems  Review of Systems  Constitutional: Negative for activity change, appetite change, chills, diaphoresis, fatigue and fever.  HENT: Negative for mouth sores, postnasal drip, rhinorrhea, sinus pain and sore throat.   Respiratory: Negative for apnea, cough, chest tightness, shortness of breath and wheezing.   Cardiovascular: Negative for chest pain, palpitations and leg swelling.  Gastrointestinal: Negative for abdominal distention, abdominal pain, constipation, diarrhea, nausea and vomiting.  Genitourinary: Negative for dysuria   Musculoskeletal: Negative for arthralgias, joint swelling and myalgias.  Skin: Negative for  rash.  Neurological: Negative for dizziness, syncope, weakness, light-headedness and numbness.  Psychiatric/Behavioral: Negative for behavioral problems, confusion and sleep disturbance.     Health Maintenance  Topic Date Due  . FOOT EXAM  02/16/2016  . OPHTHALMOLOGY EXAM  04/10/2018  . HEMOGLOBIN A1C  01/15/2020  . TETANUS/TDAP  12/15/2021  . INFLUENZA VACCINE  Completed  . DEXA SCAN  Completed  . PNA vac Low Risk Adult  Completed    Physical Exam: Vitals:   09/13/19 1016  BP: (!) 146/66  Pulse: 77  Temp: (!) 97.3 F (36.3 C)  SpO2: 98%  Weight: 156 lb 9.6 oz (71 kg)  Height: 5\' 3"  (1.6 m)   Body mass index is 27.74 kg/m. Physical Exam  Constitutional: Oriented to person, place, and time. Well-developed and well-nourished.  HENT:  Head: Normocephalic.  Mouth/Throat: Oropharynx is clear and moist.  Eyes: Pupils are equal, round, and reactive to light.  Neck: Neck supple.  Cardiovascular: Normal rate and normal heart sounds.  No murmur heard. Pulmonary/Chest: Effort normal and breath sounds normal. No respiratory distress. No wheezes. She has no rales.  Abdominal: Soft. Bowel sounds are normal. No distension. There is no tenderness. There is no rebound.  Musculoskeletal: No edema.  Lymphadenopathy: none Neurological: Alert and oriented to person, place, and time.  No Focal Deficits Skin: Skin is warm and dry.  Psychiatric: Normal mood and affect. Behavior is normal. Thought content normal.    Labs reviewed: Basic Metabolic Panel: Recent Labs    05/06/19 1031 05/14/19 1623 07/18/19 0720  NA 139 138 141  K 4.3 4.4 3.9  CL 103 101 105  CO2 23 27 24   GLUCOSE 114* 115* 128*  BUN 17 25 17   CREATININE 0.73 0.75 0.72  CALCIUM 9.8 9.8 9.6  TSH  --  2.59 2.81   Liver Function Tests: Recent Labs    05/06/19 1031 05/14/19 1623 07/18/19 0720  AST 17 16 17   ALT 12 11 12   BILITOT 0.7 0.6 0.7  PROT 7.2 7.3 6.6   No results for input(s): LIPASE, AMYLASE in  the last 8760 hours. No results for input(s): AMMONIA in the last 8760 hours. CBC: Recent Labs    07/18/19 0720 08/06/19 0720  WBC 9.7 9.4  NEUTROABS 3,579 4,709  HGB 12.8 13.7  HCT 37.6 40.3  MCV 92.2 92.2  PLT 276 311   Lipid Panel: Recent Labs    05/06/19 1031  CHOL 164  HDL 50  LDLCALC 87  TRIG 173*  CHOLHDL 3.3   Lab Results  Component Value Date   HGBA1C 6.1 (H) 07/18/2019    Procedures since last visit: No results found.  Assessment/Plan  Essential hypertension Continue Lisinopril  Urinary incontinence  Will start her on Myrbetriq 25 mg  Controlled type 2 diabetes mellitus Controlled on Metformin Hyperlipidemia  Continue Lipitor Depression with anxiety Discontinue Zoloft per patients request  Mild Cognitive Impairnemanet MMSE 24/30 Failed Clock Drawing  Agrees For [employer]  next visit. Wants to wait  Does get Eye exam every year Need Foot exam next visit Bone Density 05/2017 -1.9  Labs/tests ordered:  * No order type specified * Next appt:  Visit date not found  Total time spent in this patient care encounter was 45 _  minutes; greater than 50% of the visit spent counseling patient and staff, reviewing records , Labs and coordinating care for problems addressed at this encounter.

## 2019-09-14 DIAGNOSIS — F418 Other specified anxiety disorders: Secondary | ICD-10-CM | POA: Insufficient documentation

## 2019-09-30 ENCOUNTER — Other Ambulatory Visit: Payer: Self-pay | Admitting: Internal Medicine

## 2019-09-30 NOTE — Telephone Encounter (Signed)
Confirmed with MA PA was initiated. [employer], spoke with Elmyra Ricks who states the patient is not a member in their system and would need a part D plan to cover medications. Called patient to see if she has a sperate plan to cover medications.  Called the patient who states she also has a Web designer ph. [phone]. Member number [phone]  Called number but it was to [facility]. Called patient wo gave me a [facility] info [phone] [facility]. Called and spoke with Alphenia. Reject 76 plans limitation exceeded. Alphenia states she will call the senior line and call back.

## 2019-09-30 NOTE — Telephone Encounter (Signed)
Patient states that she received a call from [facility] that the insurance did not approve the request for Myrbetriq.  Patient is unsure why the prescription was not approved. Please advise patient regarding the prescription request.  [facility] can be reached at [phone].  Thank you  Adan Sis

## 2019-09-30 NOTE — Telephone Encounter (Signed)
[place], spoke with Abigail Butts who states it requires a prior [place]. Fax have been sent with no success starting  3/5 according to [place]. Documentation  shows waiting for approval but pharmacy states they aren't able to give it to the patient and it started on the 5th. She will fax another prior auth. Form.

## 2019-10-01 NOTE — Telephone Encounter (Signed)
Completed verbal PA with [place] at [phone] [employer].   Prescription was approved and fax with be sent over to show approval according to [place].

## 2019-10-02 ENCOUNTER — Telehealth: Payer: Self-pay | Admitting: *Deleted

## 2019-10-02 NOTE — Telephone Encounter (Signed)
Received fax from [facility] [phone] Stating that even with Prior Auth patient's copay for Myrbetriq is still $80.53. Patient is requesting cheaper alternative (Ditropan XL, etc) Please Advise.

## 2019-10-03 MED ORDER — OXYBUTYNIN CHLORIDE ER 5 MG PO TB24: 5.0000 mg | ORAL_TABLET | Freq: Every day | ORAL | 1 refills | Status: DC

## 2019-10-03 NOTE — Telephone Encounter (Signed)
Patient notified and agreed.  Appointment scheduled.  

## 2019-10-03 NOTE — Telephone Encounter (Signed)
I will order Ditropan XL for her. She needs to come for Follow up with me in 4 weeks.

## 2019-10-14 ENCOUNTER — Other Ambulatory Visit: Payer: Self-pay | Admitting: Internal Medicine

## 2019-10-30 ENCOUNTER — Other Ambulatory Visit: Payer: Self-pay | Admitting: Internal Medicine

## 2019-11-08 ENCOUNTER — Non-Acute Institutional Stay: Payer: Medicare Other | Admitting: Internal Medicine

## 2019-11-08 ENCOUNTER — Encounter: Payer: Self-pay | Admitting: Internal Medicine

## 2019-11-08 ENCOUNTER — Other Ambulatory Visit: Payer: Self-pay

## 2019-11-08 VITALS — BP 144/70 | HR 77 | Temp 97.1°F | Ht 63.0 in | Wt 155.6 lb

## 2019-11-08 DIAGNOSIS — N3942 Incontinence without sensory awareness: Secondary | ICD-10-CM | POA: Diagnosis not present

## 2019-11-08 DIAGNOSIS — E538 Deficiency of other specified B group vitamins: Secondary | ICD-10-CM

## 2019-11-08 DIAGNOSIS — E785 Hyperlipidemia, unspecified: Secondary | ICD-10-CM | POA: Diagnosis not present

## 2019-11-08 DIAGNOSIS — E1169 Type 2 diabetes mellitus with other specified complication: Secondary | ICD-10-CM

## 2019-11-08 DIAGNOSIS — I10 Essential (primary) hypertension: Secondary | ICD-10-CM | POA: Diagnosis not present

## 2019-11-08 DIAGNOSIS — G3184 Mild cognitive impairment, so stated: Secondary | ICD-10-CM

## 2019-11-08 DIAGNOSIS — E1121 Type 2 diabetes mellitus with diabetic nephropathy: Secondary | ICD-10-CM | POA: Diagnosis not present

## 2019-11-08 DIAGNOSIS — F418 Other specified anxiety disorders: Secondary | ICD-10-CM

## 2019-11-08 NOTE — Progress Notes (Signed)
Location: [place] of Service:  Clinic (12)  Provider:   Code Status:  Goals of Care:  Advanced Directives 11/08/2018  Does Patient Have a Medical Advance Directive? Yes  Type of Advance Directive Living will  Does patient want to make changes to medical advance directive? No - Patient declined  Copy of [place] in Chart? -  Pre-existing out of facility DNR order (yellow form or pink MOST form) -     Chief Complaint  Patient presents with  . Medical Management of Chronic Issues    Patient would like to discuss her frequent urination. The oxybutynin doesn't seem to be helping her.  . Health Maintenance    Foot and eye exam    HPI: Patient is a 86 y.o. female seen today for an acute visit for Follow up of her Incontinence and depression  Patient has h/o Hypertension, Diabetes Mellitus , Hyperlipidemia and Osteopenia, Left knee arthritis.  Urinary frequency Has both stress and urge symptoms.  I have prescribed her vitamin D but she had a high co-pay so she does not want to do that we changed it to Ditropan XL 5 mg. She states her symptoms are improved during the daytime but at night she has to still get up many times.  Does complain of worsening of her dry mouth. Depression Had failed Zoloft before as she thought it made her feel fatigued. She does not want to take anything right now because she feels that she can manage as there has been easing in the restrictions in the facility.  No other complaints today No recent falls. [place] . [place] no family local. No children never been Married. Walks with the [employer]. Independent in her ADLS and IADLS Her POA is her Niece. Past Medical History:  Diagnosis Date  . Allergy   . Arthritis   . Diabetes mellitus   . Hernia, abdominal   . Hyperlipidemia   . Hypertension   . Osteopenia, senile 09/26/2012   T score - 1.6 Left femur    Past Surgical History:    Procedure Laterality Date  . APPENDECTOMY    . [place]  . EYE SURGERY    . GUM SURGERY    . KNEE SURGERY      No Known Allergies  Outpatient Encounter Medications as of 11/08/2019  Medication Sig  . Ascorbic Acid (VITAMIN C) 100 MG tablet Take 100 mg by mouth daily.  Marland Kitchen atorvastatin (LIPITOR) 10 MG tablet TAKE 1 TABLET ONCE DAILY.  Marland Kitchen augmented betamethasone dipropionate (DIPROLENE-AF) 0.05 % cream APPLY TO AFFECTED AREA TWICE A DAY.  . betamethasone dipropionate 0.05 % cream Apply topically 2 (two) times daily.  . Calcium Carbonate-Vitamin D3 600-400 MG-UNIT TABS Take 1 tablet by mouth 2 (two) times daily.  . Cholecalciferol (VITAMIN D-3) 125 MCG (5000 UT) TABS Take by mouth daily.  . Cyanocobalamin (VITAMIN B-12) 5000 MCG SUBL Place under the tongue daily.  . fish oil-omega-3 fatty acids 1000 MG capsule Take 2 g by mouth daily.  . Flax Oil-Fish Oil-Borage Oil CAPS Take by mouth daily.  Marland Kitchen lisinopril (ZESTRIL) 10 MG tablet TAKE 1 TABLET BY MOUTH DAILY.  . metFORMIN (GLUCOPHAGE) 500 MG tablet TAKE 1 TABLET BY MOUTH TWICE DAILY WITH A MEAL.  . mirabegron ER (MYRBETRIQ) 25 MG TB24 tablet Take 1 tablet (25 mg total) by mouth daily.  . Multiple Vitamins-Minerals (PRESERVISION AREDS 2+MULTI  VIT PO) Take 1 tablet by mouth 2 (two) times daily.  Marland Kitchen oxybutynin (DITROPAN XL) 5 MG 24 hr tablet Take 1 tablet (5 mg total) by mouth at bedtime.  . vitamin E 100 UNIT capsule Take by mouth daily.  . Artificial Saliva (BIOTENE MOISTURIZING MOUTH) SOLN Use as directed 5 sprays in the mouth or throat 3 (three) times daily as needed. (Patient not taking: Reported on 11/08/2019)   No facility-administered encounter medications on file as of 11/08/2019.    Review of Systems:  Review of Systems  Constitutional: Negative.   HENT:       Dry mouth  Respiratory: Negative.   Cardiovascular: Negative.   Gastrointestinal: Negative.   Genitourinary: Positive for frequency and urgency.   Musculoskeletal: Negative.   Neurological: Negative.   Psychiatric/Behavioral: Positive for dysphoric mood.  All other systems reviewed and are negative.   Health Maintenance  Topic Date Due  . FOOT EXAM  02/16/2016  . OPHTHALMOLOGY EXAM  04/10/2018  . HEMOGLOBIN A1C  01/15/2020  . INFLUENZA VACCINE  02/09/2020  . TETANUS/TDAP  12/15/2021  . DEXA SCAN  Completed  . COVID-19 Vaccine  Completed  . PNA vac Low Risk Adult  Completed    Physical Exam: Vitals:   11/08/19 1121  BP: (!) 144/70  Pulse: 77  Temp: (!) 97.1 F (36.2 C)  SpO2: 97%  Weight: 155 lb 9.6 oz (70.6 kg)  Height: 5\' 3"  (1.6 m)   Body mass index is 27.56 kg/m. Physical Exam  Constitutional: Oriented to person, place, and time. Well-developed and well-nourished.  HENT:  Head: Normocephalic.  Mouth/Throat: Oropharynx is clear and moist.  Eyes: Pupils are equal, round, and reactive to light.  Neck: Neck supple.  Cardiovascular: Normal rate and normal heart sounds.  No murmur heard. Pulmonary/Chest: Effort normal and breath sounds normal. No respiratory distress. No wheezes. She has no rales.  Abdominal: Soft. Bowel sounds are normal. No distension. There is no tenderness. There is no rebound.  Musculoskeletal: No edema.  Foot Exam No Open wounds Sensation was normal. DP pulses Palpable Lymphadenopathy: none Neurological: Alert and oriented to person, place, and time.  Skin: Skin is warm and dry.  Psychiatric: Normal mood and affect. Behavior is normal. Thought content normal.    Labs reviewed: Basic Metabolic Panel: Recent Labs    05/06/19 1031 05/14/19 1623 07/18/19 0720  NA 139 138 141  K 4.3 4.4 3.9  CL 103 101 105  CO2 23 27 24   GLUCOSE 114* 115* 128*  BUN 17 25 17   CREATININE 0.73 0.75 0.72  CALCIUM 9.8 9.8 9.6  TSH  --  2.59 2.81   Liver Function Tests: Recent Labs    05/06/19 1031 05/14/19 1623 07/18/19 0720  AST 17 16 17   ALT 12 11 12   BILITOT 0.7 0.6 0.7  PROT 7.2 7.3  6.6   No results for input(s): LIPASE, AMYLASE in the last 8760 hours. No results for input(s): AMMONIA in the last 8760 hours. CBC: Recent Labs    07/18/19 0720 08/06/19 0720  WBC 9.7 9.4  NEUTROABS 3,579 4,709  HGB 12.8 13.7  HCT 37.6 40.3  MCV 92.2 92.2  PLT 276 311   Lipid Panel: Recent Labs    05/06/19 1031  CHOL 164  HDL 50  LDLCALC 87  TRIG 173*  CHOLHDL 3.3   Lab Results  Component Value Date   HGBA1C 6.1 (H) 07/18/2019    Procedures since last visit: No results found.  Assessment/Plan Urinary incontinence  Continue Ditropan Told her to take it early in day to see if it helps at night C/O Some Dry Mouth Will not increase the dose right now  Essential hypertension Stable on Lisinopril  Controlled type 2 diabetes mellitus  Controlled on Metformin (HCC)  Hyperlipidemia associated with type 2 diabetes mellitus ([place]) LDL less then 100 Depression with anxiety Does not want treatment right now Mild cognitive impairment MMSE 24/30 Failed Clock Drawing Vitamin B12 deficiency Check Level Will call Pharmacy to get her Shingrix Up to date on her Eye exam Bone Density 05/2017 -1.9   Labs/tests ordered:  * No order type specified * Next appt:  12/10/2019  Total time spent in this patient care encounter was  45_  minutes; greater than 50% of the visit spent counseling patient and staff, reviewing records , Labs and coordinating care for problems addressed at this encounter.

## 2019-12-10 ENCOUNTER — Other Ambulatory Visit: Payer: Self-pay

## 2019-12-10 DIAGNOSIS — E538 Deficiency of other specified B group vitamins: Secondary | ICD-10-CM | POA: Diagnosis not present

## 2019-12-10 DIAGNOSIS — E1169 Type 2 diabetes mellitus with other specified complication: Secondary | ICD-10-CM

## 2019-12-10 DIAGNOSIS — E1121 Type 2 diabetes mellitus with diabetic nephropathy: Secondary | ICD-10-CM | POA: Diagnosis not present

## 2019-12-10 DIAGNOSIS — I10 Essential (primary) hypertension: Secondary | ICD-10-CM

## 2019-12-10 DIAGNOSIS — E559 Vitamin D deficiency, unspecified: Secondary | ICD-10-CM | POA: Diagnosis not present

## 2019-12-10 DIAGNOSIS — E785 Hyperlipidemia, unspecified: Secondary | ICD-10-CM

## 2019-12-11 LAB — CBC WITH DIFFERENTIAL/PLATELET
Absolute Monocytes: 836 cells/uL (ref 200–950)
Basophils Absolute: 53 cells/uL (ref 0–200)
Basophils Relative: 0.6 %
Eosinophils Absolute: 185 cells/uL (ref 15–500)
Eosinophils Relative: 2.1 %
HCT: 39.6 % (ref 35.0–45.0)
Hemoglobin: 13.4 g/dL (ref 11.7–15.5)
Lymphs Abs: 3846 cells/uL (ref 850–3900)
MCH: 31.8 pg (ref 27.0–33.0)
MCHC: 33.8 g/dL (ref 32.0–36.0)
MCV: 93.8 fL (ref 80.0–100.0)
MPV: 11.5 fL (ref 7.5–12.5)
Monocytes Relative: 9.5 %
Neutro Abs: 3881 cells/uL (ref 1500–7800)
Neutrophils Relative %: 44.1 %
Platelets: 280 10*3/uL (ref 140–400)
RBC: 4.22 10*6/uL (ref 3.80–5.10)
RDW: 12.2 % (ref 11.0–15.0)
Total Lymphocyte: 43.7 %
WBC: 8.8 10*3/uL (ref 3.8–10.8)

## 2019-12-11 LAB — HEMOGLOBIN A1C
Hgb A1c MFr Bld: 5.9 % of total Hgb — ABNORMAL HIGH (ref <5.7)
Mean Plasma Glucose: 123 (calc)
eAG (mmol/L): 6.8 (calc)

## 2019-12-11 LAB — COMPLETE METABOLIC PANEL WITH GFR
AG Ratio: 1.8 (calc) (ref 1.0–2.5)
ALT: 10 U/L (ref 6–29)
AST: 15 U/L (ref 10–35)
Albumin: 4.2 g/dL (ref 3.6–5.1)
Alkaline phosphatase (APISO): 56 U/L (ref 37–153)
BUN: 19 mg/dL (ref 7–25)
CO2: 25 mmol/L (ref 20–32)
Calcium: 9.5 mg/dL (ref 8.6–10.4)
Chloride: 104 mmol/L (ref 98–110)
Creat: 0.81 mg/dL (ref 0.60–0.88)
GFR, Est African American: 71 mL/min/{1.73_m2} (ref >=60)
GFR, Est Non African American: 61 mL/min/{1.73_m2} (ref >=60)
Globulin: 2.3 g/dL (calc) (ref 1.9–3.7)
Glucose, Bld: 121 mg/dL — ABNORMAL HIGH (ref 65–99)
Potassium: 4.6 mmol/L (ref 3.5–5.3)
Sodium: 139 mmol/L (ref 135–146)
Total Bilirubin: 0.8 mg/dL (ref 0.2–1.2)
Total Protein: 6.5 g/dL (ref 6.1–8.1)

## 2019-12-11 LAB — LIPID PANEL
Cholesterol: 165 mg/dL (ref <200)
HDL: 46 mg/dL — ABNORMAL LOW (ref >=50)
LDL Cholesterol (Calc): 87 mg/dL (calc)
Non-HDL Cholesterol (Calc): 119 mg/dL (calc) (ref <130)
Total CHOL/HDL Ratio: 3.6 (calc) (ref <5.0)
Triglycerides: 219 mg/dL — ABNORMAL HIGH (ref <150)

## 2019-12-11 LAB — VITAMIN D 25 HYDROXY (VIT D DEFICIENCY, FRACTURES): Vit D, 25-Hydroxy: 58 ng/mL (ref 30–100)

## 2019-12-11 LAB — VITAMIN B12: Vitamin B-12: 657 pg/mL (ref 200–1100)

## 2019-12-13 ENCOUNTER — Encounter: Payer: Self-pay | Admitting: Internal Medicine

## 2019-12-16 ENCOUNTER — Other Ambulatory Visit: Payer: Self-pay | Admitting: Internal Medicine

## 2020-01-22 ENCOUNTER — Other Ambulatory Visit: Payer: Self-pay | Admitting: Internal Medicine

## 2020-02-28 ENCOUNTER — Non-Acute Institutional Stay: Payer: Medicare Other | Admitting: Internal Medicine

## 2020-02-28 ENCOUNTER — Other Ambulatory Visit: Payer: Self-pay

## 2020-02-28 ENCOUNTER — Encounter: Payer: Self-pay | Admitting: Internal Medicine

## 2020-02-28 VITALS — BP 136/64 | HR 89 | Temp 97.8°F | Ht 63.0 in | Wt 159.2 lb

## 2020-02-28 DIAGNOSIS — E559 Vitamin D deficiency, unspecified: Secondary | ICD-10-CM

## 2020-02-28 DIAGNOSIS — R682 Dry mouth, unspecified: Secondary | ICD-10-CM

## 2020-02-28 DIAGNOSIS — F418 Other specified anxiety disorders: Secondary | ICD-10-CM

## 2020-02-28 DIAGNOSIS — E1169 Type 2 diabetes mellitus with other specified complication: Secondary | ICD-10-CM

## 2020-02-28 DIAGNOSIS — G3184 Mild cognitive impairment, so stated: Secondary | ICD-10-CM

## 2020-02-28 DIAGNOSIS — N3942 Incontinence without sensory awareness: Secondary | ICD-10-CM | POA: Diagnosis not present

## 2020-02-28 DIAGNOSIS — I10 Essential (primary) hypertension: Secondary | ICD-10-CM

## 2020-02-28 DIAGNOSIS — E1121 Type 2 diabetes mellitus with diabetic nephropathy: Secondary | ICD-10-CM

## 2020-02-28 DIAGNOSIS — E538 Deficiency of other specified B group vitamins: Secondary | ICD-10-CM | POA: Diagnosis not present

## 2020-02-28 DIAGNOSIS — E785 Hyperlipidemia, unspecified: Secondary | ICD-10-CM

## 2020-02-28 NOTE — Progress Notes (Signed)
Location:  [place] of Service:  Clinic (12)  Provider:   Code Status:  Goals of Care:  Advanced Directives 11/08/2018  Does Patient Have a Medical Advance Directive? Yes  Type of Advance Directive Living will  Does patient want to make changes to medical advance directive? No - Patient declined  Copy of [place] in Chart? -  Pre-existing out of facility DNR order (yellow form or pink MOST form) -     Chief Complaint  Patient presents with  . Medical Management of Chronic Issues    Patient returns to the clinic for her 3 month follow up.    HPI: Patient is a 86 y.o. female seen today for medical management of chronic diseases.    Patient has h/o Hypertension, Diabetes Mellitus , Hyperlipidemia and Osteopenia, Left knee arthritis Urinary frequency Patient has a history of both stress and her symptoms.  She could not afford Myrebetriq Started her on Ditropan 5 mg.  But patient stopped because it did not help.  Wants to know if I can increase the dose.  Patient does not want to see a urologist at this time Dry mouth Has been Using hydrogen peroxide Depression She states she feels much better since the restrictions have been removed.  Patient is doing well overall.  She has started doing swimming exercises.  [place] . Has no family local. No children never been Married. Walks with the [employer]. Independent in her ADLS and IADLS Her POA is her Niece.  Past Medical History:  Diagnosis Date  . Allergy   . Arthritis   . Diabetes mellitus   . Hernia, abdominal   . Hyperlipidemia   . Hypertension   . Osteopenia, senile 09/26/2012   T score - 1.6 Left femur    Past Surgical History:  Procedure Laterality Date  . APPENDECTOMY    . [place]  . EYE SURGERY    . GUM SURGERY    . KNEE SURGERY      No Known Allergies  Outpatient Encounter Medications as of 02/28/2020  Medication Sig  .  Artificial Saliva (BIOTENE MOISTURIZING MOUTH) SOLN Use as directed 5 sprays in the mouth or throat 3 (three) times daily as needed.  . Ascorbic Acid (VITAMIN C) 100 MG tablet Take 100 mg by mouth daily.  Marland Kitchen atorvastatin (LIPITOR) 10 MG tablet TAKE 1 TABLET ONCE DAILY.  Marland Kitchen augmented betamethasone dipropionate (DIPROLENE-AF) 0.05 % cream APPLY TO AFFECTED AREA TWICE A DAY.  . betamethasone dipropionate 0.05 % cream Apply topically 2 (two) times daily.  . Calcium Carbonate-Vitamin D3 600-400 MG-UNIT TABS Take 1 tablet by mouth 2 (two) times daily.  . Cholecalciferol (VITAMIN D-3) 125 MCG (5000 UT) TABS Take by mouth daily.  . Cyanocobalamin (VITAMIN B-12) 5000 MCG SUBL Place under the tongue daily.  . fish oil-omega-3 fatty acids 1000 MG capsule Take 2 g by mouth daily.  . Flax Oil-Fish Oil-Borage Oil CAPS Take by mouth daily.  Marland Kitchen lisinopril (ZESTRIL) 10 MG tablet TAKE 1 TABLET BY MOUTH DAILY.  . metFORMIN (GLUCOPHAGE) 500 MG tablet TAKE 1 TABLET BY MOUTH TWICE DAILY WITH A MEAL.  . Multiple Vitamins-Minerals (PRESERVISION AREDS 2+MULTI VIT PO) Take 1 tablet by mouth 2 (two) times daily.  . vitamin E 100 UNIT capsule Take by mouth daily.  . [DISCONTINUED] oxybutynin (DITROPAN-XL) 5 MG 24 hr tablet TAKE ONE TABLET AT BEDTIME. (Patient not  taking: Reported on 02/28/2020)   No facility-administered encounter medications on file as of 02/28/2020.    Review of Systems:  Review of Systems  Review of Systems  Constitutional: Negative for activity change, appetite change, chills, diaphoresis, fatigue and fever.  HENT: Negative for mouth sores, postnasal drip, rhinorrhea, sinus pain and sore throat.   Respiratory: Negative for apnea, cough, chest tightness, shortness of breath and wheezing.   Cardiovascular: Negative for chest pain, palpitations and leg swelling.  Gastrointestinal: Negative for abdominal distention, abdominal pain, constipation, diarrhea, nausea and vomiting.  Genitourinary: Negative for  dysuria and frequency.  Musculoskeletal: Negative for arthralgias, joint swelling and myalgias.  Skin: Negative for rash.  Neurological: Negative for dizziness, syncope, weakness, light-headedness and numbness.  Psychiatric/Behavioral: Negative for behavioral problems, confusion and sleep disturbance.     Health Maintenance  Topic Date Due  . INFLUENZA VACCINE  02/09/2020  . HEMOGLOBIN A1C  06/10/2020  . OPHTHALMOLOGY EXAM  08/20/2020  . FOOT EXAM  11/07/2020  . TETANUS/TDAP  12/15/2021  . DEXA SCAN  Completed  . COVID-19 Vaccine  Completed  . PNA vac Low Risk Adult  Completed    Physical Exam: Vitals:   02/28/20 1129  BP: 136/64  Pulse: 89  Temp: 97.8 F (36.6 C)  SpO2: 96%  Weight: 159 lb 3.2 oz (72.2 kg)  Height: 5\' 3"  (1.6 m)   Body mass index is 28.2 kg/m. Physical Exam  Constitutional: Oriented to person, place, and time. Well-developed and well-nourished.  HENT:  Head: Normocephalic.  Mouth/Throat: Oropharynx is clear and moist.  Eyes: Pupils are equal, round, and reactive to light.  Neck: Neck supple.  Has Ganglion in back of Left ear Cardiovascular: Normal rate and normal heart sounds.  No murmur heard. Pulmonary/Chest: Effort normal and breath sounds normal. No respiratory distress. No wheezes. She has no rales.  Abdominal: Soft. Bowel sounds are normal. No distension. There is no tenderness. There is no rebound.  Musculoskeletal: No edema.  Lymphadenopathy: none Neurological: Alert and oriented to person, place, and time.  Skin: Skin is warm and dry.  Psychiatric: Normal mood and affect. Behavior is normal. Thought content normal.    Labs reviewed: Basic Metabolic Panel: Recent Labs    05/14/19 1623 07/18/19 0720 12/10/19 0710  NA 138 141 139  K 4.4 3.9 4.6  CL 101 105 104  CO2 27 24 25   GLUCOSE 115* 128* 121*  BUN 25 17 19   CREATININE 0.75 0.72 0.81  CALCIUM 9.8 9.6 9.5  TSH 2.59 2.81  --    Liver Function Tests: Recent Labs     05/14/19 1623 07/18/19 0720 12/10/19 0710  AST 16 17 15   ALT 11 12 10   BILITOT 0.6 0.7 0.8  PROT 7.3 6.6 6.5   No results for input(s): LIPASE, AMYLASE in the last 8760 hours. No results for input(s): AMMONIA in the last 8760 hours. CBC: Recent Labs    07/18/19 0720 08/06/19 0720 12/10/19 0710  WBC 9.7 9.4 8.8  NEUTROABS 3,579 4,709 3,881  HGB 12.8 13.7 13.4  HCT 37.6 40.3 39.6  MCV 92.2 92.2 93.8  PLT 276 311 280   Lipid Panel: Recent Labs    05/06/19 1031 12/10/19 0710  CHOL 164 165  HDL 50 46*  LDLCALC 87 87  TRIG 173* 219*  CHOLHDL 3.3 3.6   Lab Results  Component Value Date   HGBA1C 5.9 (H) 12/10/2019    Procedures since last visit: No results found.  Assessment/Plan Urinary incontinence without sensory  awareness Increase Ditropan to 10 mg Does not want to see Urologuist She will try and see if increased dose helps Discussed with the patient that it can cause dry mouth Essential hypertension Continue Lisinopril   Controlled type 2 diabetes mellitus with diabetic nephropathy, without long-term current use of insulin (HCC) A1C good on Metformin  Hyperlipidemia associated with type 2 diabetes mellitus (HCC) LDL less then 100 Continue Statin Depression with anxiety Does not want anything rihgt now  Mild cognitive impairment Continue to monitor Vitamin B12 deficiency Levels Good Vitamin D deficiency Levels good Dry mouth Discontinue Hydrogen Peroxide Biotene PRn Ganglion Left Ear She will follow with Dermatology  Again Told to Get Shingrix  Labs/tests ordered:  * No order type specified * Next appt:  06/18/2020

## 2020-02-29 MED ORDER — OXYBUTYNIN CHLORIDE ER 10 MG PO TB24: 10.0000 mg | ORAL_TABLET | Freq: Every day | ORAL | 3 refills | Status: DC

## 2020-04-09 ENCOUNTER — Encounter: Payer: Self-pay | Admitting: Nurse Practitioner

## 2020-04-09 ENCOUNTER — Other Ambulatory Visit: Payer: Self-pay

## 2020-04-09 ENCOUNTER — Non-Acute Institutional Stay: Payer: Medicare Other | Admitting: Nurse Practitioner

## 2020-04-09 DIAGNOSIS — F418 Other specified anxiety disorders: Secondary | ICD-10-CM | POA: Diagnosis not present

## 2020-04-09 DIAGNOSIS — N3942 Incontinence without sensory awareness: Secondary | ICD-10-CM

## 2020-04-09 DIAGNOSIS — E1121 Type 2 diabetes mellitus with diabetic nephropathy: Secondary | ICD-10-CM | POA: Diagnosis not present

## 2020-04-09 DIAGNOSIS — I10 Essential (primary) hypertension: Secondary | ICD-10-CM | POA: Diagnosis not present

## 2020-04-09 DIAGNOSIS — M5431 Sciatica, right side: Secondary | ICD-10-CM

## 2020-04-09 NOTE — Assessment & Plan Note (Signed)
Persisted, desires no mood stabilizer.

## 2020-04-09 NOTE — Assessment & Plan Note (Signed)
Blood pressure is controlled, continue Lisinopril.  

## 2020-04-09 NOTE — Progress Notes (Signed)
 Location:   clinic [facility]   Place of Service:  Clinic (12) Provider: Xie  NP  Code Status: DNR Goals of Care: [place] Advanced Directives 11/08/2018  Does Patient Have a Medical Advance Directive? Yes  Type of Advance Directive Living will  Does patient want to make changes to medical advance directive? No - Patient declined  Copy of Healthcare Power of Attorney in Chart? -  Pre-existing out of facility DNR order (yellow form or pink MOST form) -     Chief Complaint  Patient presents with  . Acute Visit    Patient returns to the clinic complaining of eye issues.     HPI: Patient is a 86 y.o. female seen today for medical management of chronic diseases.    HTN, blood pressure is controlled on Lisinopril 10mg qd, Atorvastatin 10mg qd.   Her mood has no change, not taking mood stabilizer.   T2DM, takes Metformin, last Hgb a1c 6.0, concerns of renal function.   Urinary frequency, better,  takes Oxybutynin, increased to 10mg qd 02/28/20, but the patient stopped taking it due to dry eyes, mouth, blurred vision.   Lower back pain, right sciatica, comes and goes for over a year.    Past Medical History:  Diagnosis Date  . Allergy   . Arthritis   . Diabetes mellitus   . Hernia, abdominal   . Hyperlipidemia   . Hypertension   . Osteopenia, senile 09/26/2012   T score - 1.6 Left femur    Past Surgical History:  Procedure Laterality Date  . APPENDECTOMY    . BACK SURGERY     1965  . EYE SURGERY    . GUM SURGERY    . KNEE SURGERY      No Known Allergies  Allergies as of 04/09/2020   No Known Allergies     Medication List       Accurate as of April 09, 2020 11:59 PM. If you have any questions, ask your nurse or doctor.        STOP taking these medications   oxybutynin 10 MG 24 hr tablet Commonly known as: Ditropan XL Stopped by:  X , NP     TAKE these medications   atorvastatin 10 MG tablet Commonly known as: LIPITOR TAKE 1 TABLET ONCE DAILY.    augmented betamethasone dipropionate 0.05 % cream Commonly known as: DIPROLENE-AF APPLY TO AFFECTED AREA TWICE A DAY.   betamethasone dipropionate 0.05 % cream Apply topically 2 (two) times daily.   Biotene Moisturizing Mouth Soln Use as directed 5 sprays in the mouth or throat 3 (three) times daily as needed.   Calcium Carbonate-Vitamin D3 600-400 MG-UNIT Tabs Take 1 tablet by mouth 2 (two) times daily.   fish oil-omega-3 fatty acids 1000 MG capsule Take 2 g by mouth daily.   Flax Oil-Fish Oil-Borage Oil Caps Take by mouth daily.   lisinopril 10 MG tablet Commonly known as: ZESTRIL TAKE 1 TABLET BY MOUTH DAILY.   metFORMIN 500 MG tablet Commonly known as: GLUCOPHAGE TAKE 1 TABLET BY MOUTH TWICE DAILY WITH A MEAL.   PRESERVISION AREDS 2+MULTI VIT PO Take 1 tablet by mouth 2 (two) times daily.   Vitamin B-12 5000 MCG Subl Place under the tongue daily.   vitamin C 100 MG tablet Take 100 mg by mouth daily.   Vitamin D-3 125 MCG (5000 UT) Tabs Take by mouth daily.   vitamin E 45 MG (100 UNITS) capsule Take by mouth daily.         Review of Systems:  Review of Systems  Constitutional: Positive for fatigue. Negative for appetite change and fever.       Felt chills last night, resolved today.   HENT: Positive for hearing loss. Negative for congestion and voice change.        Dry mouth  Eyes: Negative for visual disturbance.       Dry eyes  Respiratory: Negative for cough and shortness of breath.   Cardiovascular: Negative for chest pain, palpitations and leg swelling.  Gastrointestinal: Negative for abdominal pain, constipation, nausea and vomiting.  Genitourinary: Positive for frequency. Negative for difficulty urinating, dysuria, hematuria and urgency.       Average 6-7x/night.   Musculoskeletal: Positive for arthralgias, back pain and gait problem.       Right sciatica. Walks with walker and cane.   Skin: Negative for color change and pallor.  Neurological:  Negative for speech difficulty, weakness and headaches.  Psychiatric/Behavioral: Positive for sleep disturbance. Negative for behavioral problems. The patient is not nervous/anxious.        Memory lapses.     Health Maintenance  Topic Date Due  . INFLUENZA VACCINE  02/09/2020  . HEMOGLOBIN A1C  06/10/2020  . OPHTHALMOLOGY EXAM  08/20/2020  . FOOT EXAM  11/07/2020  . TETANUS/TDAP  12/15/2021  . DEXA SCAN  Completed  . COVID-19 Vaccine  Completed  . PNA vac Low Risk Adult  Completed    Physical Exam: Vitals:   04/09/20 1254  BP: 132/74  Pulse: 94  Temp: 97.8 F (36.6 C)  SpO2: 96%  Weight: 168 lb 6.4 oz (76.4 kg)  Height: 5' 3" (1.6 m)   Body mass index is 29.83 kg/m. Physical Exam Vitals and nursing note reviewed.  Constitutional:      Appearance: Normal appearance.  HENT:     Head: Normocephalic and atraumatic.     Mouth/Throat:     Mouth: Mucous membranes are moist.  Eyes:     Extraocular Movements: Extraocular movements intact.     Conjunctiva/sclera: Conjunctivae normal.     Pupils: Pupils are equal, round, and reactive to light.  Cardiovascular:     Rate and Rhythm: Normal rate and regular rhythm.     Heart sounds: No murmur heard.   Pulmonary:     Breath sounds: No wheezing, rhonchi or rales.  Abdominal:     General: Bowel sounds are normal.     Palpations: Abdomen is soft.     Tenderness: There is no abdominal tenderness.  Musculoskeletal:     Cervical back: Normal range of motion and neck supple.     Right lower leg: No edema.     Left lower leg: No edema.  Skin:    General: Skin is warm and dry.  Neurological:     General: No focal deficit present.     Mental Status: She is alert. Mental status is at baseline.     Gait: Gait abnormal.     Comments: Oriented to person, place.   Psychiatric:        Mood and Affect: Mood normal.        Behavior: Behavior normal.        Thought Content: Thought content normal.     Labs reviewed: Basic  Metabolic Panel: Recent Labs    05/14/19 1623 07/18/19 0720 12/10/19 0710  NA 138 141 139  K 4.4 3.9 4.6  CL 101 105 104  CO2 27 24 25  GLUCOSE 115* 128* 121*  BUN 25   17 19  CREATININE 0.75 0.72 0.81  CALCIUM 9.8 9.6 9.5  TSH 2.59 2.81  --    Liver Function Tests: Recent Labs    05/14/19 1623 07/18/19 0720 12/10/19 0710  AST _0 ALT _1 BILITOT 0.6 0.7 0.8  PROT 7.3 6.6 6.5   No results for input(s): LIPASE, AMYLASE in the last 8760 hours. No results for input(s): AMMONIA in the last 8760 hours. CBC: Recent Labs    07/18/19 0720 08/06/19 0720 12/10/19 0710  WBC 9.7 9.4 8.8  NEUTROABS 3,579 4,709 3,881  HGB 12.8 13.7 13.4  HCT 37.6 40.3 39.6  MCV 92.2 92.2 93.8  PLT 276 311 280   Lipid Panel: Recent Labs    05/06/19 1031 12/10/19 0710  CHOL 164 165  HDL 50 46*  LDLCALC 87 87  TRIG 173* 219*  CHOLHDL 3.3 3.6   Lab Results  Component Value Date   HGBA1C 5.9 (H) 12/10/2019    Procedures since last visit: No results found.  Assessment/Plan  Incontinent of urine Urinary frequency/incontinence, increased Oxybutynin 11m qd since 02/28/20, declined Urology in the past.  But the patient stopped taking it due to dry eyes, mouth, blurred vision.   Diabetes mellitus type 2, controlled ([place] Hgb a1c 6.0 11/06/19, decrease Metformin to daily, update Hgb a1c 3 months. Update CMP/CBC in one week.   Hypertension Blood pressure is controlled, continue Lisinopril.   Depression with anxiety Persisted, desires no mood stabilizer.   Back pain with right-sided sciatica Lower back pain, right sciatica, comes and goes for over a year. Declined Ortho or Xray. Currently participating aqua excise. May take prn Tylenol.      Labs/tests ordered:  CBC/diff, CMP/eGFR, Hgb a1c  Next appt:  06/18/2020

## 2020-04-09 NOTE — Assessment & Plan Note (Signed)
Lower back pain, right sciatica, comes and goes for over a year. Declined Ortho or Xray. Currently participating aqua excise. May take prn Tylenol.

## 2020-04-09 NOTE — Assessment & Plan Note (Addendum)
Hgb a1c 6.0 11/06/19, decrease Metformin to daily, update Hgb a1c 3 months. Update CMP/CBC in one week.

## 2020-04-09 NOTE — Assessment & Plan Note (Addendum)
Urinary frequency/incontinence, increased Oxybutynin 10mg  qd since 02/28/20, declined Urology in the past.  But the patient stopped taking it due to dry eyes, mouth, blurred vision.

## 2020-04-10 ENCOUNTER — Encounter: Payer: Self-pay | Admitting: Nurse Practitioner

## 2020-04-14 ENCOUNTER — Other Ambulatory Visit: Payer: Self-pay

## 2020-04-14 DIAGNOSIS — E1121 Type 2 diabetes mellitus with diabetic nephropathy: Secondary | ICD-10-CM | POA: Diagnosis not present

## 2020-04-15 LAB — COMPLETE METABOLIC PANEL WITH GFR
AG Ratio: 1.5 (calc) (ref 1.0–2.5)
ALT: 15 U/L (ref 6–29)
AST: 15 U/L (ref 10–35)
Albumin: 4.1 g/dL (ref 3.6–5.1)
Alkaline phosphatase (APISO): 63 U/L (ref 37–153)
BUN: 14 mg/dL (ref 7–25)
CO2: 27 mmol/L (ref 20–32)
Calcium: 9.6 mg/dL (ref 8.6–10.4)
Chloride: 103 mmol/L (ref 98–110)
Creat: 0.74 mg/dL (ref 0.60–0.88)
GFR, Est African American: 79 mL/min/{1.73_m2} (ref >=60)
GFR, Est Non African American: 68 mL/min/{1.73_m2} (ref >=60)
Globulin: 2.7 g/dL (calc) (ref 1.9–3.7)
Glucose, Bld: 207 mg/dL — ABNORMAL HIGH (ref 65–99)
Potassium: 4.3 mmol/L (ref 3.5–5.3)
Sodium: 139 mmol/L (ref 135–146)
Total Bilirubin: 0.7 mg/dL (ref 0.2–1.2)
Total Protein: 6.8 g/dL (ref 6.1–8.1)

## 2020-04-15 LAB — CBC WITH DIFFERENTIAL/PLATELET
Absolute Monocytes: 631 cells/uL (ref 200–950)
Basophils Absolute: 84 cells/uL (ref 0–200)
Basophils Relative: 1.1 %
Eosinophils Absolute: 114 cells/uL (ref 15–500)
Eosinophils Relative: 1.5 %
HCT: 40.6 % (ref 35.0–45.0)
Hemoglobin: 13.6 g/dL (ref 11.7–15.5)
Lymphs Abs: 3169 cells/uL (ref 850–3900)
MCH: 30.6 pg (ref 27.0–33.0)
MCHC: 33.5 g/dL (ref 32.0–36.0)
MCV: 91.4 fL (ref 80.0–100.0)
MPV: 11.8 fL (ref 7.5–12.5)
Monocytes Relative: 8.3 %
Neutro Abs: 3602 cells/uL (ref 1500–7800)
Neutrophils Relative %: 47.4 %
Platelets: 313 10*3/uL (ref 140–400)
RBC: 4.44 10*6/uL (ref 3.80–5.10)
RDW: 12.8 % (ref 11.0–15.0)
Total Lymphocyte: 41.7 %
WBC: 7.6 10*3/uL (ref 3.8–10.8)

## 2020-04-15 LAB — HEMOGLOBIN A1C
Hgb A1c MFr Bld: 6.5 % of total Hgb — ABNORMAL HIGH (ref <5.7)
Mean Plasma Glucose: 140 (calc)
eAG (mmol/L): 7.7 (calc)

## 2020-04-16 ENCOUNTER — Other Ambulatory Visit: Payer: Self-pay | Admitting: Internal Medicine

## 2020-04-22 DIAGNOSIS — Z23 Encounter for immunization: Secondary | ICD-10-CM | POA: Diagnosis not present

## 2020-05-18 ENCOUNTER — Other Ambulatory Visit: Payer: Self-pay | Admitting: Internal Medicine

## 2020-05-19 DIAGNOSIS — Z23 Encounter for immunization: Secondary | ICD-10-CM | POA: Diagnosis not present

## 2020-05-20 DIAGNOSIS — E119 Type 2 diabetes mellitus without complications: Secondary | ICD-10-CM | POA: Diagnosis not present

## 2020-05-20 DIAGNOSIS — Z961 Presence of intraocular lens: Secondary | ICD-10-CM | POA: Diagnosis not present

## 2020-05-20 DIAGNOSIS — H353132 Nonexudative age-related macular degeneration, bilateral, intermediate dry stage: Secondary | ICD-10-CM | POA: Diagnosis not present

## 2020-05-20 DIAGNOSIS — H52203 Unspecified astigmatism, bilateral: Secondary | ICD-10-CM | POA: Diagnosis not present

## 2020-06-18 ENCOUNTER — Other Ambulatory Visit: Payer: Self-pay

## 2020-06-18 DIAGNOSIS — I10 Essential (primary) hypertension: Secondary | ICD-10-CM | POA: Diagnosis not present

## 2020-06-18 DIAGNOSIS — E1169 Type 2 diabetes mellitus with other specified complication: Secondary | ICD-10-CM | POA: Diagnosis not present

## 2020-06-18 DIAGNOSIS — E1121 Type 2 diabetes mellitus with diabetic nephropathy: Secondary | ICD-10-CM

## 2020-06-19 ENCOUNTER — Other Ambulatory Visit: Payer: Self-pay | Admitting: Internal Medicine

## 2020-06-19 LAB — CBC WITH DIFFERENTIAL/PLATELET
Absolute Monocytes: 799 cells/uL (ref 200–950)
Basophils Absolute: 58 cells/uL (ref 0–200)
Basophils Relative: 0.8 %
Eosinophils Absolute: 158 cells/uL (ref 15–500)
Eosinophils Relative: 2.2 %
HCT: 38.6 % (ref 35.0–45.0)
Hemoglobin: 13.3 g/dL (ref 11.7–15.5)
Lymphs Abs: 3377 cells/uL (ref 850–3900)
MCH: 31.4 pg (ref 27.0–33.0)
MCHC: 34.5 g/dL (ref 32.0–36.0)
MCV: 91 fL (ref 80.0–100.0)
MPV: 11.7 fL (ref 7.5–12.5)
Monocytes Relative: 11.1 %
Neutro Abs: 2808 cells/uL (ref 1500–7800)
Neutrophils Relative %: 39 %
Platelets: 290 10*3/uL (ref 140–400)
RBC: 4.24 10*6/uL (ref 3.80–5.10)
RDW: 12.3 % (ref 11.0–15.0)
Total Lymphocyte: 46.9 %
WBC: 7.2 10*3/uL (ref 3.8–10.8)

## 2020-06-19 LAB — COMPLETE METABOLIC PANEL WITH GFR
AG Ratio: 1.6 (calc) (ref 1.0–2.5)
ALT: 11 U/L (ref 6–29)
AST: 17 U/L (ref 10–35)
Albumin: 4.1 g/dL (ref 3.6–5.1)
Alkaline phosphatase (APISO): 64 U/L (ref 37–153)
BUN: 19 mg/dL (ref 7–25)
CO2: 24 mmol/L (ref 20–32)
Calcium: 9.5 mg/dL (ref 8.6–10.4)
Chloride: 106 mmol/L (ref 98–110)
Creat: 0.8 mg/dL (ref 0.60–0.88)
GFR, Est African American: 72 mL/min/{1.73_m2} (ref >=60)
GFR, Est Non African American: 62 mL/min/{1.73_m2} (ref >=60)
Globulin: 2.6 g/dL (calc) (ref 1.9–3.7)
Glucose, Bld: 147 mg/dL — ABNORMAL HIGH (ref 65–99)
Potassium: 4.3 mmol/L (ref 3.5–5.3)
Sodium: 139 mmol/L (ref 135–146)
Total Bilirubin: 0.7 mg/dL (ref 0.2–1.2)
Total Protein: 6.7 g/dL (ref 6.1–8.1)

## 2020-06-19 LAB — HEMOGLOBIN A1C
Hgb A1c MFr Bld: 6.7 % of total Hgb — ABNORMAL HIGH (ref <5.7)
Mean Plasma Glucose: 146 mg/dL
eAG (mmol/L): 8.1 mmol/L

## 2020-06-19 NOTE — Telephone Encounter (Signed)
Patient is requesting refill on medication "Atorvastatin". Last refill was 05/18/2020 with 30 tablets to be taken once daily. I'm not sure if this medication is long term or trial? Medication pend and sent to Virgie Dad, MD . Please Advise.

## 2020-06-26 ENCOUNTER — Other Ambulatory Visit: Payer: Self-pay

## 2020-06-26 ENCOUNTER — Non-Acute Institutional Stay: Payer: Medicare Other | Admitting: Internal Medicine

## 2020-06-26 ENCOUNTER — Encounter: Payer: Self-pay | Admitting: Internal Medicine

## 2020-06-26 VITALS — BP 156/80 | HR 82 | Temp 97.7°F | Ht 63.0 in | Wt 169.6 lb

## 2020-06-26 DIAGNOSIS — E1121 Type 2 diabetes mellitus with diabetic nephropathy: Secondary | ICD-10-CM

## 2020-06-26 DIAGNOSIS — R682 Dry mouth, unspecified: Secondary | ICD-10-CM

## 2020-06-26 DIAGNOSIS — F418 Other specified anxiety disorders: Secondary | ICD-10-CM | POA: Diagnosis not present

## 2020-06-26 DIAGNOSIS — I10 Essential (primary) hypertension: Secondary | ICD-10-CM

## 2020-06-26 DIAGNOSIS — N3942 Incontinence without sensory awareness: Secondary | ICD-10-CM

## 2020-06-26 DIAGNOSIS — E1169 Type 2 diabetes mellitus with other specified complication: Secondary | ICD-10-CM | POA: Diagnosis not present

## 2020-06-26 DIAGNOSIS — E785 Hyperlipidemia, unspecified: Secondary | ICD-10-CM | POA: Diagnosis not present

## 2020-06-26 NOTE — Progress Notes (Addendum)
Location:  [place] of Service:  Clinic (12)  Provider:   Code Status: Goals of Care:  Advanced Directives 11/08/2018  Does Patient Have a Medical Advance Directive? Yes  Type of Advance Directive Living will  Does patient want to make changes to medical advance directive? No - Patient declined  Copy of [place] in Chart? -  Pre-existing out of facility DNR order (yellow form or pink MOST form) -     Chief Complaint  Patient presents with  . Medical Management of Chronic Issues    HPI: Patient is a 86 y.o. female seen today for medical management of chronic diseases.    Patient has h/o Hypertension, Diabetes Mellitus , Hyperlipidemia and Osteopenia, Left knee arthritis Urinary frequency Patient has a history of both stress and urge symptoms.  She could not afford Myrbetriq. Does not want to see Urologist Did not tolerate Ditropan. Continues to be issue but says she is managing fine  No Other issues Did have mechanical Fall 4 weeks ago with no injury No Dizziness Weight is stable Swims in the facility Pool. Mood is good. Walks with the walker  [place] . Has no family local. No children never been Married. . Independent in her ADLS and IADLS Her POA is her Niece.  Past Medical History:  Diagnosis Date  . Allergy   . Arthritis   . Diabetes mellitus   . Hernia, abdominal   . Hyperlipidemia   . Hypertension   . Osteopenia, senile 09/26/2012   T score - 1.6 Left femur    Past Surgical History:  Procedure Laterality Date  . APPENDECTOMY    . [place]  . EYE SURGERY    . GUM SURGERY    . KNEE SURGERY      No Known Allergies  Outpatient Encounter Medications as of 06/26/2020  Medication Sig  . Artificial Saliva (BIOTENE MOISTURIZING MOUTH) SOLN Use as directed 5 sprays in the mouth or throat 3 (three) times daily as needed.  . Ascorbic Acid (VITAMIN C) 100 MG tablet Take 100  mg by mouth daily.  Marland Kitchen atorvastatin (LIPITOR) 10 MG tablet TAKE 1 TABLET ONCE DAILY.  Marland Kitchen augmented betamethasone dipropionate (DIPROLENE-AF) 0.05 % cream APPLY TO AFFECTED AREA TWICE A DAY.  . betamethasone dipropionate 0.05 % cream Apply topically 2 (two) times daily.  . Calcium Carbonate-Vitamin D3 600-400 MG-UNIT TABS Take 1 tablet by mouth 2 (two) times daily.  . Cholecalciferol (VITAMIN D-3) 125 MCG (5000 UT) TABS Take by mouth daily.  . Cyanocobalamin (VITAMIN B-12) 5000 MCG SUBL Place under the tongue daily.  . fish oil-omega-3 fatty acids 1000 MG capsule Take 2 g by mouth daily.  . Flax Oil-Fish Oil-Borage Oil CAPS Take by mouth daily.  Marland Kitchen lisinopril (ZESTRIL) 10 MG tablet TAKE 1 TABLET BY MOUTH DAILY.  . metFORMIN (GLUCOPHAGE) 500 MG tablet Take 500 mg by mouth daily at 6 (six) AM.  . Multiple Vitamins-Minerals (PRESERVISION AREDS 2+MULTI VIT PO) Take 1 tablet by mouth 2 (two) times daily.  . vitamin E 100 UNIT capsule Take by mouth daily.  . [DISCONTINUED] metFORMIN (GLUCOPHAGE) 500 MG tablet TAKE 1 TABLET BY MOUTH TWICE DAILY WITH A MEAL. (Patient taking differently: TAKE 1 TABLET BY MOUTH DAILY WITH A MEAL.)   No facility-administered encounter medications on file as of 06/26/2020.    Review of Systems:  Review of Systems  Review of  Systems  Constitutional: Negative for activity change, appetite change, chills, diaphoresis, fatigue and fever.  HENT: Negative for mouth sores, postnasal drip, rhinorrhea, sinus pain and sore throat.   Respiratory: Negative for apnea, cough, chest tightness, shortness of breath and wheezing.   Cardiovascular: Negative for chest pain, palpitations and leg swelling.  Gastrointestinal: Negative for abdominal distention, abdominal pain, constipation, diarrhea, nausea and vomiting.  Genitourinary: Negative for dysuria   Musculoskeletal: Negative for arthralgias, joint swelling and myalgias.  Skin: Negative for rash.  Neurological: Negative for dizziness,  syncope, weakness, light-headedness and numbness.  Psychiatric/Behavioral: Negative for behavioral problems, confusion and sleep disturbance.     Health Maintenance  Topic Date Due  . OPHTHALMOLOGY EXAM  08/20/2020  . FOOT EXAM  11/07/2020  . HEMOGLOBIN A1C  12/17/2020  . TETANUS/TDAP  12/15/2021  . INFLUENZA VACCINE  Completed  . DEXA SCAN  Completed  . COVID-19 Vaccine  Completed  . PNA vac Low Risk Adult  Completed    Physical Exam: Vitals:   06/26/20 0928  BP: (!) 156/80  Pulse: 82  Temp: 97.7 F (36.5 C)  SpO2: 97%  Weight: 169 lb 9.6 oz (76.9 kg)  Height: 5\' 3"  (1.6 m)   Body mass index is 30.04 kg/m. Physical Exam  Constitutional: Oriented to person, place, and time. Well-developed and well-nourished.  HENT:  Head: Normocephalic.  Mouth/Throat: Oropharynx is clear and moist.  Eyes: Pupils are equal, round, and reactive to light.  Neck: Neck supple.  Cardiovascular: Normal rate and normal heart sounds.  No murmur heard. Pulmonary/Chest: Effort normal and breath sounds normal. No respiratory distress. No wheezes. She has no rales.  Abdominal: Soft. Bowel sounds are normal. No distension. There is no tenderness. There is no rebound. Has Umbilical Hernia Musculoskeletal: No edema. Left Knee arthritis Lymphadenopathy: none Neurological: Alert and oriented to person, place, and time.  Skin: Skin is warm and dry. Some Mild Patches of Dermatitis Psychiatric: Normal mood and affect. Behavior is normal. Thought content normal.    Labs reviewed: Basic Metabolic Panel: Recent Labs    07/18/19 0720 12/10/19 0710 04/14/20 0900 06/18/20 0711  NA 141 139 139 139  K 3.9 4.6 4.3 4.3  CL 105 104 103 106  CO2 24 25 27 24   GLUCOSE 128* 121* 207* 147*  BUN 17 19 14 19   CREATININE 0.72 0.81 0.74 0.80  CALCIUM 9.6 9.5 9.6 9.5  TSH 2.81  --   --   --    Liver Function Tests: Recent Labs    12/10/19 0710 04/14/20 0900 06/18/20 0711  AST 15 15 17   ALT 10 15 11    BILITOT 0.8 0.7 0.7  PROT 6.5 6.8 6.7   No results for input(s): LIPASE, AMYLASE in the last 8760 hours. No results for input(s): AMMONIA in the last 8760 hours. CBC: Recent Labs    12/10/19 0710 04/14/20 0900 06/18/20 0711  WBC 8.8 7.6 7.2  NEUTROABS 3,881 3,602 2,808  HGB 13.4 13.6 13.3  HCT 39.6 40.6 38.6  MCV 93.8 91.4 91.0  PLT 280 313 290   Lipid Panel: Recent Labs    12/10/19 0710  CHOL 165  HDL 46*  LDLCALC 87  TRIG 219*  CHOLHDL 3.6   Lab Results  Component Value Date   HGBA1C 6.7 (H) 06/18/2020    Procedures since last visit: No results found.  Assessment/Plan 1. Urinary incontinence without sensory awareness Did not take Myrbetriq due to cost Did not tolerate Ditropan Does not want to see Urologist  2. Controlled type 2 diabetes mellitus with diabetic nephropathy, without long-term current use of insulin ([place]) Will continue Metformin for now - Hemoglobin A1c; Future  3. Essential hypertension On Lisinopril - CBC with Differential/Platelet; Future - COMPLETE METABOLIC PANEL WITH GFR; Future  4. Depression with anxiety Did not like Lexapro or Zoloft Supportive care Doing better righ t now  5. Hyperlipidemia associated with type 2 diabetes mellitus ([place]) On Statin - Lipid panel; Future  6. Dry mouth Biotene  Will Try to get Shingrix vaccine from Pharmacy Stool softener for her Constipation  Labs/tests ordered:  * No order type specified * Next appt:  11/19/2020

## 2020-06-26 NOTE — Patient Instructions (Signed)
Take a stool softener at night and increase water intake. Kristopher Oppenheim to get shingrix vaccine.

## 2020-07-07 ENCOUNTER — Other Ambulatory Visit: Payer: Self-pay | Admitting: Nurse Practitioner

## 2020-07-20 ENCOUNTER — Other Ambulatory Visit: Payer: Self-pay | Admitting: Internal Medicine

## 2020-07-24 ENCOUNTER — Other Ambulatory Visit: Payer: Self-pay | Admitting: Internal Medicine

## 2020-08-06 ENCOUNTER — Other Ambulatory Visit: Payer: Self-pay

## 2020-08-06 ENCOUNTER — Encounter: Payer: Self-pay | Admitting: Nurse Practitioner

## 2020-08-06 ENCOUNTER — Telehealth: Payer: Self-pay

## 2020-08-06 ENCOUNTER — Ambulatory Visit (INDEPENDENT_AMBULATORY_CARE_PROVIDER_SITE_OTHER): Payer: Medicare Other | Admitting: Nurse Practitioner

## 2020-08-06 DIAGNOSIS — E2839 Other primary ovarian failure: Secondary | ICD-10-CM

## 2020-08-06 DIAGNOSIS — Z0000 Encounter for general adult medical examination without abnormal findings: Secondary | ICD-10-CM

## 2020-08-06 NOTE — Progress Notes (Signed)
Subjective:   Catherine Ellis is a 87 y.o. female who presents for Medicare Annual (Subsequent) preventive examination.  Review of Systems           Objective:    There were no vitals filed for this visit. There is no height or weight on file to calculate BMI.  Advanced Directives 08/06/2020 11/08/2018 06/09/2017 09/08/2015  Does Patient Have a Medical Advance Directive? Yes Yes Yes Yes  Type of Diplomatic Services operational officer Living will Healthcare Power of [place];Living will;Out of facility DNR (pink MOST or yellow form) Living will  Does patient want to make changes to medical advance directive? No - Patient declined No - Patient declined No - Patient declined No - Patient declined  Copy of Healthcare Power of Attorney in Chart? No - copy requested - Yes No - copy requested  Pre-existing out of facility DNR order (yellow form or pink MOST form) - - Yellow form placed in chart (order not valid for inpatient use);Pink MOST form placed in chart (order not valid for inpatient use) -    Current Medications (verified) Outpatient Encounter Medications as of 08/06/2020  Medication Sig  . Artificial Saliva (BIOTENE MOISTURIZING MOUTH) SOLN Use as directed 5 sprays in the mouth or throat 3 (three) times daily as needed.  . Ascorbic Acid (VITAMIN C) 100 MG tablet Take 100 mg by mouth daily.  Marland Kitchen atorvastatin (LIPITOR) 10 MG tablet TAKE 1 TABLET ONCE DAILY.  Marland Kitchen augmented betamethasone dipropionate (DIPROLENE-AF) 0.05 % cream APPLY TO AFFECTED AREA TWICE A DAY.  . betamethasone dipropionate 0.05 % cream Apply topically 2 (two) times daily.  . Calcium Carbonate-Vitamin D3 600-400 MG-UNIT TABS Take 1 tablet by mouth 2 (two) times daily.  . Cholecalciferol (VITAMIN D-3) 125 MCG (5000 UT) TABS Take by mouth daily.  . Cyanocobalamin (VITAMIN B-12) 5000 MCG SUBL Place under the tongue daily.  . fish oil-omega-3 fatty acids 1000 MG capsule Take 2 g by mouth daily.  . Flax Oil-Fish  Oil-Borage Oil CAPS Take by mouth daily.  Marland Kitchen lisinopril (ZESTRIL) 10 MG tablet TAKE 1 TABLET BY MOUTH DAILY.  . metFORMIN (GLUCOPHAGE) 500 MG tablet Take 500 mg by mouth daily at 6 (six) AM.  . Multiple Vitamins-Minerals (PRESERVISION AREDS 2+MULTI VIT PO) Take 1 tablet by mouth 2 (two) times daily.  . vitamin E 100 UNIT capsule Take by mouth daily.  . [DISCONTINUED] ACCU-CHEK GUIDE test strip USE TO TEST AS DIRECTED   No facility-administered encounter medications on file as of 08/06/2020.    Allergies (verified) Patient has no known allergies.   History: Past Medical History:  Diagnosis Date  . Allergy   . Arthritis   . Diabetes mellitus   . Hernia, abdominal   . Hyperlipidemia   . Hypertension   . Osteopenia, senile 09/26/2012   T score - 1.6 Left femur   Past Surgical History:  Procedure Laterality Date  . APPENDECTOMY    . BACK SURGERY     1965  . EYE SURGERY    . GUM SURGERY    . KNEE SURGERY     Family History  Problem Relation Age of Onset  . Stroke Mother   . Heart disease Father    Social History   Socioeconomic History  . Marital status: Single    Spouse name: Not on file  . Number of children: Not on file  . Years of education: Not on file  . Highest education level: Not on file  Occupational History  . Not on file  Tobacco Use  . Smoking status: Never Smoker  . Smokeless tobacco: Never Used  Vaping Use  . Vaping Use: Never used  Substance and Sexual Activity  . Alcohol use: No  . Drug use: No  . Sexual activity: Not on file  Other Topics Concern  . Not on file  Social History Narrative   Tobacco use, amount per day now: None      Past tobacco use, amount per day: None      How many years did you use tobacco: None      Alcohol use (drinks per week): None      Diet: Regular      Do you drink/eat things with caffeine? Sweet tea      Marital status: Single            What year were you married?      Do you live in a house, apartment,  assisted living, condo, trailer? Apartment      Is it one or more stories? 1      How many persons live in your home? 1      Do you have any pets in your home? 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 Yes             How often? Water exercise 5 days a week.        Do you have a living will? Yes      Do you have a DNR form? No            If not, do you want to discuss one? Yes      Do you have signed POA/HPOA forms?  Yes            Social Determinants of Health   Financial Resource Strain: Not on file  Food Insecurity: Not on file  Transportation Needs: Not on file  Physical Activity: Not on file  Stress: Not on file  Social Connections: Not on file    Tobacco Counseling Counseling given: Not Answered   Clinical Intake:  Pre-visit preparation completed: Yes  Pain : No/denies pain     BMI - recorded: 30 Nutritional Risks: None Diabetes: Yes  How often do you need to have someone help you when you read instructions, pamphlets, or other written materials from your doctor or pharmacy?: 3 - Sometimes  Diabetic? yes         Activities of Daily Living In your present state of health, do you have any difficulty performing the following activities: 08/06/2020  Hearing? Y  Vision? N  Difficulty concentrating or making decisions? N  Walking or climbing stairs? Y  Comment uses walker  Dressing or bathing? N  Doing errands, shopping? Y  Comment does not Physiological scientist and eating ? N  Using the Toilet? N  In the past six months, have you accidently leaked urine? Y  Do you have problems with loss of bowel control? N  Managing your Medications? N  Managing your Finances? Y  Comment has help  Housekeeping or managing your Housekeeping? N  Some recent data might be hidden    Patient Care Team: Virgie Dad, MD as PCP - General (Internal Medicine) Mast, Man X, NP as Nurse Practitioner (Internal Medicine)  Indicate any  recent Medical Services you may have received from other than [facility] providers  in the past year (date may be approximate).     Assessment:   This is a routine wellness examination for Meyer.  Hearing/Vision screen  Hearing Screening   125Hz  250Hz  500Hz  1000Hz  2000Hz  3000Hz  4000Hz  6000Hz  8000Hz   Right ear:           Left ear:           Comments: Patient has some trouble hearing. Patient does not wear hearing aids  Vision Screening Comments: Patient had cataract surgery. Patient wears glasses. Patient goes to [facility]. Patient had eye exam about a month ago.  Dietary issues and exercise activities discussed: Current Exercise Habits: Home exercise routine, Type of exercise: calisthenics;walking, Time (Minutes): 30, Frequency (Times/Week): 5, Weekly Exercise (Minutes/Week): 150  Goals    . DIET - INCREASE WATER INTAKE     Patient will increase water intake to 8 glasses a day      Depression Screen PHQ 2/9 Scores 08/06/2020 06/09/2017 09/12/2016 08/03/2015 04/27/2015 03/19/2015 02/16/2015  PHQ - 2 Score 0 0 0 0 0 0 0    Fall Risk Fall Risk  08/06/2020 06/26/2020 04/09/2020 02/28/2020 11/08/2019  Falls in the past year? 1 1 0 0 0  Comment - - - - -  Number falls in past yr: 1 0 0 0 0  Injury with Fall? 0 0 - - -  Risk for fall due to : - - - - -  Risk for fall due to: Comment - - - - -    FALL RISK PREVENTION PERTAINING TO THE HOME:  Any stairs in or around the home? Yes  If so, are there any without handrails? No  Home free of loose throw rugs in walkways, pet beds, electrical cords, etc? Yes  Adequate lighting in your home to reduce risk of falls? Yes   ASSISTIVE DEVICES UTILIZED TO PREVENT FALLS:  Life alert? Yes  Use of a cane, walker or w/c? Yes  Grab bars in the bathroom? Yes  Shower chair or bench in shower? Yes  Elevated toilet seat or a handicapped toilet? Yes   TIMED UP AND GO:  Was the test performed? No .    Cognitive Function: MMSE - Mini Mental  State Exam 09/13/2019 03/06/2018 04/25/2017 04/25/2017  Not completed: - - (No Data) -  Orientation to time 5 5 5 5   Orientation to Place 5 5 5 5   Registration 3 3 3 3   Attention/ Calculation 1 4 4 4   Recall 1 1 1 1   Language- name 2 objects 2 2 2 2   Language- repeat 1 1 1 1   Language- follow 3 step command 3 3 3 3   Language- read & follow direction 1 1 1 1   Write a sentence 1 1 1 1   Copy design 1 1 1 1   Total score 24 27 27 27      6CIT Screen 08/06/2020  What Year? 0 points  What month? 0 points  What time? 0 points  Count back from 20 0 points  Months in reverse 0 points  Repeat phrase 0 points  Total Score 0    Immunizations Immunization History  Administered Date(s) Administered  . Influenza Split 04/13/2012  . Influenza Whole 04/12/2018  . Influenza, High Dose Seasonal PF 04/24/2019, 04/22/2020  . Influenza,inj,Quad PF,6+ Mos 06/14/2013, 04/14/2014, 04/27/2015  . Influenza-Unspecified 04/10/2016  . Moderna Sars-Covid-2 Vaccination 07/15/2019, 08/12/2019, 05/19/2020  . PPD Test 12/16/2011, 12/16/2011  . Pneumococcal Conjugate-13 07/14/2014  . Pneumococcal Polysaccharide-23 01/04/2010  . Tdap 12/16/2011  TDAP status: Up to date  Flu Vaccine status: Up to date  Pneumococcal vaccine status: Up to date  Covid-19 vaccine status: Completed vaccines  Qualifies for Shingles Vaccine? Yes   Zostavax completed No   Shingrix Completed?: No.    Education has been provided regarding the importance of this vaccine. Patient has been advised to call insurance company to determine out of pocket expense if they have not yet received this vaccine. Advised may also receive vaccine at local pharmacy or Health Dept. Verbalized acceptance and understanding.  Screening Tests Health Maintenance  Topic Date Due  . OPHTHALMOLOGY EXAM  08/20/2020  . FOOT EXAM  11/07/2020  . HEMOGLOBIN A1C  12/17/2020  . TETANUS/TDAP  12/15/2021  . INFLUENZA VACCINE  Completed  . DEXA SCAN  Completed   . COVID-19 Vaccine  Completed  . PNA vac Low Risk Adult  Completed    Health Maintenance  There are no preventive care reminders to display for this patient.  Colorectal cancer screening: No longer required.   Mammogram status: No longer required due to age.  Bone Density status: Ordered today. Pt provided with contact info and advised to call to schedule appt.  Lung Cancer Screening: (Low Dose CT Chest recommended if Age 42-80 years, 30 pack-year currently smoking OR have quit w/in 15years.) does not qualify.   Lung Cancer Screening Referral: na  Additional Screening:  Hepatitis C Screening: does not qualify; Completed na  Vision Screening: Recommended annual ophthalmology exams for early detection of glaucoma and other disorders of the eye. Is the patient up to date with their annual eye exam?  Yes  Who is the provider or what is the name of the office in which the patient attends annual eye exams? [facility] opthalmology  If pt is not established with a provider, would they like to be referred to a provider to establish care? No .   Dental Screening: Recommended annual dental exams for proper oral hygiene  Community Resource Referral / Chronic Care Management: CRR required this visit?  No   CCM required this visit?  No      Plan:     I have personally reviewed and noted the following in the patient's chart:   . Medical and social history . Use of alcohol, tobacco or illicit drugs  . Current medications and supplements . Functional ability and status . Nutritional status . Physical activity . Advanced directives . List of other physicians . Hospitalizations, surgeries, and ER visits in previous 12 months . Vitals . Screenings to include cognitive, depression, and falls . Referrals and appointments  In addition, I have reviewed and discussed with patient certain preventive protocols, quality metrics, and best practice recommendations. A written personalized  care plan for preventive services as well as general preventive health recommendations were provided to patient.     Lauree Chandler, NP   08/06/2020    Virtual Visit via Telephone Note  I connected with@ on 08/06/20 at  2:15 PM EST by telephone and verified that I am speaking with the correct person using two identifiers.  Location: Patient: home Provider: [place]   I discussed the limitations, risks, security and privacy concerns of performing an evaluation and management service by telephone and the availability of in person appointments. I also discussed with the patient that there may be a patient responsible charge related to this service. The patient expressed understanding and agreed to proceed.   I discussed the assessment and treatment plan with the patient.  The patient was provided an opportunity to ask questions and all were answered. The patient agreed with the plan and demonstrated an understanding of the instructions.   The patient was advised to call back or seek an in-person evaluation if the symptoms worsen or if the condition fails to improve as anticipated.  I provided 25 minutes of non-face-to-face time during this encounter.  Janene Harvey. Biagio [employer] Avs printed and mailed

## 2020-08-06 NOTE — Telephone Encounter (Signed)
[name], wellman are scheduled for a virtual visit with your provider today.    Just as we do with appointments in the office, we must obtain your consent to participate.  Your consent will be active for this visit and any virtual visit you may have with one of our providers in the next 365 days.    If you have a MyChart account, I can also send a copy of this consent to you electronically.  All virtual visits are billed to your insurance company just like a traditional visit in the office.  As this is a virtual visit, video technology does not allow for your provider to perform a traditional examination.  This may limit your provider's ability to fully assess your condition.  If your provider identifies any concerns that need to be evaluated in person or the need to arrange testing such as labs, EKG, etc, we will make arrangements to do so.    Although advances in technology are sophisticated, we cannot ensure that it will always work on either your end or our end.  If the connection with a video visit is poor, we may have to switch to a telephone visit.  With either a video or telephone visit, we are not always able to ensure that we have a secure connection.   I need to obtain your verbal consent now.   Are you willing to proceed with your visit today?   Catherine Ellis has provided verbal consent on 08/06/2020 for a virtual visit (video or telephone).   Carroll Kinds, CMA 08/06/2020  2:52 PM

## 2020-08-06 NOTE — Progress Notes (Signed)
This service is provided via telemedicine  No vital signs collected/recorded due to the encounter was a telemedicine visit.   Location of patient (ex: home, work): Home  Patient consents to a telephone visit:  Yes, see encounter dated 08/06/2020  Location of the provider (ex: office, home):  [place]  Name of any referring provider: Veleta Miners, MD  Names of all persons participating in the telemedicine service and their role in the encounter:  Sherrie Mustache, Nurse Practitioner, Carroll Kinds, CMA, and patient.   Time spent on call: 15 minutes with medical assistant

## 2020-08-06 NOTE — Patient Instructions (Signed)
[name] , Thank you for taking time to come for your Medicare Wellness Visit. I appreciate your ongoing commitment to your health goals. Please review the following plan we discussed and let me know if I can assist you in the future.   Screening recommendations/referrals: Colonoscopy aged out Mammogram aged out Bone Density ordered today call [phone] Recommended yearly ophthalmology/optometry visit for glaucoma screening and checkup Recommended yearly dental visit for hygiene and checkup  Vaccinations: Influenza vaccine up to date Pneumococcal vaccine  Up to date Tdap vaccine - up to date Shingles vaccine RECOMMENDED- to get at local pharmacy    Advanced directives: recommended to bring copy of advance directive to next appt with Dr Lyndel Safe so we can place on file.   Conditions/risks identified: advance age, falls.   Next appointment: yearly   Preventive Care 60 Years and Older, Female Preventive care refers to lifestyle choices and visits with your health care provider that can promote health and wellness. What does preventive care include?  A yearly physical exam. This is also called an annual well check.  Dental exams once or twice a year.  Routine eye exams. Ask your health care provider how often you should have your eyes checked.  Personal lifestyle choices, including:  Daily care of your teeth and gums.  Regular physical activity.  Eating a healthy diet.  Avoiding tobacco and drug use.  Limiting alcohol use.  Practicing safe sex.  Taking low-dose aspirin every day.  Taking vitamin and mineral supplements as recommended by your health care provider. What happens during an annual well check? The services and screenings done by your health care provider during your annual well check will depend on your age, overall health, lifestyle risk factors, and family history of disease. Counseling  Your health care provider may ask you questions about  your:  Alcohol use.  Tobacco use.  Drug use.  Emotional well-being.  Home and relationship well-being.  Sexual activity.  Eating habits.  History of falls.  Memory and ability to understand (cognition).  Work and work Statistician.  Reproductive health. Screening  You may have the following tests or measurements:  Height, weight, and BMI.  Blood pressure.  Lipid and cholesterol levels. These may be checked every 5 years, or more frequently if you are over 44 years old.  Skin check.  Lung cancer screening. You may have this screening every year starting at age 73 if you have a 30-pack-year history of smoking and currently smoke or have quit within the past 15 years.  Fecal occult blood test (FOBT) of the stool. You may have this test every year starting at age 76.  Flexible sigmoidoscopy or colonoscopy. You may have a sigmoidoscopy every 5 years or a colonoscopy every 10 years starting at age 106.  Hepatitis C blood test.  Hepatitis B blood test.  Sexually transmitted disease (STD) testing.  Diabetes screening. This is done by checking your blood sugar (glucose) after you have not eaten for a while (fasting). You may have this done every 1-3 years.  Bone density scan. This is done to screen for osteoporosis. You may have this done starting at age 26.  Mammogram. This may be done every 1-2 years. Talk to your health care provider about how often you should have regular mammograms. Talk with your health care provider about your test results, treatment options, and if necessary, the need for more tests. Vaccines  Your health care provider may recommend certain vaccines, such as:  Influenza vaccine.  This is recommended every year.  Tetanus, diphtheria, and acellular pertussis (Tdap, Td) vaccine. You may need a Td booster every 10 years.  Zoster vaccine. You may need this after age 68.  Pneumococcal 13-valent conjugate (PCV13) vaccine. One dose is recommended  after age 76.  Pneumococcal polysaccharide (PPSV23) vaccine. One dose is recommended after age 20. Talk to your health care provider about which screenings and vaccines you need and how often you need them. This information is not intended to replace advice given to you by your health care provider. Make sure you discuss any questions you have with your health care provider. Document Released: 07/24/2015 Document Revised: 03/16/2016 Document Reviewed: 04/28/2015 Elsevier Interactive Patient Education  2017 [employer] Prevention in the Home Falls can cause injuries. They can happen to people of all ages. There are many things you can do to make your home safe and to help prevent falls. What can I do on the outside of my home?  Regularly fix the edges of walkways and driveways and fix any cracks.  Remove anything that might make you trip as you walk through a door, such as a raised step or threshold.  Trim any bushes or trees on the path to your home.  Use bright outdoor lighting.  Clear any walking paths of anything that might make someone trip, such as rocks or tools.  Regularly check to see if handrails are loose or broken. Make sure that both sides of any steps have handrails.  Any raised decks and porches should have guardrails on the edges.  Have any leaves, snow, or ice cleared regularly.  Use sand or salt on walking paths during winter.  Clean up any spills in your garage right away. This includes oil or grease spills. What can I do in the bathroom?  Use night lights.  Install grab bars by the toilet and in the tub and shower. Do not use towel bars as grab bars.  Use non-skid mats or decals in the tub or shower.  If you need to sit down in the shower, use a plastic, non-slip stool.  Keep the floor dry. Clean up any water that spills on the floor as soon as it happens.  Remove soap buildup in the tub or shower regularly.  Attach bath mats securely with  double-sided non-slip rug tape.  Do not have throw rugs and other things on the floor that can make you trip. What can I do in the bedroom?  Use night lights.  Make sure that you have a light by your bed that is easy to reach.  Do not use any sheets or blankets that are too big for your bed. They should not hang down onto the floor.  Have a firm chair that has side arms. You can use this for support while you get dressed.  Do not have throw rugs and other things on the floor that can make you trip. What can I do in the kitchen?  Clean up any spills right away.  Avoid walking on wet floors.  Keep items that you use a lot in easy-to-reach places.  If you need to reach something above you, use a strong step stool that has a grab bar.  Keep electrical cords out of the way.  Do not use floor polish or wax that makes floors slippery. If you must use wax, use non-skid floor wax.  Do not have throw rugs and other things on the floor that can make  you trip. What can I do with my stairs?  Do not leave any items on the stairs.  Make sure that there are handrails on both sides of the stairs and use them. Fix handrails that are broken or loose. Make sure that handrails are as long as the stairways.  Check any carpeting to make sure that it is firmly attached to the stairs. Fix any carpet that is loose or worn.  Avoid having throw rugs at the top or bottom of the stairs. If you do have throw rugs, attach them to the floor with carpet tape.  Make sure that you have a light switch at the top of the stairs and the bottom of the stairs. If you do not have them, ask someone to add them for you. What else can I do to help prevent falls?  Wear shoes that:  Do not have high heels.  Have rubber bottoms.  Are comfortable and fit you well.  Are closed at the toe. Do not wear sandals.  If you use a stepladder:  Make sure that it is fully opened. Do not climb a closed stepladder.  Make  sure that both sides of the stepladder are locked into place.  Ask someone to hold it for you, if possible.  Clearly mark and make sure that you can see:  Any grab bars or handrails.  First and last steps.  Where the edge of each step is.  Use tools that help you move around (mobility aids) if they are needed. These include:  Canes.  Walkers.  Scooters.  Crutches.  Turn on the lights when you go into a dark area. Replace any light bulbs as soon as they burn out.  Set up your furniture so you have a clear path. Avoid moving your furniture around.  If any of your floors are uneven, fix them.  If there are any pets around you, be aware of where they are.  Review your medicines with your doctor. Some medicines can make you feel dizzy. This can increase your chance of falling. Ask your doctor what other things that you can do to help prevent falls. This information is not intended to replace advice given to you by your health care provider. Make sure you discuss any questions you have with your health care provider. Document Released: 04/23/2009 Document Revised: 12/03/2015 Document Reviewed: 08/01/2014 Elsevier Interactive Patient Education  2017 [employer].

## 2020-08-09 ENCOUNTER — Other Ambulatory Visit: Payer: Self-pay | Admitting: Internal Medicine

## 2020-08-19 ENCOUNTER — Other Ambulatory Visit: Payer: Self-pay | Admitting: Internal Medicine

## 2020-09-03 ENCOUNTER — Other Ambulatory Visit: Payer: Self-pay | Admitting: Internal Medicine

## 2020-09-21 ENCOUNTER — Other Ambulatory Visit: Payer: Self-pay | Admitting: Internal Medicine

## 2020-10-20 DIAGNOSIS — L259 Unspecified contact dermatitis, unspecified cause: Secondary | ICD-10-CM | POA: Diagnosis not present

## 2020-10-20 DIAGNOSIS — L853 Xerosis cutis: Secondary | ICD-10-CM | POA: Diagnosis not present

## 2020-10-20 DIAGNOSIS — L814 Other melanin hyperpigmentation: Secondary | ICD-10-CM | POA: Diagnosis not present

## 2020-10-20 DIAGNOSIS — D1800 Hemangioma unspecified site: Secondary | ICD-10-CM | POA: Diagnosis not present

## 2020-10-20 DIAGNOSIS — L821 Other seborrheic keratosis: Secondary | ICD-10-CM | POA: Diagnosis not present

## 2020-11-10 ENCOUNTER — Other Ambulatory Visit: Payer: Self-pay | Admitting: Internal Medicine

## 2020-11-17 ENCOUNTER — Telehealth: Payer: Self-pay | Admitting: Nurse Practitioner

## 2020-11-17 NOTE — Telephone Encounter (Signed)
Catherine Ellis called and said she is having symptoms of covid, didn't specify what symptoms she was having. She wanted to see a provider at [facility] but I informed her there was no clinic and I offered for her to come in office and see a provider but she said she couldn't she did not have transportation. She said the [facility] facility told her they wouldn't test her unless she had a visit with the provider. She was angry and hung up on me after I told her I couldn't do anything but offer her to come into the office today. I dont know if theres anything you could do or not.

## 2020-11-18 DIAGNOSIS — E785 Hyperlipidemia, unspecified: Secondary | ICD-10-CM | POA: Diagnosis not present

## 2020-11-18 DIAGNOSIS — I10 Essential (primary) hypertension: Secondary | ICD-10-CM | POA: Diagnosis not present

## 2020-11-18 DIAGNOSIS — E1121 Type 2 diabetes mellitus with diabetic nephropathy: Secondary | ICD-10-CM | POA: Diagnosis not present

## 2020-11-18 DIAGNOSIS — E1169 Type 2 diabetes mellitus with other specified complication: Secondary | ICD-10-CM | POA: Diagnosis not present

## 2020-11-19 ENCOUNTER — Emergency Department (HOSPITAL_COMMUNITY): Payer: Medicare Other

## 2020-11-19 ENCOUNTER — Other Ambulatory Visit: Payer: Self-pay

## 2020-11-19 ENCOUNTER — Non-Acute Institutional Stay: Payer: Medicare Other | Admitting: Nurse Practitioner

## 2020-11-19 ENCOUNTER — Emergency Department (HOSPITAL_COMMUNITY)
Admission: EM | Admit: 2020-11-19 | Discharge: 2020-11-19 | Disposition: A | Discharge status: Home / Self Care | Payer: Medicare Other | Attending: Emergency Medicine | Admitting: Emergency Medicine

## 2020-11-19 ENCOUNTER — Encounter: Payer: Self-pay | Admitting: Nurse Practitioner

## 2020-11-19 DIAGNOSIS — I10 Essential (primary) hypertension: Secondary | ICD-10-CM | POA: Insufficient documentation

## 2020-11-19 DIAGNOSIS — Z7984 Long term (current) use of oral hypoglycemic drugs: Secondary | ICD-10-CM | POA: Diagnosis not present

## 2020-11-19 DIAGNOSIS — N3942 Incontinence without sensory awareness: Secondary | ICD-10-CM

## 2020-11-19 DIAGNOSIS — F418 Other specified anxiety disorders: Secondary | ICD-10-CM | POA: Diagnosis not present

## 2020-11-19 DIAGNOSIS — E119 Type 2 diabetes mellitus without complications: Secondary | ICD-10-CM | POA: Insufficient documentation

## 2020-11-19 DIAGNOSIS — Z8616 Personal history of COVID-19: Secondary | ICD-10-CM | POA: Insufficient documentation

## 2020-11-19 DIAGNOSIS — M5431 Sciatica, right side: Secondary | ICD-10-CM

## 2020-11-19 DIAGNOSIS — Z79899 Other long term (current) drug therapy: Secondary | ICD-10-CM | POA: Insufficient documentation

## 2020-11-19 DIAGNOSIS — E1121 Type 2 diabetes mellitus with diabetic nephropathy: Secondary | ICD-10-CM | POA: Diagnosis not present

## 2020-11-19 DIAGNOSIS — U071 COVID-19: Secondary | ICD-10-CM | POA: Diagnosis not present

## 2020-11-19 DIAGNOSIS — E1169 Type 2 diabetes mellitus with other specified complication: Secondary | ICD-10-CM

## 2020-11-19 DIAGNOSIS — E785 Hyperlipidemia, unspecified: Secondary | ICD-10-CM

## 2020-11-19 DIAGNOSIS — R531 Weakness: Secondary | ICD-10-CM | POA: Diagnosis not present

## 2020-11-19 DIAGNOSIS — R5383 Other fatigue: Secondary | ICD-10-CM | POA: Diagnosis not present

## 2020-11-19 LAB — CBC WITH DIFFERENTIAL/PLATELET
Abs Immature Granulocytes: 0.02 10*3/uL (ref 0.00–0.07)
Basophils Absolute: 0.1 10*3/uL (ref 0.0–0.1)
Basophils Relative: 1 %
Eosinophils Absolute: 0.1 10*3/uL (ref 0.0–0.5)
Eosinophils Relative: 1 %
HCT: 41.6 % (ref 36.0–46.0)
Hemoglobin: 13.9 g/dL (ref 12.0–15.0)
Immature Granulocytes: 0 %
Lymphocytes Relative: 35 %
Lymphs Abs: 3.1 10*3/uL (ref 0.7–4.0)
MCH: 31 pg (ref 26.0–34.0)
MCHC: 33.4 g/dL (ref 30.0–36.0)
MCV: 92.9 fL (ref 80.0–100.0)
Monocytes Absolute: 1.5 10*3/uL — ABNORMAL HIGH (ref 0.1–1.0)
Monocytes Relative: 18 %
Neutro Abs: 4 10*3/uL (ref 1.7–7.7)
Neutrophils Relative %: 45 %
Platelets: 252 10*3/uL (ref 150–400)
RBC: 4.48 MIL/uL (ref 3.87–5.11)
RDW: 13.5 % (ref 11.5–15.5)
WBC: 8.6 10*3/uL (ref 4.0–10.5)
nRBC: 0 % (ref 0.0–0.2)

## 2020-11-19 LAB — COMPREHENSIVE METABOLIC PANEL
ALT: 13 U/L (ref 0–44)
AST: 20 U/L (ref 15–41)
Albumin: 3.9 g/dL (ref 3.5–5.0)
Alkaline Phosphatase: 44 U/L (ref 38–126)
Anion gap: 10 (ref 5–15)
BUN: 23 mg/dL (ref 8–23)
CO2: 24 mmol/L (ref 22–32)
Calcium: 9.2 mg/dL (ref 8.9–10.3)
Chloride: 102 mmol/L (ref 98–111)
Creatinine, Ser: 0.96 mg/dL (ref 0.44–1.00)
GFR, Estimated: 54 mL/min — ABNORMAL LOW (ref >60)
Glucose, Bld: 125 mg/dL — ABNORMAL HIGH (ref 70–99)
Potassium: 4.1 mmol/L (ref 3.5–5.1)
Sodium: 136 mmol/L (ref 135–145)
Total Bilirubin: 0.5 mg/dL (ref 0.3–1.2)
Total Protein: 7.2 g/dL (ref 6.5–8.1)

## 2020-11-19 MED ORDER — DIPHENHYDRAMINE HCL 50 MG/ML IJ SOLN: 50.0000 mg | Freq: Once | INTRAMUSCULAR | Status: AC | PRN

## 2020-11-19 MED ORDER — SODIUM CHLORIDE 0.9 % IV SOLN
INTRAVENOUS | Status: AC | PRN
Start: 2020-11-19 — End: ?

## 2020-11-19 MED ORDER — BEBTELOVIMAB 175 MG/2 ML IV (EUA): 175.0000 mg | Freq: Once | INTRAMUSCULAR | Status: DC

## 2020-11-19 MED ORDER — METHYLPREDNISOLONE SODIUM SUCC 125 MG IJ SOLR: 125.0000 mg | Freq: Once | INTRAMUSCULAR | Status: AC | PRN

## 2020-11-19 MED ORDER — EPINEPHRINE 0.3 MG/0.3ML IJ SOAJ: 0.3000 mg | Freq: Once | INTRAMUSCULAR | Status: AC | PRN

## 2020-11-19 MED ORDER — SODIUM CHLORIDE 0.9 % IV BOLUS
500.0000 mL | Freq: Once | INTRAVENOUS | Status: AC
  Administered 2020-11-19: 500 mL via INTRAVENOUS

## 2020-11-19 MED ORDER — ALBUTEROL SULFATE HFA 108 (90 BASE) MCG/ACT IN AERS: 2.0000 | INHALATION_SPRAY | Freq: Once | RESPIRATORY_TRACT | Status: AC | PRN

## 2020-11-19 MED ORDER — FAMOTIDINE IN NACL 20-0.9 MG/50ML-% IV SOLN: 20.0000 mg | Freq: Once | INTRAVENOUS | Status: AC | PRN

## 2020-11-19 NOTE — Assessment & Plan Note (Signed)
better,takes Oxybutynin, increased to 10mg qd 02/28/20, but the patient stopped taking it due to dry eyes, mouth, blurred vision.   

## 2020-11-19 NOTE — Assessment & Plan Note (Signed)
nasal congestion, running nose, cough, headache, sore throat, fatigue, decreased appetite for several days, rapid COVID test negative x2, but positive today in clinic. The patient is afebrile, no O2 desaturation, denied chest pain. Wbc 9.6, neutrophils 53.2% today.  ED to eval and tx.

## 2020-11-19 NOTE — Progress Notes (Signed)
Location:    clinic [place] Place of Service:  Clinic (12) Provider: Marlana Latus NP  Code Status: DNR Goals of Care: [place] 11/19/2020  Does Patient Have a Medical Advance Directive? No  Type of Advance Directive -  Does patient want to make changes to medical advance directive? -  Copy of [place] in Chart? -  Would patient like information on creating a medical advance directive? No - Patient declined  Pre-existing out of facility DNR order (yellow form or pink MOST form) -     Chief Complaint  Patient presents with  . Acute Visit    Patient complains of cough, headache, sore throat, fatigue, and chest congestion for the last four days. Patient states she has started taking Robitussin and Mucinex DM with some relief. [facility].    HPI: Patient is a 87 y.o. female seen today for nasal congestion, running nose, cough, headache, sore throat, fatigue, decreased appetite for several days, rapid COVID test negative x2, but positive in clinic. The patient is afebrile, no O2 desaturation, chest pain. Wbc 9.6, neutrophils 53.2% today.     HTN, blood pressure is controlled on Lisinopril 10mg  qd, Atorvastatin 10mg  qd.              Her mood has no change, not taking mood stabilizer.              T2DM, takes Metformin, last Hgb a1c 6.0, concerns of renal function.              Urinary frequency, better,  takes Oxybutynin, increased to 10mg  qd 02/28/20, but the patient stopped taking it due to dry eyes, mouth, blurred vision.              Lower back pain, right sciatica, comes and goes for over a year.    Past Medical History:  Diagnosis Date  . Allergy   . Arthritis   . Diabetes mellitus   . Hernia, abdominal   . Hyperlipidemia   . Hypertension   . Osteopenia, senile 09/26/2012   T score - 1.6 Left femur    Past Surgical History:  Procedure Laterality Date  . APPENDECTOMY    . [place]  . EYE SURGERY    . GUM SURGERY    . KNEE SURGERY      No Known Allergies  Allergies as of 11/19/2020   No Known Allergies     Medication List       Accurate as of Nov 19, 2020  5:05 PM. If you have any questions, ask your nurse or doctor.        atorvastatin 10 MG tablet Commonly known as: LIPITOR TAKE 1 TABLET ONCE DAILY.   augmented betamethasone dipropionate 0.05 % cream Commonly known as: DIPROLENE-AF APPLY TO AFFECTED AREA TWICE A DAY.   betamethasone dipropionate 0.05 % cream Apply topically 2 (two) times daily.   Biotene Moisturizing Mouth Soln Use as directed 5 sprays in the mouth or throat 3 (three) times daily as needed.   Calcium Carbonate-Vitamin D3 600-400 MG-UNIT Tabs Take 1 tablet by mouth 2 (two) times daily.   fish oil-omega-3 fatty acids 1000 MG capsule Take 2 g by mouth daily.   Flax Oil-Fish Oil-Borage Oil Caps Take by mouth daily.   lisinopril 10 MG tablet Commonly known as: ZESTRIL TAKE ONE TABLET BY MOUTH DAILY   metFORMIN 500  MG tablet Commonly known as: GLUCOPHAGE Take 500 mg by mouth daily at 6 (six) AM.   MUCINEX DM PO Take 1 tablet by mouth daily.   PRESERVISION AREDS 2+MULTI VIT PO Take 1 tablet by mouth 2 (two) times daily.   ROBITUSSIN 12 HOUR COUGH PO Take 1 tablet by mouth every 6 (six) hours as needed.   Vitamin B-12 5000 MCG Subl Place under the tongue daily.   vitamin C 100 MG tablet Take 100 mg by mouth daily.   Vitamin D-3 125 MCG (5000 UT) Tabs Take by mouth daily.   vitamin E 45 MG (100 UNITS) capsule Take by mouth daily.       Review of Systems:  Review of Systems  Constitutional: Positive for appetite change and fatigue. Negative for fever.  HENT: Positive for congestion, hearing loss, postnasal drip, rhinorrhea and sore throat. Negative for sinus pressure, sinus pain, sneezing and voice change.        Dry mouth  Eyes: Negative for visual disturbance.  Respiratory: Positive for  cough. Negative for chest tightness, shortness of breath and wheezing.        Dark phlegm production.   Cardiovascular: Negative for chest pain, palpitations and leg swelling.  Gastrointestinal: Negative for abdominal pain, constipation, nausea and vomiting.  Genitourinary: Positive for frequency. Negative for dysuria, hematuria and urgency.       Average 6-7x/night.   Musculoskeletal: Positive for arthralgias, back pain and gait problem.       Right sciatica. Walks with walker and cane.   Skin: Negative for color change.  Neurological: Positive for headaches. Negative for speech difficulty and weakness.  Psychiatric/Behavioral: Positive for sleep disturbance. Negative for behavioral problems. The patient is not nervous/anxious.        Memory lapses.     Health Maintenance  Topic Date Due  . OPHTHALMOLOGY EXAM  08/20/2020  . FOOT EXAM  11/07/2020  . HEMOGLOBIN A1C  12/17/2020  . INFLUENZA VACCINE  02/08/2021  . TETANUS/TDAP  12/15/2021  . DEXA SCAN  Completed  . COVID-19 Vaccine  Completed  . PNA vac Low Risk Adult  Completed  . HPV VACCINES  Aged Out    Physical Exam: Vitals:   11/19/20 1615  BP: 132/62  Pulse: 73  Resp: 20  Temp: 97.7 F (36.5 C)  SpO2: 95%  Weight: 157 lb 3.2 oz (71.3 kg)  Height: 5\' 3"  (1.6 m)   Body mass index is 27.85 kg/m. Physical Exam Vitals and nursing note reviewed.  Constitutional:      Comments: Exhausted appearance.   HENT:     Head: Normocephalic and atraumatic.     Nose: Congestion and rhinorrhea present.     Mouth/Throat:     Mouth: Mucous membranes are dry.     Pharynx: Posterior oropharyngeal erythema present.  Eyes:     Extraocular Movements: Extraocular movements intact.     Conjunctiva/sclera: Conjunctivae normal.     Pupils: Pupils are equal, round, and reactive to light.  Cardiovascular:     Rate and Rhythm: Normal rate and regular rhythm.     Heart sounds: No murmur heard.   Pulmonary:     Effort: Pulmonary effort  is normal.     Breath sounds: No wheezing, rhonchi or rales.     Comments: Central congestion.  Abdominal:     General: Bowel sounds are normal.     Palpations: Abdomen is soft.     Tenderness: There is no abdominal tenderness.  Musculoskeletal:  Cervical back: Normal range of motion and neck supple.     Right lower leg: No edema.     Left lower leg: No edema.  Skin:    General: Skin is warm and dry.  Neurological:     General: No focal deficit present.     Mental Status: She is alert. Mental status is at baseline.     Gait: Gait abnormal.     Comments: Oriented to person, place.   Psychiatric:        Mood and Affect: Mood normal.        Behavior: Behavior normal.        Thought Content: Thought content normal.     Labs reviewed: Basic Metabolic Panel: Recent Labs    12/10/19 0710 04/14/20 0900 06/18/20 0711  NA 139 139 139  K 4.6 4.3 4.3  CL 104 103 106  CO2 25 27 24   GLUCOSE 121* 207* 147*  BUN 19 14 19   CREATININE 0.81 0.74 0.80  CALCIUM 9.5 9.6 9.5   Liver Function Tests: Recent Labs    12/10/19 0710 04/14/20 0900 06/18/20 0711  AST 15 15 17   ALT 10 15 11   BILITOT 0.8 0.7 0.7  PROT 6.5 6.8 6.7   No results for input(s): LIPASE, AMYLASE in the last 8760 hours. No results for input(s): AMMONIA in the last 8760 hours. CBC: Recent Labs    04/14/20 0900 06/18/20 0711 11/18/20 0927  WBC 7.6 7.2 9.6  NEUTROABS 3,602 2,808 5,107  HGB 13.6 13.3 14.5  HCT 40.6 38.6 43.3  MCV 91.4 91.0 91.2  PLT 313 290 252   Lipid Panel: Recent Labs    12/10/19 0710  CHOL 165  HDL 46*  LDLCALC 87  TRIG 219*  CHOLHDL 3.6   Lab Results  Component Value Date   HGBA1C 6.7 (H) 06/18/2020    Procedures since last visit: No results found.  Assessment/Plan  Diabetes mellitus type 2, controlled ([place]) takes Metformin, last Hgb a1c 6.0, concerns of renal function.    Incontinent of urine  better,  takes Oxybutynin, increased to 10mg  qd 02/28/20, but the  patient stopped taking it due to dry eyes, mouth, blurred vision.    Back pain with right-sided sciatica right sciatica, comes and goes for over a year.    Depression with anxiety Her mood has no change, not taking mood stabilizer.   Hypertension blood pressure is controlled on Lisinopril 10mg  qd, Atorvastatin 10mg  qd.   COVID-19 virus infection nasal congestion, running nose, cough, headache, sore throat, fatigue, decreased appetite for several days, rapid COVID test negative x2, but positive today in clinic. The patient is afebrile, no O2 desaturation, denied chest pain. Wbc 9.6, neutrophils 53.2% today.  ED to eval and tx.     Labs/tests ordered:  Rapid COVID  Next appt:  11/26/2020

## 2020-11-19 NOTE — Assessment & Plan Note (Signed)
blood pressure is controlled on Lisinopril 10mg qd, Atorvastatin 10mg qd. 

## 2020-11-19 NOTE — ED Notes (Signed)
Pt family called for ride. Family will arrive in 15 minutes to take pt back to [facility]

## 2020-11-19 NOTE — ED Triage Notes (Signed)
Pt BIBA from [facility]-  C/o weakness and cough x2 days.  Productive cough, tested + for covid today.

## 2020-11-19 NOTE — Assessment & Plan Note (Signed)
Her mood has no change, not taking mood stabilizer. 

## 2020-11-19 NOTE — Assessment & Plan Note (Signed)
right sciatica, comes and goes for over a year.  

## 2020-11-19 NOTE — Discharge Instructions (Addendum)
You have been diagnosed with COVID.  You have also received appropriate therapy here.  Follow-up with your physician or return here for concerning changes in your condition.

## 2020-11-19 NOTE — ED Provider Notes (Signed)
[facility] DEPT Provider Note   CSN: [id number] Arrival date & time: 11/19/20  1748     History Chief Complaint  Patient presents with  . Covid Positive    Catherine Ellis is a 87 y.o. female.  HPI Patient presents from her nursing facility with concern for COVID. She does note fatigue over the past 4 days, first noticed during a water aerobics session.  It has been persistent since that time, though without dyspnea or pain, nausea or vomiting, diarrhea or focal weakness. No medication taken for relief.  With consistency of her weakness she was brought here for evaluation.  She has received her COVID vaccines.    Past Medical History:  Diagnosis Date  . Allergy   . Arthritis   . Diabetes mellitus   . Hernia, abdominal   . Hyperlipidemia   . Hypertension   . Osteopenia, senile 09/26/2012   T score - 1.6 Left femur    Patient Active Problem List   Diagnosis Date Noted  . COVID-19 virus infection 11/19/2020  . Back pain with right-sided sciatica 04/09/2020  . Depression with anxiety 09/14/2019  . Left groin pain 08/15/2019  . Mild cognitive impairment 03/06/2018  . Overweight (BMI 25.0-29.9) 03/06/2018  . Dermatitis 05/11/2017  . Osteopenia 04/25/2017  . Weakness 09/08/2015  . Diabetes mellitus type 2, controlled ([place]) 09/08/2015  . Weakness generalized 09/08/2015  . Atrophic vaginitis 06/16/2014  . Chronic maxillary sinusitis 06/06/2014  . Microhematuria 06/05/2014  . Vitamin D deficiency 08/10/2013  . Vitamin B12 deficiency 06/14/2013  . Seasonal affective disorder ([place]) 06/14/2013  . Fatigue 06/14/2013  . Hypertension 02/22/2013  . Hyperlipidemia associated with type 2 diabetes mellitus ([place]) 02/22/2013  . Hyponatremia 02/22/2013  . Memory loss 02/22/2013  . Diabetes mellitus ([place]) 04/13/2012  . At risk for polypharmacy 04/13/2012  . Incontinent of urine 04/13/2012  . Anal irritation 04/13/2012    Past Surgical History:   Procedure Laterality Date  . APPENDECTOMY    . [place]  . EYE SURGERY    . GUM SURGERY    . KNEE SURGERY       OB History   No obstetric history on file.     Family History  Problem Relation Age of Onset  . Stroke Mother   . Heart disease Father     Social History   Tobacco Use  . Smoking status: Never Smoker  . Smokeless tobacco: Never Used  Vaping Use  . Vaping Use: Never used  Substance Use Topics  . Alcohol use: No  . Drug use: No    Home Medications Prior to Admission medications   Medication Sig Start Date End Date Taking? Authorizing Provider  Artificial Saliva (BIOTENE MOISTURIZING MOUTH) SOLN Use as directed 5 sprays in the mouth or throat 3 (three) times daily as needed. 12/05/17   Blanchie Serve, MD  Ascorbic Acid (VITAMIN C) 100 MG tablet Take 100 mg by mouth daily.    [provider]  atorvastatin (LIPITOR) 10 MG tablet TAKE 1 TABLET ONCE DAILY. 11/10/20   Virgie Dad, MD  augmented betamethasone dipropionate (DIPROLENE-AF) 0.05 % cream APPLY TO AFFECTED AREA TWICE A DAY. 08/23/19   Virgie Dad, MD  betamethasone dipropionate 0.05 % cream Apply topically 2 (two) times daily. 07/19/19   Virgie Dad, MD  Calcium Carbonate-Vitamin D3 600-400 MG-UNIT TABS Take 1 tablet by mouth 2 (two) times daily. 03/06/18   Blanchie Serve, MD  Cholecalciferol (VITAMIN D-3) 125 MCG (5000 UT) TABS Take by mouth daily.    [provider]  Cyanocobalamin (VITAMIN B-12) 5000 MCG SUBL Place under the tongue daily.    [provider]  Dextromethorphan Polistirex (ROBITUSSIN 12 HOUR COUGH PO) Take 1 tablet by mouth every 6 (six) hours as needed.    [provider]  Dextromethorphan-guaiFENesin [facility] DM PO) Take 1 tablet by mouth daily.    [provider]  fish oil-omega-3 fatty acids 1000 MG capsule Take 2 g by mouth daily.    [provider]  Flax Oil-Fish Oil-Borage Oil CAPS Take by mouth daily.     [provider]  lisinopril (ZESTRIL) 10 MG tablet TAKE ONE TABLET BY MOUTH DAILY 09/03/20   Virgie Dad, MD  metFORMIN (GLUCOPHAGE) 500 MG tablet Take 500 mg by mouth daily at 6 (six) AM.    [provider]  Multiple Vitamins-Minerals (PRESERVISION AREDS 2+MULTI VIT PO) Take 1 tablet by mouth 2 (two) times daily.    [provider]  vitamin E 100 UNIT capsule Take by mouth daily.    [provider]    Allergies    Patient has no known allergies.  Review of Systems   Review of Systems  Constitutional:       Per HPI, otherwise negative  HENT:       Per HPI, otherwise negative  Respiratory:       Per HPI, otherwise negative  Cardiovascular:       Per HPI, otherwise negative  Gastrointestinal: Negative for vomiting.  Endocrine:       Negative aside from HPI  Genitourinary:       Neg aside from HPI   Musculoskeletal:       Per HPI, otherwise negative  Skin: Negative.   Neurological: Negative for syncope.    Physical Exam Updated Vital Signs BP (!) 175/71   Pulse 75   Temp 98.6 F (37 C) (Oral)   Resp 18   Ht 5\' 3"  (1.6 m)   SpO2 98%   BMI 27.85 kg/m   Physical Exam Vitals and nursing note reviewed.  Constitutional:      General: She is not in acute distress.    Appearance: She is well-developed.  HENT:     Head: Normocephalic and atraumatic.  Eyes:     Conjunctiva/sclera: Conjunctivae normal.  Cardiovascular:     Rate and Rhythm: Normal rate and regular rhythm.  Pulmonary:     Effort: Pulmonary effort is normal. No respiratory distress.     Breath sounds: Normal breath sounds. No stridor.  Abdominal:     General: There is no distension.  Skin:    General: Skin is warm and dry.  Neurological:     Mental Status: She is alert and oriented to person, place, and time.     Cranial Nerves: No cranial nerve deficit.     ED Results / Procedures / Treatments   Labs (all labs ordered are listed, but only abnormal results are  displayed) Labs Reviewed  COMPREHENSIVE METABOLIC PANEL - Abnormal; Notable for the following components:      Result Value   Glucose, Bld 125 (*)    GFR, Estimated 54 (*)    All other components within normal limits  CBC WITH DIFFERENTIAL/PLATELET - Abnormal; Notable for the following components:   Monocytes Absolute 1.5 (*)    All other components within normal limits    EKG None  Radiology DG Chest Port 1  View  Result Date: 11/19/2020 CLINICAL DATA:  87 year old COVID positive and fatigue.  Weakness. EXAM: PORTABLE CHEST 1 VIEW COMPARISON:  08/19/2015 FINDINGS: Normal heart size. Mild aortic atherosclerosis and tortuosity. Subsegmental opacity at the lung bases. Lungs otherwise clear. No pulmonary edema, pleural effusion, or pneumothorax. No acute osseous abnormalities are seen. IMPRESSION: Subsegmental bibasilar opacities, may be atelectasis or pneumonia in the setting of COVID-19. Aortic Atherosclerosis (ICD10-I70.0). Electronically Signed   By: Keith Rake M.D.   On: 11/19/2020 18:44    Procedures Procedures   Medications Ordered in ED Medications  bebtelovimab EUA injection SOLN 175 mg (has no administration in time range)  0.9 %  sodium chloride infusion (has no administration in time range)  diphenhydrAMINE (BENADRYL) injection 50 mg (has no administration in time range)  famotidine (PEPCID) IVPB 20 mg premix (has no administration in time range)  methylPREDNISolone sodium succinate (SOLU-MEDROL) 125 mg/2 mL injection 125 mg (has no administration in time range)  albuterol (VENTOLIN HFA) 108 (90 Base) MCG/ACT inhaler 2 puff (has no administration in time range)  EPINEPHrine (EPI-PEN) injection 0.3 mg (has no administration in time range)  sodium chloride 0.9 % bolus 500 mL (500 mLs Intravenous New Bag/Given 11/19/20 1830)    ED Course  I have reviewed the triage vital signs and the nursing notes.  Pertinent labs & imaging results that were available during my care  of the patient were reviewed by me and considered in my medical decision making (see chart for details).  7:42 PM I reviewed exam the patient awake, alert, sitting upright, speaking clearly, in no distress.  She does not require oxygen.  Labs are reassuring, x-ray consistent with COVID.  Patient amenable to, appropriate for IV monoclonal antibody therapy, discharge back to her nursing facility. Final Clinical Impression(s) / ED Diagnoses Final diagnoses:  COVID    Rx / DC Orders ED Discharge Orders         Ordered    BEBTELOVIMAB 175 MG/2 ML IV (EUA)   Once        11/19/20 1914    SODIUM CHLORIDE 0.9 % IV SOLN  As needed        11/19/20 1914    DIPHENHYDRAMINE HCL 50 MG/ML IJ SOLN  Once PRN        11/19/20 1914    FAMOTIDINE IN NACL 20-0.9 MG/50ML-% IV SOLN  Once PRN        11/19/20 1914    METHYLPREDNISOLONE SODIUM SUCC 125 MG IJ SOLR  Once PRN        11/19/20 1914    ALBUTEROL SULFATE HFA 108 (90 BASE) MCG/ACT IN AERS  Once PRN        11/19/20 1914    EPINEPHRINE 0.3 MG/0.3ML IJ SOAJ  Once PRN        11/19/20 1914    Hypersensitivity GRADE 1: Transient flushing or rash, or drug fever < 100.4 F       Comments: Routine, As needed For Until specified Hold infusion for at least 30 minutes Restart infusion at 50% infusion rate if asymptomatic   11/19/20 1914    Hypersensitivity GRADE 2: Rash, flushing, urticaria, dyspnea, or drug fever = or > 100.4 F       Comments: Routine, As needed For Until specified Hold infusion for 30 minutes Give medications as ordered   11/19/20 1914    Hypersensitivity GRADE 3: symptomatic bronchospasm, with or without urticaria, parenteral medication management indicated, allergy-related edema/angioedema, or hypotension  Comments: Routine, As needed For Until specified Hold infusion for at least 30 minutes Give medications as ordered   11/19/20 1914    Hypersensitivity GRADE 4: Anaphylaxis       Comments: Routine, As needed For Until  specified Hold infusion Give medications as ordered Call MD   11/19/20 1914           Carmin Muskrat, MD 11/19/20 2257

## 2020-11-19 NOTE — Assessment & Plan Note (Signed)
takes Metformin, last Hgb a1c 6.0, concerns of renal function.

## 2020-11-20 LAB — COMPLETE METABOLIC PANEL WITH GFR
AG Ratio: 1.5 (calc) (ref 1.0–2.5)
ALT: 9 U/L (ref 6–29)
AST: 19 U/L (ref 10–35)
Albumin: 4.3 g/dL (ref 3.6–5.1)
Alkaline phosphatase (APISO): 50 U/L (ref 37–153)
BUN/Creatinine Ratio: 22 (calc) (ref 6–22)
BUN: 20 mg/dL (ref 7–25)
CO2: 23 mmol/L (ref 20–32)
Calcium: 9.3 mg/dL (ref 8.6–10.4)
Chloride: 99 mmol/L (ref 98–110)
Creat: 0.89 mg/dL — ABNORMAL HIGH (ref 0.60–0.88)
GFR, Est African American: 63 mL/min/{1.73_m2} (ref >=60)
GFR, Est Non African American: 54 mL/min/{1.73_m2} — ABNORMAL LOW (ref >=60)
Globulin: 2.9 g/dL (calc) (ref 1.9–3.7)
Glucose, Bld: 115 mg/dL — ABNORMAL HIGH (ref 65–99)
Potassium: 4.3 mmol/L (ref 3.5–5.3)
Sodium: 135 mmol/L (ref 135–146)
Total Bilirubin: 0.6 mg/dL (ref 0.2–1.2)
Total Protein: 7.2 g/dL (ref 6.1–8.1)

## 2020-11-20 LAB — LIPID PANEL
Cholesterol: 174 mg/dL (ref <200)
HDL: 50 mg/dL (ref >=50)
LDL Cholesterol (Calc): 103 mg/dL (calc) — ABNORMAL HIGH
Non-HDL Cholesterol (Calc): 124 mg/dL (calc) (ref <130)
Total CHOL/HDL Ratio: 3.5 (calc) (ref <5.0)
Triglycerides: 116 mg/dL (ref <150)

## 2020-11-20 LAB — CBC WITH DIFFERENTIAL/PLATELET
Absolute Monocytes: 1498 cells/uL — ABNORMAL HIGH (ref 200–950)
Basophils Absolute: 58 cells/uL (ref 0–200)
Basophils Relative: 0.6 %
Eosinophils Absolute: 10 cells/uL — ABNORMAL LOW (ref 15–500)
Eosinophils Relative: 0.1 %
HCT: 43.3 % (ref 35.0–45.0)
Hemoglobin: 14.5 g/dL (ref 11.7–15.5)
Lymphs Abs: 2928 cells/uL (ref 850–3900)
MCH: 30.5 pg (ref 27.0–33.0)
MCHC: 33.5 g/dL (ref 32.0–36.0)
MCV: 91.2 fL (ref 80.0–100.0)
MPV: 11.2 fL (ref 7.5–12.5)
Monocytes Relative: 15.6 %
Neutro Abs: 5107 cells/uL (ref 1500–7800)
Neutrophils Relative %: 53.2 %
Platelets: 252 10*3/uL (ref 140–400)
RBC: 4.75 10*6/uL (ref 3.80–5.10)
RDW: 12.8 % (ref 11.0–15.0)
Total Lymphocyte: 30.5 %
WBC: 9.6 10*3/uL (ref 3.8–10.8)

## 2020-11-20 LAB — HEMOGLOBIN A1C
Hgb A1c MFr Bld: 6.5 % of total Hgb — ABNORMAL HIGH (ref <5.7)
Mean Plasma Glucose: 140 mg/dL
eAG (mmol/L): 7.7 mmol/L

## 2020-11-20 NOTE — Progress Notes (Signed)
Received order for [facility] - patient resides in nursing home- unable to provide transportation today and patient will be out of window our next operating day of Monday 5/16

## 2020-11-25 ENCOUNTER — Telehealth: Payer: Self-pay | Admitting: Nurse Practitioner

## 2020-11-26 ENCOUNTER — Encounter: Payer: Self-pay | Admitting: Nurse Practitioner

## 2020-12-01 ENCOUNTER — Encounter: Payer: Self-pay | Admitting: Nurse Practitioner

## 2020-12-03 ENCOUNTER — Non-Acute Institutional Stay: Payer: Medicare Other | Admitting: Nurse Practitioner

## 2020-12-03 ENCOUNTER — Encounter: Payer: Self-pay | Admitting: Nurse Practitioner

## 2020-12-03 ENCOUNTER — Other Ambulatory Visit: Payer: Self-pay

## 2020-12-03 DIAGNOSIS — F418 Other specified anxiety disorders: Secondary | ICD-10-CM | POA: Diagnosis not present

## 2020-12-03 DIAGNOSIS — I10 Essential (primary) hypertension: Secondary | ICD-10-CM

## 2020-12-03 DIAGNOSIS — M5431 Sciatica, right side: Secondary | ICD-10-CM | POA: Diagnosis not present

## 2020-12-03 DIAGNOSIS — U071 COVID-19: Secondary | ICD-10-CM | POA: Diagnosis not present

## 2020-12-03 DIAGNOSIS — E1121 Type 2 diabetes mellitus with diabetic nephropathy: Secondary | ICD-10-CM

## 2020-12-03 DIAGNOSIS — N3942 Incontinence without sensory awareness: Secondary | ICD-10-CM | POA: Diagnosis not present

## 2020-12-03 MED ORDER — AZITHROMYCIN 250 MG PO TABS
ORAL_TABLET | ORAL | 0 refills | Status: AC
Start: 2020-12-03 — End: 2020-12-08

## 2020-12-03 MED ORDER — GUAIFENESIN ER 600 MG PO TB12: 600.0000 mg | ORAL_TABLET | Freq: Two times a day (BID) | ORAL | 1 refills | Status: DC

## 2020-12-03 NOTE — Assessment & Plan Note (Signed)
takes Metformin, last Hgb a1c 6.5 11/19/20, concerns of renal function.

## 2020-12-03 NOTE — Progress Notes (Signed)
Location:   clinic [facility]   Place of Service:  Clinic (12) Provider: Marlana Latus NP  Code Status: DNR Goals of Care: [place] 12/03/2020  Does Patient Have a Medical Advance Directive? No  Type of Advance Directive -  Does patient want to make changes to medical advance directive? -  Copy of [address] in Chart? -  Would patient like information on creating a medical advance directive? No - Patient declined  Pre-existing out of facility DNR order (yellow form or pink MOST form) -     Chief Complaint  Patient presents with  . Medical Management of Chronic Issues    5 month follow up. Patient had covid recently and states she is still coughing up yellow flem, and having some difficulty sleeping.     HPI: Patient is a 87 y.o. female seen today for medical management of chronic diseases.    Yellow phlegm, congestive cough, denied chest pain, SOB, palpitation, she is afebrile, no O2 desaturation.   COVID infection since 11/19/20, fatigue, nasal congestion, received Bebtelovimab, Methylprednisone in ED  HTN, blood pressure is controlled on Lisinopril 22m qd, Atorvastatin 152mqd.  Her mood has no change, not taking mood stabilizer.  T2DM, takes Metformin, last Hgb a1c 6.5 11/19/20, concerns of renal function. Urinary frequency, better,takes Oxybutynin, increased to 1042md 02/28/20, but the patient stopped taking it due to dry eyes, mouth, blurred vision.  Lower back pain, right sciatica, comes and goes for over a year.     Past Medical History:  Diagnosis Date  . Allergy   . Arthritis   . Diabetes mellitus   . Hernia, abdominal   . Hyperlipidemia   . Hypertension   . Osteopenia, senile 09/26/2012   T score - 1.6 Left femur    Past Surgical History:  Procedure Laterality Date  . APPENDECTOMY    . [place] EYE SURGERY    . GUM SURGERY    . KNEE SURGERY      No Known  Allergies  Allergies as of 12/03/2020   No Known Allergies     Medication List       Accurate as of Dec 03, 2020 11:59 PM. If you have any questions, ask your nurse or doctor.        atorvastatin 10 MG tablet Commonly known as: LIPITOR TAKE 1 TABLET ONCE DAILY.   augmented betamethasone dipropionate 0.05 % cream Commonly known as: DIPROLENE-AF APPLY TO AFFECTED AREA TWICE A DAY.   azithromycin 250 MG tablet Commonly known as: ZITHROMAX Take 2 tablets on day 1, then 1 tablet daily on days 2 through 5 Started by: Javien Tesch X [place] Jolin, NP   betamethasone dipropionate 0.05 % cream Apply topically 2 (two) times daily.   Biotene Moisturizing Mouth Soln Use as directed 5 sprays in the mouth or throat 3 (three) times daily as needed.   Calcium Carbonate-Vitamin D3 600-400 MG-UNIT Tabs Take 1 tablet by mouth 2 (two) times daily.   fish oil-omega-3 fatty acids 1000 MG capsule Take 2 g by mouth daily.   Flax Oil-Fish Oil-Borage Oil Caps Take by mouth daily.   guaiFENesin 600 MG 12 hr tablet Commonly known as: Mucinex Take 1 tablet (600 mg total) by mouth 2 (two) times daily. Started by: Logun Colavito X Jaiden Wahab, NP   lisinopril 10 MG tablet Commonly known as: ZESTRIL TAKE ONE TABLET BY MOUTH DAILY   metFORMIN 500 MG tablet Commonly known as:  GLUCOPHAGE Take 500 mg by mouth daily at 6 (six) AM.   MUCINEX DM PO Take 1 tablet by mouth daily.   PRESERVISION AREDS 2+MULTI VIT PO Take 1 tablet by mouth 2 (two) times daily.   ROBITUSSIN 12 HOUR COUGH PO Take 1 tablet by mouth every 6 (six) hours as needed.   Vitamin B-12 5000 MCG Subl Place under the tongue daily.   vitamin C 100 MG tablet Take 100 mg by mouth daily.   Vitamin D-3 125 MCG (5000 UT) Tabs Take by mouth daily.   vitamin E 45 MG (100 UNITS) capsule Take by mouth daily.       Review of Systems:  Review of Systems  Constitutional: Positive for appetite change and fatigue. Negative for fever.  HENT: Positive for  hearing loss. Negative for congestion, postnasal drip, rhinorrhea and sore throat.   Eyes: Negative for visual disturbance.  Respiratory: Positive for cough. Negative for chest tightness, shortness of breath and wheezing.        Dark phlegm production.   Cardiovascular: Negative for chest pain, palpitations and leg swelling.  Gastrointestinal: Negative for abdominal pain, constipation, nausea and vomiting.  Genitourinary: Positive for frequency. Negative for dysuria, hematuria and urgency.       Average 6-7x/night.   Musculoskeletal: Positive for arthralgias, back pain and gait problem.       Right sciatica. Walks with walker and cane.   Skin: Negative for color change.  Neurological: Negative for speech difficulty, weakness and headaches.  Psychiatric/Behavioral: Positive for sleep disturbance. Negative for behavioral problems. The patient is not nervous/anxious.        Memory lapses.     Health Maintenance  Topic Date Due  . Zoster Vaccines- Shingrix (1 of 2) Never done  . OPHTHALMOLOGY EXAM  08/20/2020  . FOOT EXAM  11/07/2020  . INFLUENZA VACCINE  02/08/2021  . HEMOGLOBIN A1C  05/21/2021  . TETANUS/TDAP  12/15/2021  . DEXA SCAN  Completed  . COVID-19 Vaccine  Completed  . PNA vac Low Risk Adult  Completed  . HPV VACCINES  Aged Out    Physical Exam: Vitals:   12/03/20 1319  BP: (!) 148/72  Pulse: 97  Resp: 18  Temp: 97.7 F (36.5 C)  SpO2: 97%  Weight: 160 lb 3.2 oz (72.7 kg)  Height: _0  (1.6 m)   Body mass index is 28.38 kg/m. Physical Exam Vitals and nursing note reviewed.  Constitutional:      Comments: Exhausted appearance.   HENT:     Head: Normocephalic and atraumatic.     Nose: No congestion or rhinorrhea.     Mouth/Throat:     Mouth: Mucous membranes are dry.  Eyes:     Extraocular Movements: Extraocular movements intact.     Conjunctiva/sclera: Conjunctivae normal.     Pupils: Pupils are equal, round, and reactive to light.  Cardiovascular:      Rate and Rhythm: Normal rate and regular rhythm.     Heart sounds: No murmur heard.   Pulmonary:     Effort: Pulmonary effort is normal.     Breath sounds: Rales present. No wheezing or rhonchi.     Comments: Central congestion. Posterior lung base rales.  Abdominal:     General: Bowel sounds are normal.     Palpations: Abdomen is soft.     Tenderness: There is no abdominal tenderness.  Musculoskeletal:     Cervical back: Normal range of motion and neck supple.     Right  lower leg: No edema.     Left lower leg: No edema.  Skin:    General: Skin is warm and dry.     Comments: Lateral left lower leg rash, treated by dermatology.   Neurological:     General: No focal deficit present.     Mental Status: She is alert. Mental status is at baseline.     Gait: Gait abnormal.     Comments: Oriented to person, place.   Psychiatric:        Mood and Affect: Mood normal.        Behavior: Behavior normal.        Thought Content: Thought content normal.     Labs reviewed: Basic Metabolic Panel: Recent Labs    06/18/20 0711 11/18/20 0927 11/19/20 1818  NA 139 135 136  K 4.3 4.3 4.1  CL 106 99 102  CO2 _0 GLUCOSE 147* 115* 125*  BUN _1 CREATININE 0.80 0.89* 0.96  CALCIUM 9.5 9.3 9.2   Liver Function Tests: Recent Labs    06/18/20 0711 11/18/20 0927 11/19/20 1818  AST _2 ALT _3 ALKPHOS  --   --  44  BILITOT 0.7 0.6 0.5  PROT 6.7 7.2 7.2  ALBUMIN  --   --  3.9   No results for input(s): LIPASE, AMYLASE in the last 8760 hours. No results for input(s): AMMONIA in the last 8760 hours. CBC: Recent Labs    06/18/20 0711 11/18/20 0927 11/19/20 1818  WBC 7.2 9.6 8.6  NEUTROABS 2,808 5,107 4.0  HGB 13.3 14.5 13.9  HCT 38.6 43.3 41.6  MCV 91.0 91.2 92.9  PLT 290 252 252   Lipid Panel: Recent Labs    12/10/19 0710 11/18/20 0927  CHOL 165 174  HDL 46* 50  LDLCALC 87 103*  TRIG 219* 116  CHOLHDL 3.6 3.5   Lab Results  Component Value  Date   HGBA1C 6.5 (H) 11/18/2020    Procedures since last visit: DG Chest Port 1 View  Result Date: 11/19/2020 CLINICAL DATA:  87 year old COVID positive and fatigue.  Weakness. EXAM: PORTABLE CHEST 1 VIEW COMPARISON:  08/19/2015 FINDINGS: Normal heart size. Mild aortic atherosclerosis and tortuosity. Subsegmental opacity at the lung bases. Lungs otherwise clear. No pulmonary edema, pleural effusion, or pneumothorax. No acute osseous abnormalities are seen. IMPRESSION: Subsegmental bibasilar opacities, may be atelectasis or pneumonia in the setting of COVID-19. Aortic Atherosclerosis (ICD10-I70.0). Electronically Signed   By: Keith Rake M.D.   On: 11/19/2020 18:44    Assessment/Plan  COVID-19 virus infection COVID infection since 11/19/20, fatigue, nasal congestion, received Bebtelovimab, Methylprednisone in ED  Yellow phlegm, congestive cough, denied chest pain, SOB, palpitation, she is afebrile, no O2 desaturation. ? Bacteria 2nd to COVID, will try Azithromycin x 5 days alone with Mucinex. May consider CXR if no better.   Hypertension blood pressure is controlled on Lisinopril 39m qd, Atorvastatin 156mqd.  Depression with anxiety Her mood has no change, not taking mood stabilizer.    Diabetes mellitus type 2, controlled (HCPolktakes Metformin, last Hgb a1c 6.5 11/19/20, concerns of renal function.   Incontinent of urine better,takes Oxybutynin, increased to 1037md 02/28/20, but the patient stopped taking it due to dry eyes, mouth, blurred vision.    Back pain with right-sided sciatica Lower back pain, right sciatica, comes and goes for over a year.     Labs/tests ordered:  CBC/diff, CMP/eGFR  Next appt:  2 weeks.

## 2020-12-03 NOTE — Assessment & Plan Note (Addendum)
COVID infection since 11/19/20, fatigue, nasal congestion, received Bebtelovimab, Methylprednisone in ED  Yellow phlegm, congestive cough, denied chest pain, SOB, palpitation, she is afebrile, no O2 desaturation. ? Bacteria 2nd to COVID, will try Azithromycin x 5 days alone with Mucinex. May consider CXR if no better.

## 2020-12-03 NOTE — Assessment & Plan Note (Signed)
Lower back pain, right sciatica, comes and goes for over a year.  

## 2020-12-03 NOTE — Assessment & Plan Note (Signed)
better,takes Oxybutynin, increased to 10mg  qd 02/28/20, but the patient stopped taking it due to dry eyes, mouth, blurred vision.

## 2020-12-03 NOTE — Assessment & Plan Note (Signed)
Her mood has no change, not taking mood stabilizer.

## 2020-12-03 NOTE — Assessment & Plan Note (Signed)
blood pressure is controlled on Lisinopril 10mg  qd, Atorvastatin 10mg  qd.

## 2020-12-04 ENCOUNTER — Encounter: Payer: Self-pay | Admitting: Nurse Practitioner

## 2020-12-08 DIAGNOSIS — Z23 Encounter for immunization: Secondary | ICD-10-CM | POA: Diagnosis not present

## 2020-12-17 ENCOUNTER — Other Ambulatory Visit: Payer: Self-pay

## 2020-12-17 ENCOUNTER — Encounter: Payer: Medicare Other | Admitting: Nurse Practitioner

## 2020-12-17 DIAGNOSIS — U071 COVID-19: Secondary | ICD-10-CM

## 2020-12-17 DIAGNOSIS — L280 Lichen simplex chronicus: Secondary | ICD-10-CM | POA: Diagnosis not present

## 2020-12-17 DIAGNOSIS — D1801 Hemangioma of skin and subcutaneous tissue: Secondary | ICD-10-CM | POA: Diagnosis not present

## 2020-12-17 DIAGNOSIS — L814 Other melanin hyperpigmentation: Secondary | ICD-10-CM | POA: Diagnosis not present

## 2020-12-17 DIAGNOSIS — F418 Other specified anxiety disorders: Secondary | ICD-10-CM

## 2020-12-17 DIAGNOSIS — L853 Xerosis cutis: Secondary | ICD-10-CM | POA: Diagnosis not present

## 2020-12-17 DIAGNOSIS — N3942 Incontinence without sensory awareness: Secondary | ICD-10-CM

## 2020-12-17 DIAGNOSIS — E1121 Type 2 diabetes mellitus with diabetic nephropathy: Secondary | ICD-10-CM

## 2020-12-17 DIAGNOSIS — L821 Other seborrheic keratosis: Secondary | ICD-10-CM | POA: Diagnosis not present

## 2020-12-17 DIAGNOSIS — L259 Unspecified contact dermatitis, unspecified cause: Secondary | ICD-10-CM | POA: Diagnosis not present

## 2020-12-17 DIAGNOSIS — I10 Essential (primary) hypertension: Secondary | ICD-10-CM

## 2020-12-17 DIAGNOSIS — M5431 Sciatica, right side: Secondary | ICD-10-CM

## 2020-12-17 NOTE — Progress Notes (Signed)
This encounter was created in error - please disregard.

## 2020-12-17 NOTE — Assessment & Plan Note (Signed)
akes Metformin, last Hgb a1c 6.5 11/19/20, concerns of renal function.

## 2020-12-17 NOTE — Assessment & Plan Note (Signed)
Lower back pain, right sciatica, comes and goes for over a year.  

## 2020-12-17 NOTE — Assessment & Plan Note (Signed)
Her mood has no change, not taking mood stabilizer.

## 2020-12-17 NOTE — Assessment & Plan Note (Signed)
Treated for yellow phlegm, congestive cough, denied chest pain, SOB, palpitation, she is afebrile, no O2 desaturation with ? Bacteria 2nd to COVID, Azithromycin x 5 days alone with Mucinex. May consider CXR if no better. Improved.              COVID infection since 11/19/20, fatigue, nasal congestion, received Bebtelovimab, Methylprednisone in ED

## 2020-12-17 NOTE — Assessment & Plan Note (Signed)
blood pressure is controlled on Lisinopril 10mg  qd, Atorvastatin 10mg  qd. Bun/creat 23/0.96 11/19/20

## 2020-12-17 NOTE — Assessment & Plan Note (Signed)
takes Oxybutynin, increased to 10mg  qd 02/28/20, but the patient stopped taking it due to dry eyes, mouth, blurred vision.

## 2020-12-24 ENCOUNTER — Other Ambulatory Visit: Payer: Self-pay

## 2020-12-24 ENCOUNTER — Ambulatory Visit
Admission: RE | Admit: 2020-12-24 | Discharge: 2020-12-24 | Disposition: A | Discharge status: Home / Self Care | Payer: Medicare Other | Source: Ambulatory Visit | Attending: Nurse Practitioner | Admitting: Nurse Practitioner

## 2020-12-24 DIAGNOSIS — M8589 Other specified disorders of bone density and structure, multiple sites: Secondary | ICD-10-CM | POA: Diagnosis not present

## 2020-12-24 DIAGNOSIS — E2839 Other primary ovarian failure: Secondary | ICD-10-CM

## 2020-12-24 DIAGNOSIS — Z780 Asymptomatic menopausal state: Secondary | ICD-10-CM | POA: Diagnosis not present

## 2021-01-05 ENCOUNTER — Encounter: Payer: Self-pay | Admitting: Nurse Practitioner

## 2021-01-07 ENCOUNTER — Other Ambulatory Visit: Payer: Self-pay | Admitting: Nurse Practitioner

## 2021-01-07 DIAGNOSIS — M8580 Other specified disorders of bone density and structure, unspecified site: Secondary | ICD-10-CM

## 2021-01-07 MED ORDER — ALENDRONATE SODIUM 70 MG PO TABS: 70.0000 mg | ORAL_TABLET | ORAL | 11 refills | Status: AC

## 2021-01-07 NOTE — Progress Notes (Signed)
Order sent.

## 2021-01-19 DIAGNOSIS — L820 Inflamed seborrheic keratosis: Secondary | ICD-10-CM | POA: Diagnosis not present

## 2021-01-19 DIAGNOSIS — L821 Other seborrheic keratosis: Secondary | ICD-10-CM | POA: Diagnosis not present

## 2021-01-19 DIAGNOSIS — L299 Pruritus, unspecified: Secondary | ICD-10-CM | POA: Diagnosis not present

## 2021-01-19 DIAGNOSIS — R21 Rash and other nonspecific skin eruption: Secondary | ICD-10-CM | POA: Diagnosis not present

## 2021-01-19 DIAGNOSIS — D1801 Hemangioma of skin and subcutaneous tissue: Secondary | ICD-10-CM | POA: Diagnosis not present

## 2021-01-19 DIAGNOSIS — L570 Actinic keratosis: Secondary | ICD-10-CM | POA: Diagnosis not present

## 2021-01-19 DIAGNOSIS — L814 Other melanin hyperpigmentation: Secondary | ICD-10-CM | POA: Diagnosis not present

## 2021-02-02 ENCOUNTER — Other Ambulatory Visit: Payer: Self-pay | Admitting: Internal Medicine

## 2021-02-10 DIAGNOSIS — L814 Other melanin hyperpigmentation: Secondary | ICD-10-CM | POA: Diagnosis not present

## 2021-02-10 DIAGNOSIS — L249 Irritant contact dermatitis, unspecified cause: Secondary | ICD-10-CM | POA: Diagnosis not present

## 2021-02-10 DIAGNOSIS — L853 Xerosis cutis: Secondary | ICD-10-CM | POA: Diagnosis not present

## 2021-03-07 ENCOUNTER — Other Ambulatory Visit: Payer: Self-pay | Admitting: Internal Medicine

## 2021-04-26 DIAGNOSIS — Z23 Encounter for immunization: Secondary | ICD-10-CM | POA: Diagnosis not present

## 2021-05-25 DIAGNOSIS — Z961 Presence of intraocular lens: Secondary | ICD-10-CM | POA: Diagnosis not present

## 2021-05-25 DIAGNOSIS — H353132 Nonexudative age-related macular degeneration, bilateral, intermediate dry stage: Secondary | ICD-10-CM | POA: Diagnosis not present

## 2021-05-25 DIAGNOSIS — E119 Type 2 diabetes mellitus without complications: Secondary | ICD-10-CM | POA: Diagnosis not present

## 2021-05-25 DIAGNOSIS — H52203 Unspecified astigmatism, bilateral: Secondary | ICD-10-CM | POA: Diagnosis not present

## 2021-06-01 ENCOUNTER — Other Ambulatory Visit: Payer: Self-pay | Admitting: Internal Medicine

## 2021-06-01 NOTE — Telephone Encounter (Signed)
Manxie please advise on when patient should schedule next routine follow-up. Patient has no pending appointments wit [facility].

## 2021-06-02 NOTE — Telephone Encounter (Signed)
ManX Response:  Mast, Man X, NP  You 17 hours ago (4:59 PM)   An appointment in clinic [facility] in the next 2-3 months will be good. Thanks.     Spoke with patient and patient states she would like to be seen sooner than later. Appt scheduled for 06/10/21

## 2021-06-10 ENCOUNTER — Other Ambulatory Visit: Payer: Self-pay

## 2021-06-10 ENCOUNTER — Non-Acute Institutional Stay: Payer: Medicare Other | Admitting: Nurse Practitioner

## 2021-06-10 DIAGNOSIS — N3942 Incontinence without sensory awareness: Secondary | ICD-10-CM | POA: Diagnosis not present

## 2021-06-10 DIAGNOSIS — E785 Hyperlipidemia, unspecified: Secondary | ICD-10-CM | POA: Diagnosis not present

## 2021-06-10 DIAGNOSIS — I10 Essential (primary) hypertension: Secondary | ICD-10-CM | POA: Diagnosis not present

## 2021-06-10 DIAGNOSIS — F418 Other specified anxiety disorders: Secondary | ICD-10-CM | POA: Diagnosis not present

## 2021-06-10 DIAGNOSIS — E1121 Type 2 diabetes mellitus with diabetic nephropathy: Secondary | ICD-10-CM

## 2021-06-10 DIAGNOSIS — M810 Age-related osteoporosis without current pathological fracture: Secondary | ICD-10-CM | POA: Diagnosis not present

## 2021-06-10 DIAGNOSIS — E1169 Type 2 diabetes mellitus with other specified complication: Secondary | ICD-10-CM | POA: Diagnosis not present

## 2021-06-10 DIAGNOSIS — M5431 Sciatica, right side: Secondary | ICD-10-CM

## 2021-06-10 MED ORDER — TRAZODONE HCL 50 MG PO TABS: 25.0000 mg | ORAL_TABLET | Freq: Every evening | ORAL | 3 refills | Status: DC | PRN

## 2021-06-10 NOTE — Assessment & Plan Note (Signed)
stopped taking Oxybutynin due to dry eyes, mouth, blurred vision.

## 2021-06-10 NOTE — Assessment & Plan Note (Signed)
takes Metformin, last Hgb a1c 6.5 11/19/20

## 2021-06-10 NOTE — Assessment & Plan Note (Signed)
blood pressure is controlled on Lisinopril, Atorvastatin 

## 2021-06-10 NOTE — Assessment & Plan Note (Signed)
Bathroom every 2-3 hours, woke up feeling itching but not during day, saw dermatology and ordered Vaseline applying to skin, a few red papule spot left shin-not itching.

## 2021-06-10 NOTE — Assessment & Plan Note (Signed)
takes Alendronate. Ca, Vit D, 12/24/20 DEXA t score -2.6

## 2021-06-10 NOTE — Assessment & Plan Note (Signed)
takes Atorvastatin, fish oil, LDL 103 11/18/20 

## 2021-06-10 NOTE — Progress Notes (Signed)
Location:   clinic [place]   Place of Service:   clinic [place] Provider: Marlana Latus NP  Code Status: DNR Goals of Care: [place] Advanced [place] 06/10/2021  Does Patient Have a Medical Advance Directive? Yes  Type of Paramedic of [place];Living will  Does patient want to make changes to medical advance directive? Yes (ED - send information to MyChart)  Copy of [place] in Chart? No - copy requested  Would patient like information on creating a medical advance directive? -  Pre-existing out of facility DNR order (yellow form or pink MOST form) -     Chief Complaint  Patient presents with   Medical Management of Chronic Issues    Routine follow-up and discus skin issues (seeing a dermatologist). Discuss need for eye exam and flu vaccine or postpone if patient refuses. Foot exam today      HPI: Patient is a 87 y.o. female seen today for medical management of chronic diseases.    HTN, blood pressure is controlled on Lisinopril, Atorvastatin             Her mood has no change, not taking mood stabilizer.              T2DM, takes Metformin, last Hgb a1c 6.5 11/19/20             Urinary frequency, stopped taking Oxybutynin due to dry eyes, mouth, blurred vision.              Lower back pain, right sciatica, comes and goes for over a year.   OP takes Alendronate. Ca, Vit D. DEXA 12/24/20 t score -2.6  Hyperlipidemia, takes Atorvastatin, fish oil, LDL 103 11/18/20    Past Medical History:  Diagnosis Date   Allergy    Arthritis    Diabetes mellitus    Hernia, abdominal    Hyperlipidemia    Hypertension    Osteopenia, senile 09/26/2012   T score - 1.6 Left femur    Past Surgical History:  Procedure Laterality Date   APPENDECTOMY     BACK SURGERY     1965   EYE SURGERY     GUM SURGERY     KNEE SURGERY      No Known Allergies  Allergies as of 06/10/2021   No Known Allergies      Medication List        Accurate as of June 10, 2021 11:59 PM. If you have any questions, ask your nurse or doctor.          STOP taking these medications    augmented betamethasone dipropionate 0.05 % cream Commonly known as: DIPROLENE-AF Stopped by: Hieu Herms X Armine Rizzolo, NP   betamethasone dipropionate 0.05 % cream Stopped by: Brynn Mulgrew X Lawrence Roldan, NP   metFORMIN 500 MG tablet Commonly known as: GLUCOPHAGE Stopped by: Ambrosia Wisnewski X Cecila Satcher, NP       TAKE these medications    alendronate 70 MG tablet Commonly known as: Fosamax Take 1 tablet (70 mg total) by mouth every 7 (seven) days. Take with a full glass of water on an empty stomach.   atorvastatin 10 MG tablet Commonly known as: LIPITOR TAKE ONE TABLET BY MOUTH ONCE DAILY   Biotene Moisturizing Mouth Soln Use as directed 5 sprays in the mouth or throat 3 (three) times daily as needed.   Calcium Carbonate-Vitamin D3 600-400 MG-UNIT Tabs Take 1 tablet by mouth 2 (two) times daily.   fish oil-omega-3 fatty acids  1000 MG capsule Take 2 g by mouth daily.   Flax Oil-Fish Oil-Borage Oil Caps Take by mouth daily.   guaiFENesin 600 MG 12 hr tablet Commonly known as: Mucinex Take 1 tablet (600 mg total) by mouth 2 (two) times daily.   lisinopril 10 MG tablet Commonly known as: ZESTRIL TAKE ONE TABLET BY MOUTH DAILY   magnesium oxide 400 MG tablet Commonly known as: MAG-OX Take 400 mg by mouth daily.   MUCINEX DM PO Take 1 tablet by mouth as needed.   PRESERVISION AREDS 2+MULTI VIT PO Take 1 tablet by mouth 2 (two) times daily.   ROBITUSSIN 12 HOUR COUGH PO Take 1 tablet by mouth every 6 (six) hours as needed.   traZODone 50 MG tablet Commonly known as: DESYREL Take 0.5 tablets (25 mg total) by mouth at bedtime as needed for sleep. Started by: Jeffree Cazeau X Bakari Nikolai, NP   Vitamin B-12 5000 MCG Subl Place under the tongue daily.   vitamin C 500 MG tablet Commonly known as: ASCORBIC ACID Take 500 mg by mouth daily. What changed: Another medication with the same name was removed.  Continue taking this medication, and follow the directions you see here. Changed by: Nihira Puello X Melesio Madara, NP   Vitamin D3 50 MCG (2000 UT) Tabs Take 1 tablet by mouth daily. What changed: Another medication with the same name was removed. Continue taking this medication, and follow the directions you see here. Changed by: Faydra Korman X Laveah Gloster, NP   vitamin E 45 MG (100 UNITS) capsule Take by mouth daily.        Review of Systems:  Review of Systems  Constitutional:  Negative for appetite change, fatigue and fever.  HENT:  Positive for hearing loss. Negative for congestion, postnasal drip, rhinorrhea and sore throat.   Eyes:  Negative for visual disturbance.  Respiratory:  Positive for cough. Negative for chest tightness, shortness of breath and wheezing.        Dark phlegm production.   Cardiovascular:  Negative for chest pain, palpitations and leg swelling.  Gastrointestinal:  Negative for abdominal pain, constipation, nausea and vomiting.  Genitourinary:  Positive for frequency. Negative for dysuria, hematuria and urgency.       Average 6-7x/night.   Musculoskeletal:  Positive for arthralgias, back pain and gait problem.       Right sciatica. Walks with walker and cane.   Skin:  Negative for color change.  Neurological:  Negative for speech difficulty, weakness and headaches.  Psychiatric/Behavioral:  Positive for sleep disturbance. Negative for behavioral problems. The patient is not nervous/anxious.        Memory lapses.    Health Maintenance  Topic Date Due   OPHTHALMOLOGY EXAM  08/20/2020   FOOT EXAM  11/07/2020   INFLUENZA VACCINE  02/08/2021   HEMOGLOBIN A1C  05/21/2021   COVID-19 Vaccine (4 - Booster for Moderna series) 06/26/2021 (Originally 07/14/2020)   Zoster Vaccines- Shingrix (1 of 2) 09/08/2021 (Originally 10/04/1971)   TETANUS/TDAP  12/15/2021   Pneumonia Vaccine 92+ Years old  Completed   DEXA SCAN  Completed   HPV VACCINES  Aged Out    Physical Exam: Vitals:   06/10/21  1525  BP: 130/62  Pulse: 85  Temp: (!) 97.3 F (36.3 C)  SpO2: (!) 85%  Weight: 156 lb (70.8 kg)  Height: '5\' 3"'  (1.6 m)   Body mass index is 27.63 kg/m. Physical Exam Vitals and nursing note reviewed.  Constitutional:      Comments: Exhausted appearance.  HENT:     Head: Normocephalic and atraumatic.     Nose: No congestion or rhinorrhea.     Mouth/Throat:     Mouth: Mucous membranes are dry.  Eyes:     Extraocular Movements: Extraocular movements intact.     Conjunctiva/sclera: Conjunctivae normal.     Pupils: Pupils are equal, round, and reactive to light.  Cardiovascular:     Rate and Rhythm: Normal rate and regular rhythm.     Heart sounds: No murmur heard. Pulmonary:     Effort: Pulmonary effort is normal.     Breath sounds: Rales present. No wheezing or rhonchi.     Comments: Central congestion. Posterior lung base rales.  Abdominal:     General: Bowel sounds are normal.     Palpations: Abdomen is soft.     Tenderness: There is no abdominal tenderness.  Musculoskeletal:     Cervical back: Normal range of motion and neck supple.     Right lower leg: No edema.     Left lower leg: No edema.  Skin:    General: Skin is warm and dry.     Comments: Lateral left lower leg rash, treated by dermatology.   Neurological:     General: No focal deficit present.     Mental Status: She is alert. Mental status is at baseline.     Gait: Gait abnormal.     Comments: Oriented to person, place.   Psychiatric:        Mood and Affect: Mood normal.        Behavior: Behavior normal.        Thought Content: Thought content normal.    Labs reviewed: Basic Metabolic Panel: Recent Labs    06/18/20 0711 11/18/20 0927 11/19/20 1818  NA 139 135 136  K 4.3 4.3 4.1  CL 106 99 102  CO2 '24 23 24  ' GLUCOSE 147* 115* 125*  BUN '19 20 23  ' CREATININE 0.80 0.89* 0.96  CALCIUM 9.5 9.3 9.2   Liver Function Tests: Recent Labs    06/18/20 0711 11/18/20 0927 11/19/20 1818  AST '17 19  20  ' ALT '11 9 13  ' ALKPHOS  --   --  44  BILITOT 0.7 0.6 0.5  PROT 6.7 7.2 7.2  ALBUMIN  --   --  3.9   No results for input(s): LIPASE, AMYLASE in the last 8760 hours. No results for input(s): AMMONIA in the last 8760 hours. CBC: Recent Labs    06/18/20 0711 11/18/20 0927 11/19/20 1818  WBC 7.2 9.6 8.6  NEUTROABS 2,808 5,107 4.0  HGB 13.3 14.5 13.9  HCT 38.6 43.3 41.6  MCV 91.0 91.2 92.9  PLT 290 252 252   Lipid Panel: Recent Labs    11/18/20 0927  CHOL 174  HDL 50  LDLCALC 103*  TRIG 116  CHOLHDL 3.5   Lab Results  Component Value Date   HGBA1C 6.5 (H) 11/18/2020    Procedures since last visit: No results found.  Assessment/Plan  Hypertension blood pressure is controlled on Lisinopril, Atorvastatin  Diabetes mellitus type 2, controlled ([place])  takes Metformin, last Hgb a1c 6.5 11/19/20  Incontinent of urine stopped taking Oxybutynin due to dry eyes, mouth, blurred vision.   Back pain with right-sided sciatica  right sciatica, comes and goes for over a year.   Osteoporosis  takes Alendronate. Ca, Vit D, 12/24/20 DEXA t score -2.6  Hyperlipidemia associated with type 2 diabetes mellitus ([place])  takes Atorvastatin, fish oil, LDL 103  11/18/20  Depression with anxiety Bathroom every 2-3 hours, woke up feeling itching but not during day, saw dermatology and ordered Vaseline applying to skin, a few red papule spot left shin-not itching.       Labs/tests ordered:  CBC/diff, CMP/eGFR, TSH, Lipid panel, Hgb a1c  Next appt:  3 months

## 2021-06-10 NOTE — Assessment & Plan Note (Signed)
right sciatica, comes and goes for over a year.

## 2021-06-11 ENCOUNTER — Encounter: Payer: Self-pay | Admitting: Nurse Practitioner

## 2021-07-28 ENCOUNTER — Other Ambulatory Visit: Payer: Self-pay | Admitting: Nurse Practitioner

## 2021-08-24 DIAGNOSIS — L853 Xerosis cutis: Secondary | ICD-10-CM | POA: Diagnosis not present

## 2021-08-24 DIAGNOSIS — L219 Seborrheic dermatitis, unspecified: Secondary | ICD-10-CM | POA: Diagnosis not present

## 2021-08-24 DIAGNOSIS — L570 Actinic keratosis: Secondary | ICD-10-CM | POA: Diagnosis not present

## 2021-08-24 DIAGNOSIS — L299 Pruritus, unspecified: Secondary | ICD-10-CM | POA: Diagnosis not present

## 2021-08-27 ENCOUNTER — Other Ambulatory Visit: Payer: Self-pay | Admitting: Internal Medicine

## 2021-09-23 ENCOUNTER — Non-Acute Institutional Stay: Payer: Medicare Other | Admitting: Nurse Practitioner

## 2021-09-23 ENCOUNTER — Encounter: Payer: Self-pay | Admitting: Nurse Practitioner

## 2021-09-23 ENCOUNTER — Other Ambulatory Visit: Payer: Self-pay

## 2021-09-23 DIAGNOSIS — M810 Age-related osteoporosis without current pathological fracture: Secondary | ICD-10-CM

## 2021-09-23 DIAGNOSIS — N3942 Incontinence without sensory awareness: Secondary | ICD-10-CM | POA: Diagnosis not present

## 2021-09-23 DIAGNOSIS — E785 Hyperlipidemia, unspecified: Secondary | ICD-10-CM

## 2021-09-23 DIAGNOSIS — E1169 Type 2 diabetes mellitus with other specified complication: Secondary | ICD-10-CM

## 2021-09-23 DIAGNOSIS — F418 Other specified anxiety disorders: Secondary | ICD-10-CM | POA: Diagnosis not present

## 2021-09-23 DIAGNOSIS — E1121 Type 2 diabetes mellitus with diabetic nephropathy: Secondary | ICD-10-CM

## 2021-09-23 DIAGNOSIS — I10 Essential (primary) hypertension: Secondary | ICD-10-CM | POA: Diagnosis not present

## 2021-09-23 DIAGNOSIS — M5431 Sciatica, right side: Secondary | ICD-10-CM | POA: Diagnosis not present

## 2021-09-23 NOTE — Assessment & Plan Note (Signed)
takes Alendronate. Ca, Vit D. DEXA 12/24/20 t score -2.6 ?

## 2021-09-23 NOTE — Assessment & Plan Note (Signed)
Lower back pain, right sciatica, comes and goes for over a year.  

## 2021-09-23 NOTE — Assessment & Plan Note (Signed)
Her mood has no change, prn Trazodone 

## 2021-09-23 NOTE — Assessment & Plan Note (Addendum)
off Metformin, last Hgb a1c 6.5 11/19/20. The patient due for foot exam ?

## 2021-09-23 NOTE — Progress Notes (Signed)
? ?Location:   clinic [facility] ?  ?Place of Service:    ?Provider: Marlana Latus NP ? ?Code Status: DNR ?Goals of Care: [place] ?[place] Directives 06/10/2021  ?Does Patient Have a Medical Advance Directive? Yes  ?Type of Paramedic of [place];Living will  ?Does patient want to make changes to medical advance directive? Yes (ED - send information to [place])  ?Copy of [place] in Chart? No - copy requested  ?Would patient like information on creating a medical advance directive? -  ?Pre-existing out of facility DNR order (yellow form or pink MOST form) -  ? ? ? ?Chief Complaint  ?Patient presents with  ? Medical Management of Chronic Issues  ?  Patient presents today for a 3 month follow-up.  ? Quality Metric Gaps  ?  A1C, eye&foot exam, zoster, COVID booster  ? ? ?HPI: Patient is a 88 y.o. female seen today for medical management of chronic diseases.   ?  HTN, blood pressure is controlled on Lisinopril, Atorvastatin ?            Her mood has no change, prn Trazodone ?            T2DM, off Metformin, last Hgb a1c 6.5 11/19/20, due for foot exam ?            Urinary frequency, resumed it, stopped taking Oxybutynin due to dry eyes, mouth, blurred vision.  ?            Lower back pain, right sciatica, comes and goes for over a year.  ?            OP takes Alendronate. Ca, Vit D. DEXA 12/24/20 t score -2.6 ?            Hyperlipidemia, takes Atorvastatin, fish oil, LDL 103 11/18/20 ? ? ?Past Medical History:  ?Diagnosis Date  ? Allergy   ? Arthritis   ? Diabetes mellitus   ? Hernia, abdominal   ? Hyperlipidemia   ? Hypertension   ? Osteopenia, senile 09/26/2012  ? T score - 1.6 Left femur  ? ? ?Past Surgical History:  ?Procedure Laterality Date  ? APPENDECTOMY    ? BACK SURGERY    ? 1965  ? EYE SURGERY    ? GUM SURGERY    ? KNEE SURGERY    ? ? ?No Known Allergies ? ?Allergies as of 09/23/2021   ?No Known Allergies ?  ? ?  ?Medication List  ?  ? ?  ? Accurate as of September 23, 2021 11:59 PM. If  you have any questions, ask your nurse or doctor.  ?  ?  ? ?  ? ?alendronate 70 MG tablet ?Commonly known as: Fosamax ?Take 1 tablet (70 mg total) by mouth every 7 (seven) days. Take with a full glass of water on an empty stomach. ?  ?atorvastatin 10 MG tablet ?Commonly known as: LIPITOR ?TAKE ONE TABLET BY MOUTH ONCE DAILY ?  ?Biotene Moisturizing Mouth Soln ?Use as directed 5 sprays in the mouth or throat 3 (three) times daily as needed. ?  ?Calcium Carbonate-Vitamin D3 600-400 MG-UNIT Tabs ?Take 1 tablet by mouth 2 (two) times daily. ?  ?fish oil-omega-3 fatty acids 1000 MG capsule ?Take 2 g by mouth daily. ?  ?Flax Oil-Fish Oil-Borage Oil Caps ?Take by mouth daily. ?  ?guaiFENesin 600 MG 12 hr tablet ?Commonly known as: Mucinex ?Take 1 tablet (600 mg total) by mouth 2 (two) times daily. ?  ?  lisinopril 10 MG tablet ?Commonly known as: ZESTRIL ?TAKE ONE TABLET BY MOUTH DAILY ?  ?magnesium oxide 400 MG tablet ?Commonly known as: MAG-OX ?Take 400 mg by mouth daily. ?  ?MUCINEX DM PO ?Take 1 tablet by mouth as needed. ?  ?PRESERVISION AREDS 2+MULTI VIT PO ?Take 1 tablet by mouth 2 (two) times daily. ?  ?ROBITUSSIN 12 HOUR COUGH PO ?Take 1 tablet by mouth every 6 (six) hours as needed. ?  ?traZODone 50 MG tablet ?Commonly known as: DESYREL ?Take 0.5 tablets (25 mg total) by mouth at bedtime as needed for sleep. ?  ?Vitamin B-12 5000 MCG Subl ?Place under the tongue daily. ?  ?vitamin C 500 MG tablet ?Commonly known as: ASCORBIC ACID ?Take 500 mg by mouth daily. ?  ?Vitamin D3 50 MCG (2000 UT) Tabs ?Take 1 tablet by mouth daily. ?  ?vitamin E 45 MG (100 UNITS) capsule ?Take by mouth daily. ?  ? ?  ? ? ?Review of Systems:  ?Review of Systems  ?Constitutional:  Negative for appetite change, fatigue and fever.  ?HENT:  Positive for hearing loss. Negative for congestion, postnasal drip, rhinorrhea and sore throat.   ?Eyes:  Negative for visual disturbance.  ?Respiratory:  Positive for cough. Negative for chest tightness,  shortness of breath and wheezing.   ?     Dark phlegm production.   ?Cardiovascular:  Negative for chest pain, palpitations and leg swelling.  ?Gastrointestinal:  Negative for abdominal pain, constipation, nausea and vomiting.  ?Genitourinary:  Positive for frequency. Negative for dysuria, hematuria and urgency.  ?     Average 6-7x/night.   ?Musculoskeletal:  Positive for arthralgias, back pain and gait problem.  ?     Right sciatica. Walks with walker and cane.   ?Skin:  Negative for color change.  ?Neurological:  Negative for speech difficulty, weakness and headaches.  ?Psychiatric/Behavioral:  Positive for sleep disturbance. Negative for behavioral problems. The patient is not nervous/anxious.   ?     Memory lapses.   ? ?Health Maintenance  ?Topic Date Due  ? Zoster Vaccines- Shingrix (1 of 2) Never done  ? FOOT EXAM  11/07/2020  ? HEMOGLOBIN A1C  05/21/2021  ? COVID-19 Vaccine (4 - Booster for Moderna series) 10/09/2021 (Originally 07/14/2020)  ? TETANUS/TDAP  12/15/2021  ? OPHTHALMOLOGY EXAM  05/25/2022  ? Pneumonia Vaccine 63+ Years old  Completed  ? INFLUENZA VACCINE  Completed  ? DEXA SCAN  Completed  ? HPV VACCINES  Aged Out  ? ? ?Physical Exam: ?Vitals:  ? 09/23/21 1453  ?BP: 120/70  ?Pulse: 73  ?Temp: 97.8 ?F (36.6 ?C)  ?SpO2: 97%  ?Weight: 158 lb 6.4 oz (71.8 kg)  ?Height: '5\' 3"'$  (1.6 m)  ? ?Body mass index is 28.06 kg/m?Marland Kitchen ?Physical Exam ?Vitals and nursing note reviewed.  ?Constitutional:   ?   Comments: Exhausted appearance.   ?HENT:  ?   Head: Normocephalic and atraumatic.  ?   Nose: No congestion or rhinorrhea.  ?   Mouth/Throat:  ?   Mouth: Mucous membranes are dry.  ?Eyes:  ?   Extraocular Movements: Extraocular movements intact.  ?   Conjunctiva/sclera: Conjunctivae normal.  ?   Pupils: Pupils are equal, round, and reactive to light.  ?Cardiovascular:  ?   Rate and Rhythm: Normal rate and regular rhythm.  ?   Heart sounds: No murmur heard. ?Pulmonary:  ?   Effort: Pulmonary effort is normal.  ?    Breath sounds: Rales present. No wheezing  or rhonchi.  ?   Comments: Central congestion. Posterior lung base rales.  ?Abdominal:  ?   General: Bowel sounds are normal.  ?   Palpations: Abdomen is soft.  ?   Tenderness: There is no abdominal tenderness.  ?Musculoskeletal:  ?   Cervical back: Normal range of motion and neck supple.  ?   Right lower leg: No edema.  ?   Left lower leg: No edema.  ?Skin: ?   General: Skin is warm and dry.  ?   Comments: Lateral left lower leg rash, treated by dermatology.   ?Neurological:  ?   General: No focal deficit present.  ?   Mental Status: She is alert. Mental status is at baseline.  ?   Gait: Gait abnormal.  ?   Comments: Oriented to person, place.   ?Psychiatric:     ?   Mood and Affect: Mood normal.     ?   Behavior: Behavior normal.     ?   Thought Content: Thought content normal.  ? ? ?Labs reviewed: ?Basic Metabolic Panel: ?Recent Labs  ?  11/18/20 ?0927 11/19/20 ?1818  ?NA 135 136  ?K 4.3 4.1  ?CL 99 102  ?CO2 23 24  ?GLUCOSE 115* 125*  ?BUN 20 23  ?CREATININE 0.89* 0.96  ?CALCIUM 9.3 9.2  ? ?Liver Function Tests: ?Recent Labs  ?  11/18/20 ?0927 11/19/20 ?1818  ?AST 19 20  ?ALT 9 13  ?ALKPHOS  --  44  ?BILITOT 0.6 0.5  ?PROT 7.2 7.2  ?ALBUMIN  --  3.9  ? ?No results for input(s): LIPASE, AMYLASE in the last 8760 hours. ?No results for input(s): AMMONIA in the last 8760 hours. ?CBC: ?Recent Labs  ?  11/18/20 ?0927 11/19/20 ?1818  ?WBC 9.6 8.6  ?NEUTROABS 5,107 4.0  ?HGB 14.5 13.9  ?HCT 43.3 41.6  ?MCV 91.2 92.9  ?PLT 252 252  ? ?Lipid Panel: ?Recent Labs  ?  11/18/20 ?1696  ?CHOL 174  ?HDL 50  ?LDLCALC 103*  ?TRIG 116  ?CHOLHDL 3.5  ? ?Lab Results  ?Component Value Date  ? HGBA1C 6.5 (H) 11/18/2020  ? ? ?Procedures since last visit: ?No results found. ? ?Assessment/Plan ? ?Hypertension ?blood pressure is controlled on Lisinopril, Atorvastatin ? ?Depression with anxiety ?Her mood has no change, prn Trazodone ? ?Diabetes mellitus type 2, controlled ([place]) ?off Metformin, last  Hgb a1c 6.5 11/19/20. The patient due for foot exam ? ?Incontinent of urine ?Resumed Oxybutynin,  stopped taking Oxybutynin due to dry eyes, mouth, blurred vision.  ? ?Back pain with right-sided sciatica

## 2021-09-23 NOTE — Assessment & Plan Note (Signed)
takes Atorvastatin, fish oil, LDL 103 11/18/20 

## 2021-09-23 NOTE — Patient Instructions (Addendum)
F/u in clinic 3 months after labs. See her 2 weeks for AWV.  ?

## 2021-09-23 NOTE — Assessment & Plan Note (Addendum)
Resumed Oxybutynin,  stopped taking Oxybutynin due to dry eyes, mouth, blurred vision.  ?

## 2021-09-23 NOTE — Assessment & Plan Note (Signed)
blood pressure is controlled on Lisinopril, Atorvastatin 

## 2021-09-24 ENCOUNTER — Encounter: Payer: Self-pay | Admitting: Nurse Practitioner

## 2021-10-07 ENCOUNTER — Non-Acute Institutional Stay: Payer: Medicare Other | Admitting: Nurse Practitioner

## 2021-10-07 ENCOUNTER — Encounter: Payer: Self-pay | Admitting: Nurse Practitioner

## 2021-10-07 VITALS — BP 122/74 | HR 80 | Temp 97.8°F | Ht 63.0 in | Wt 160.4 lb

## 2021-10-07 DIAGNOSIS — Z0000 Encounter for general adult medical examination without abnormal findings: Secondary | ICD-10-CM | POA: Diagnosis not present

## 2021-10-07 NOTE — Progress Notes (Signed)
? ?Subjective:  ? Catherine Ellis is a 88 y.o. female who presents for Medicare Annual (Subsequent) preventive examination in the Clinic [facility] ? ?   ?Objective:  ?  ?Today's Vitals  ? 10/07/21 1510 10/07/21 1517  ?BP: 122/74   ?Pulse: 80   ?Temp: 97.8 ?F (36.6 ?C)   ?SpO2: 98%   ?Weight: 160 lb 6.4 oz (72.8 kg)   ?Height: '5\' 3"'$  (1.6 m)   ?PainSc:  3   ? ?Body mass index is 28.41 kg/m?. ? ? ?  06/10/2021  ?  3:30 PM 12/03/2020  ?  1:22 PM 11/19/2020  ?  6:14 PM 11/19/2020  ?  3:46 PM 08/06/2020  ?  2:42 PM 11/08/2018  ?  1:07 PM 06/09/2017  ?  8:50 AM  ?Advanced Directives  ?Does Patient Have a Medical Advance Directive? Yes No Yes No Yes Yes Yes  ?Type of Paramedic of [place];Living will  Out of facility DNR (pink MOST or yellow form)  Healthcare Power of Attorney Living will [place];Living will;Out of facility DNR (pink MOST or yellow form)  ?Does patient want to make changes to medical advance directive? Yes (ED - send information to MyChart)    No - Patient declined No - Patient declined No - Patient declined  ?Copy of [place] in Chart? No - copy requested    No - copy requested  Yes  ?Would patient like information on creating a medical advance directive?  No - Patient declined  No - Patient declined     ?Pre-existing out of facility DNR order (yellow form or pink MOST form)       Yellow form placed in chart (order not valid for inpatient use);Pink MOST form placed in chart (order not valid for inpatient use)  ? ? ?Current Medications (verified) ?Outpatient Encounter Medications as of 10/07/2021  ?Medication Sig  ? alendronate (FOSAMAX) 70 MG tablet Take 1 tablet (70 mg total) by mouth every 7 (seven) days. Take with a full glass of water on an empty stomach.  ? Artificial Saliva (BIOTENE MOISTURIZING MOUTH) SOLN Use as directed 5 sprays in the mouth or throat 3 (three) times daily as needed.  ? atorvastatin (LIPITOR) 10 MG tablet TAKE ONE TABLET BY  MOUTH ONCE DAILY  ? Calcium Carbonate-Vitamin D3 600-400 MG-UNIT TABS Take 1 tablet by mouth 2 (two) times daily.  ? Cholecalciferol (VITAMIN D3) 50 MCG (2000 UT) TABS Take 1 tablet by mouth daily.  ? Cyanocobalamin (VITAMIN B-12) 5000 MCG SUBL Place under the tongue daily.  ? Dextromethorphan Polistirex (ROBITUSSIN 12 HOUR COUGH PO) Take 1 tablet by mouth every 6 (six) hours as needed.  ? Dextromethorphan-guaiFENesin (MUCINEX DM PO) Take 1 tablet by mouth as needed.  ? fish oil-omega-3 fatty acids 1000 MG capsule Take 2 g by mouth daily.  ? Flax Oil-Fish Oil-Borage Oil CAPS Take by mouth daily.  ? guaiFENesin (MUCINEX) 600 MG 12 hr tablet Take 1 tablet (600 mg total) by mouth 2 (two) times daily.  ? lisinopril (ZESTRIL) 10 MG tablet TAKE ONE TABLET BY MOUTH DAILY  ? magnesium oxide (MAG-OX) 400 MG tablet Take 400 mg by mouth daily.  ? Multiple Vitamins-Minerals (PRESERVISION AREDS 2+MULTI VIT PO) Take 1 tablet by mouth 2 (two) times daily.  ? traZODone (DESYREL) 50 MG tablet Take 0.5 tablets (25 mg total) by mouth at bedtime as needed for sleep.  ? vitamin C (ASCORBIC ACID) 500 MG tablet Take 500 mg  by mouth daily.  ? vitamin E 100 UNIT capsule Take by mouth daily.  ? ?Facility-Administered Encounter Medications as of 10/07/2021  ?Medication  ? 0.9 %  sodium chloride infusion  ? ? ?Allergies (verified) ?Patient has no known allergies.  ? ?History: ?Past Medical History:  ?Diagnosis Date  ? Allergy   ? Arthritis   ? Diabetes mellitus   ? Hernia, abdominal   ? Hyperlipidemia   ? Hypertension   ? Osteopenia, senile 09/26/2012  ? T score - 1.6 Left femur  ? ?Past Surgical History:  ?Procedure Laterality Date  ? APPENDECTOMY    ? BACK SURGERY    ? 1965  ? EYE SURGERY    ? GUM SURGERY    ? KNEE SURGERY    ? ?Family History  ?Problem Relation Age of Onset  ? Stroke Mother   ? Heart disease Father   ? ?Social History  ? ?Socioeconomic History  ? Marital status: Single  ?  Spouse name: Not on file  ? Number of children: Not  on file  ? Years of education: Not on file  ? Highest education level: Not on file  ?Occupational History  ? Not on file  ?Tobacco Use  ? Smoking status: Never  ? Smokeless tobacco: Never  ?Vaping Use  ? Vaping Use: Never used  ?Substance and Sexual Activity  ? Alcohol use: No  ? Drug use: No  ? Sexual activity: Not on file  ?Other Topics Concern  ? Not on file  ?Social History Narrative  ? Tobacco use, amount per day now: None  ?   ? Past tobacco use, amount per day: None  ?   ? How many years did you use tobacco: None  ?   ? Alcohol use (drinks per week): None  ?   ? Diet: Regular  ?   ? Do you drink/eat things with caffeine? Sweet tea  ?   ? Marital status: Single            What year were you married?  ?   ? Do you live in a house, apartment, assisted living, condo, trailer? Apartment  ?   ? Is it one or more stories? 1  ?   ? How many persons live in your home? 1  ?   ? Do you have any pets in your home? No  ?   ? 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   ? Do you exercise? Yes             How often? Water exercise 5 days a week.    ?   ? Do you have a living will? Yes  ?   ? Do you have a DNR form? No            If not, do you want to discuss one? Yes  ?   ? Do you have signed POA/HPOA forms?  Yes           ? ?Social Determinants of Health  ? ?Financial Resource Strain: Not on file  ?Food Insecurity: Not on file  ?Transportation Needs: Not on file  ?Physical Activity: Not on file  ?Stress: Not on file  ?Social Connections: Not on file  ? ? ?Tobacco Counseling ?Counseling given: Not Answered ? ? ?Clinical Intake: ? ?Pre-visit preparation completed: Yes ? ?Pain : 0-10 ?Pain Score: 3  ?Pain Type: Chronic pain ?Pain Location: Back ?Pain Orientation: Lower ?Pain Descriptors / Indicators: Discomfort,  Throbbing, Spasm, Grimacing ?Pain Onset: More than a month ago ?Pain Frequency: Several days a week ?Pain Relieving Factors: relaxation, goes away as day goes by ?Effect of Pain on Daily Activities: slow getting up  in morning ? ?Pain Relieving Factors: relaxation, goes away as day goes by ? ?BMI - recorded: 28.41 ?Nutritional Status: BMI of 19-24  Normal ?Nutritional Risks: None ?Diabetes: No ? ?How often do you need to have someone help you when you read instructions, pamphlets, or other written materials from your doctor or pharmacy?: 1 - Never ?What is the last grade level you completed in school?: 2 years of college ? ?Diabetic? yes ? ?Interpreter Needed?: No ? ?Information entered by :: Shamirah Ivan Bretta Bang NP ? ? ?Activities of Daily Living ? ?  10/07/2021  ?  3:17 PM  ?In your present state of health, do you have any difficulty performing the following activities:  ?Hearing? 0  ?Vision? 0  ?Difficulty concentrating or making decisions? 0  ?Walking or climbing stairs? 1  ?Dressing or bathing? 0  ?Doing errands, shopping? 0  ?Preparing Food and eating ? N  ?Using the Toilet? N  ?In the past six months, have you accidently leaked urine? N  ?Do you have problems with loss of bowel control? N  ?Managing your Medications? N  ?Managing your Finances? N  ?Housekeeping or managing your Housekeeping? N  ? ? ?Patient Care Team: ?Virgie Dad, MD as PCP - General (Internal Medicine) ?Matricia Begnaud X, NP as Nurse Practitioner (Internal Medicine) ? ?Indicate any recent Medical Services you may have received from other than [facility] providers in the past year (date may be approximate). ? ?   ?Assessment:  ? This is a routine wellness examination for Catherine Ellis. ? ?Hearing/Vision screen ?Hearing Screening - Comments:: No concerns ?Vision Screening - Comments:: No concerns  ? ?Dietary issues and exercise activities discussed: ?Current Exercise Habits: Home exercise routine;Structured exercise class, Type of exercise: exercise ball;strength training/weights;stretching;walking, Time (Minutes): 30, Frequency (Times/Week): 4, Weekly Exercise (Minutes/Week): 120 ? ? Goals Addressed   ? ?  ?  ?  ?  ? This Visit's Progress  ?  Maintain Mobility and Function      ?  Evidence-based guidance:  ?Emphasize the importance of physical activity and aerobic exercise as included in treatment plan; assess barriers to adherence; consider patient's abilities and preferences.

## 2021-10-08 ENCOUNTER — Encounter: Payer: Self-pay | Admitting: Nurse Practitioner

## 2021-10-12 ENCOUNTER — Other Ambulatory Visit: Payer: Self-pay | Admitting: Internal Medicine

## 2021-10-12 NOTE — Telephone Encounter (Signed)
Patient has request refill on medication "Oxybutynin". Medication not on patient medication list. Medication pend and sent to Mast Man, NP. ?

## 2021-11-17 ENCOUNTER — Other Ambulatory Visit: Payer: Self-pay

## 2021-12-09 ENCOUNTER — Other Ambulatory Visit: Payer: Self-pay | Admitting: Nurse Practitioner

## 2021-12-09 ENCOUNTER — Other Ambulatory Visit: Payer: Medicare Other

## 2021-12-09 DIAGNOSIS — E1121 Type 2 diabetes mellitus with diabetic nephropathy: Secondary | ICD-10-CM | POA: Diagnosis not present

## 2021-12-09 DIAGNOSIS — E1169 Type 2 diabetes mellitus with other specified complication: Secondary | ICD-10-CM

## 2021-12-09 DIAGNOSIS — N3942 Incontinence without sensory awareness: Secondary | ICD-10-CM

## 2021-12-09 DIAGNOSIS — E785 Hyperlipidemia, unspecified: Secondary | ICD-10-CM | POA: Diagnosis not present

## 2021-12-10 LAB — LIPID PANEL
Cholesterol: 207 mg/dL — ABNORMAL HIGH (ref <200)
HDL: 58 mg/dL (ref >=50)
LDL Cholesterol (Calc): 112 mg/dL (calc) — ABNORMAL HIGH
Non-HDL Cholesterol (Calc): 149 mg/dL (calc) — ABNORMAL HIGH (ref <130)
Total CHOL/HDL Ratio: 3.6 (calc) (ref <5.0)
Triglycerides: 247 mg/dL — ABNORMAL HIGH (ref <150)

## 2021-12-10 LAB — COMPLETE METABOLIC PANEL WITH GFR
AG Ratio: 1.6 (calc) (ref 1.0–2.5)
ALT: 13 U/L (ref 6–29)
AST: 16 U/L (ref 10–35)
Albumin: 4.2 g/dL (ref 3.6–5.1)
Alkaline phosphatase (APISO): 61 U/L (ref 37–153)
BUN: 23 mg/dL (ref 7–25)
CO2: 22 mmol/L (ref 20–32)
Calcium: 9.2 mg/dL (ref 8.6–10.4)
Chloride: 104 mmol/L (ref 98–110)
Creat: 0.69 mg/dL (ref 0.60–0.95)
Globulin: 2.6 g/dL (calc) (ref 1.9–3.7)
Glucose, Bld: 124 mg/dL — ABNORMAL HIGH (ref 65–99)
Potassium: 4.6 mmol/L (ref 3.5–5.3)
Sodium: 138 mmol/L (ref 135–146)
Total Bilirubin: 0.5 mg/dL (ref 0.2–1.2)
Total Protein: 6.8 g/dL (ref 6.1–8.1)
eGFR: 78 mL/min/{1.73_m2} (ref >=60)

## 2021-12-10 LAB — CBC WITH DIFFERENTIAL/PLATELET
Absolute Monocytes: 911 cells/uL (ref 200–950)
Basophils Absolute: 59 cells/uL (ref 0–200)
Basophils Relative: 0.6 %
Eosinophils Absolute: 216 cells/uL (ref 15–500)
Eosinophils Relative: 2.2 %
HCT: 39.7 % (ref 35.0–45.0)
Hemoglobin: 13.7 g/dL (ref 11.7–15.5)
Lymphs Abs: 4753 cells/uL — ABNORMAL HIGH (ref 850–3900)
MCH: 31.9 pg (ref 27.0–33.0)
MCHC: 34.5 g/dL (ref 32.0–36.0)
MCV: 92.3 fL (ref 80.0–100.0)
MPV: 11.6 fL (ref 7.5–12.5)
Monocytes Relative: 9.3 %
Neutro Abs: 3861 cells/uL (ref 1500–7800)
Neutrophils Relative %: 39.4 %
Platelets: 268 10*3/uL (ref 140–400)
RBC: 4.3 10*6/uL (ref 3.80–5.10)
RDW: 12.9 % (ref 11.0–15.0)
Total Lymphocyte: 48.5 %
WBC: 9.8 10*3/uL (ref 3.8–10.8)

## 2021-12-10 LAB — HEMOGLOBIN A1C
Hgb A1c MFr Bld: 6.6 % of total Hgb — ABNORMAL HIGH (ref <5.7)
Mean Plasma Glucose: 143 mg/dL
eAG (mmol/L): 7.9 mmol/L

## 2021-12-10 LAB — TSH: TSH: 3.03 mIU/L (ref 0.40–4.50)

## 2021-12-15 ENCOUNTER — Encounter: Payer: Self-pay | Admitting: Family Medicine

## 2021-12-15 ENCOUNTER — Non-Acute Institutional Stay: Payer: Medicare Other | Admitting: Family Medicine

## 2021-12-15 VITALS — BP 130/72 | HR 96 | Temp 96.8°F | Ht 63.0 in | Wt 161.0 lb

## 2021-12-15 DIAGNOSIS — I10 Essential (primary) hypertension: Secondary | ICD-10-CM

## 2021-12-15 DIAGNOSIS — E1169 Type 2 diabetes mellitus with other specified complication: Secondary | ICD-10-CM

## 2021-12-15 DIAGNOSIS — G3184 Mild cognitive impairment, so stated: Secondary | ICD-10-CM | POA: Diagnosis not present

## 2021-12-15 DIAGNOSIS — E785 Hyperlipidemia, unspecified: Secondary | ICD-10-CM

## 2021-12-15 NOTE — Progress Notes (Signed)
Provider:  Alain Honey, MD  Careteam: Patient Care Team: Virgie Dad, MD as PCP - General (Internal Medicine) Mast, Man X, NP as Nurse Practitioner (Internal Medicine)  PLACE OF SERVICE:  [place] Directive information    No Known Allergies  Chief Complaint  Patient presents with   Medical Management of Chronic Issues    Patient presents today for a 3 month follow-up.   Quality Metric Gaps    Foot exam, TDAP,COVID booster #4     HPI: Patient is a 88 y.o. female .  Patient is here to follow-up chronic problems including type 2 diabetes, hypertension, hyperlipidemia, and osteoporosis. I recently reviewed some lab work that she had done last week and all looks very good especially normal renal function.  Her A1c off any medicines is 6.6. We reviewed her medicines.  I have explained that I do not know many 88 year old who still need a statin especially in view of no cardiovascular events and 98 years of living so I suggested she could stop the statin.  Also she would like to stop the sleeping pill as she feels that it is not effective.  She will hold it for 2 weeks to see if she observes any difference.  Also discussed alendronate she takes once a week for her bone health.  She has not had bone density for many years. She is concerned about her hearing and wonders if hearing testing would be appropriate but during our office visit she had no problems asking me to repeat myself because she could not hear.  Review of Systems:  Review of Systems  Constitutional: Negative.   HENT:  Positive for hearing loss.   Respiratory: Negative.    Cardiovascular: Negative.   Gastrointestinal: Negative.   Musculoskeletal: Negative.   Neurological: Negative.   Endo/Heme/Allergies: Negative.   All other systems reviewed and are negative.  Past Medical History:  Diagnosis Date   Allergy    Arthritis    Diabetes mellitus    Hernia, abdominal    Hyperlipidemia     Hypertension    Osteopenia, senile 09/26/2012   T score - 1.6 Left femur   Past Surgical History:  Procedure Laterality Date   APPENDECTOMY     BACK SURGERY     1965   EYE SURGERY     GUM SURGERY     KNEE SURGERY     Social History:   reports that she has never smoked. She has never used smokeless tobacco. She reports that she does not drink alcohol and does not use drugs.  Family History  Problem Relation Age of Onset   Stroke Mother    Heart disease Father     Medications: Patient's Medications  New Prescriptions   No medications on file  Previous Medications   ALENDRONATE (FOSAMAX) 70 MG TABLET    Take 1 tablet (70 mg total) by mouth every 7 (seven) days. Take with a full glass of water on an empty stomach.   ARTIFICIAL SALIVA (BIOTENE MOISTURIZING MOUTH) SOLN    Use as directed 5 sprays in the mouth or throat 3 (three) times daily as needed.   ATORVASTATIN (LIPITOR) 10 MG TABLET    TAKE ONE TABLET BY MOUTH ONCE DAILY   CALCIUM CARBONATE-VITAMIN D3 600-400 MG-UNIT TABS    Take 1 tablet by mouth 2 (two) times daily.   CHOLECALCIFEROL (VITAMIN D3) 50 MCG (2000 UT) TABS    Take 1 tablet by mouth daily.  CYANOCOBALAMIN (VITAMIN B-12) 5000 MCG SUBL    Place under the tongue daily.   FISH OIL-OMEGA-3 FATTY ACIDS 1000 MG CAPSULE    Take 2 g by mouth daily.   FLAX OIL-FISH OIL-BORAGE OIL CAPS    Take by mouth daily.   GUAIFENESIN (MUCINEX) 600 MG 12 HR TABLET    Take 1 tablet (600 mg total) by mouth 2 (two) times daily.   LISINOPRIL (ZESTRIL) 10 MG TABLET    TAKE ONE TABLET BY MOUTH DAILY   MAGNESIUM OXIDE (MAG-OX) 400 MG TABLET    Take 400 mg by mouth daily.   MULTIPLE VITAMINS-MINERALS (PRESERVISION AREDS 2+MULTI VIT PO)    Take 1 tablet by mouth 2 (two) times daily.   OXYBUTYNIN (DITROPAN-XL) 10 MG 24 HR TABLET    TAKE ONE TABLET AT BEDTIME.   TRAZODONE (DESYREL) 50 MG TABLET    Take 0.5 tablets (25 mg total) by mouth at bedtime as needed for sleep.   VITAMIN C (ASCORBIC ACID)  500 MG TABLET    Take 500 mg by mouth daily.   VITAMIN E 100 UNIT CAPSULE    Take by mouth daily.  Modified Medications   No medications on file  Discontinued Medications   DEXTROMETHORPHAN POLISTIREX (ROBITUSSIN 12 HOUR COUGH PO)    Take 1 tablet by mouth every 6 (six) hours as needed.   DEXTROMETHORPHAN-GUAIFENESIN (MUCINEX DM PO)    Take 1 tablet by mouth as needed.    Physical Exam:  Vitals:   12/15/21 1503  BP: 130/72  Pulse: 96  Temp: (!) 96.8 F (36 C)  SpO2: 97%  Weight: 161 lb (73 kg)  Height: '5\' 3"'$  (1.6 m)   Body mass index is 28.52 kg/m. Wt Readings from Last 3 Encounters:  12/15/21 161 lb (73 kg)  10/07/21 160 lb 6.4 oz (72.8 kg)  09/23/21 158 lb 6.4 oz (71.8 kg)    Physical Exam Vitals and nursing note reviewed.  Constitutional:      Appearance: Normal appearance.  Cardiovascular:     Rate and Rhythm: Normal rate and regular rhythm.  Pulmonary:     Effort: Pulmonary effort is normal.     Breath sounds: Normal breath sounds.  Musculoskeletal:     Comments: Uses walker for ambulation  Neurological:     General: No focal deficit present.     Mental Status: She is alert and oriented to person, place, and time.    Labs reviewed: Basic Metabolic Panel: Recent Labs    12/09/21 0800  NA 138  K 4.6  CL 104  CO2 22  GLUCOSE 124*  BUN 23  CREATININE 0.69  CALCIUM 9.2  TSH 3.03   Liver Function Tests: Recent Labs    12/09/21 0800  AST 16  ALT 13  BILITOT 0.5  PROT 6.8   No results for input(s): LIPASE, AMYLASE in the last 8760 hours. No results for input(s): AMMONIA in the last 8760 hours. CBC: Recent Labs    12/09/21 0800  WBC 9.8  NEUTROABS 3,861  HGB 13.7  HCT 39.7  MCV 92.3  PLT 268   Lipid Panel: Recent Labs    12/09/21 0800  CHOL 207*  HDL 58  LDLCALC 112*  TRIG 247*  CHOLHDL 3.6   TSH: Recent Labs    12/09/21 0800  TSH 3.03   A1C: Lab Results  Component Value Date   HGBA1C 6.6 (H) 12/09/2021      Assessment/Plan  1. Mild cognitive impairment During the course of  our encounter today I find no cognitive loss.  She appears very bright vibrant for her age of 4  2. Primary hypertension She was good at 130/72 on lisinopril 10 mg   3. Hyperlipidemia associated with type 2 diabetes mellitus ([place]) As above I recommended she stop statin and she is agreeable   Alain Honey, MD [place] [phone]

## 2021-12-16 ENCOUNTER — Encounter: Payer: Medicare Other | Admitting: Nurse Practitioner

## 2022-01-12 ENCOUNTER — Non-Acute Institutional Stay: Payer: Medicare Other | Admitting: Family Medicine

## 2022-01-12 ENCOUNTER — Encounter: Payer: Self-pay | Admitting: Family Medicine

## 2022-01-12 DIAGNOSIS — L720 Epidermal cyst: Secondary | ICD-10-CM | POA: Diagnosis not present

## 2022-01-12 NOTE — Progress Notes (Signed)
Provider:  Alain Honey, MD  Careteam: Patient Care Team: Virgie Dad, MD as PCP - General (Internal Medicine) Mast, Man X, NP as Nurse Practitioner (Internal Medicine)  PLACE OF SERVICE:  [place] Directive information    No Known Allergies  No chief complaint on file.    HPI: Patient is a 88 y.o. female .  Patient has a lump on her left upper gluteal area.  There is no pain redness drainage fever.  She is not sure how long the lump has been present.  She does have concerns that it may be some sort of malignancy.  Review of Systems:  Review of Systems  Skin:        Lump left upper posterior hip  All other systems reviewed and are negative.   Past Medical History:  Diagnosis Date   Allergy    Arthritis    Diabetes mellitus    Hernia, abdominal    Hyperlipidemia    Hypertension    Osteopenia, senile 09/26/2012   T score - 1.6 Left femur   Past Surgical History:  Procedure Laterality Date   APPENDECTOMY     BACK SURGERY     1965   EYE SURGERY     GUM SURGERY     KNEE SURGERY     Social History:   reports that she has never smoked. She has never used smokeless tobacco. She reports that she does not drink alcohol and does not use drugs.  Family History  Problem Relation Age of Onset   Stroke Mother    Heart disease Father     Medications: Patient's Medications  New Prescriptions   No medications on file  Previous Medications   ARTIFICIAL SALIVA (BIOTENE MOISTURIZING MOUTH) SOLN    Use as directed 5 sprays in the mouth or throat 3 (three) times daily as needed.   ATORVASTATIN (LIPITOR) 10 MG TABLET    TAKE ONE TABLET BY MOUTH ONCE DAILY   CALCIUM CARBONATE-VITAMIN D3 600-400 MG-UNIT TABS    Take 1 tablet by mouth 2 (two) times daily.   CHOLECALCIFEROL (VITAMIN D3) 50 MCG (2000 UT) TABS    Take 1 tablet by mouth daily.   CYANOCOBALAMIN (VITAMIN B-12) 5000 MCG SUBL    Place under the tongue daily.   FISH OIL-OMEGA-3 FATTY ACIDS 1000  MG CAPSULE    Take 2 g by mouth daily.   FLAX OIL-FISH OIL-BORAGE OIL CAPS    Take by mouth daily.   GUAIFENESIN (MUCINEX) 600 MG 12 HR TABLET    Take 1 tablet (600 mg total) by mouth 2 (two) times daily.   LISINOPRIL (ZESTRIL) 10 MG TABLET    TAKE ONE TABLET BY MOUTH DAILY   MAGNESIUM OXIDE (MAG-OX) 400 MG TABLET    Take 400 mg by mouth daily.   MULTIPLE VITAMINS-MINERALS (PRESERVISION AREDS 2+MULTI VIT PO)    Take 1 tablet by mouth 2 (two) times daily.   OXYBUTYNIN (DITROPAN-XL) 10 MG 24 HR TABLET    TAKE ONE TABLET AT BEDTIME.   TRAZODONE (DESYREL) 50 MG TABLET    Take 0.5 tablets (25 mg total) by mouth at bedtime as needed for sleep.   VITAMIN C (ASCORBIC ACID) 500 MG TABLET    Take 500 mg by mouth daily.   VITAMIN E 100 UNIT CAPSULE    Take by mouth daily.  Modified Medications   No medications on file  Discontinued Medications   No medications on file  Physical Exam:  There were no vitals filed for this visit. There is no height or weight on file to calculate BMI. Wt Readings from Last 3 Encounters:  12/15/21 161 lb (73 kg)  10/07/21 160 lb 6.4 oz (72.8 kg)  09/23/21 158 lb 6.4 oz (71.8 kg)    Physical Exam Vitals and nursing note reviewed.  Constitutional:      Appearance: Normal appearance.  Skin:    Comments: Lump in question on left upper hip posterior measures 2.5 x 2.5 cm.  It is very mobile.  It is firm.  It does not transilluminate.  It is nontender. To me this feels like a structure within the skin.  The fact that it is not fixed and very mobile or tender makes me think it is not a malignancy but I would like her to return in 1 month at which time we will remeasure.  If it has increased in size will refer her to surgeon for removal  Neurological:     Mental Status: She is alert.     Labs reviewed: Basic Metabolic Panel: Recent Labs    12/09/21 0800  NA 138  K 4.6  CL 104  CO2 22  GLUCOSE 124*  BUN 23  CREATININE 0.69  CALCIUM 9.2  TSH 3.03   Liver  Function Tests: Recent Labs    12/09/21 0800  AST 16  ALT 13  BILITOT 0.5  PROT 6.8   No results for input(s): "LIPASE", "AMYLASE" in the last 8760 hours. No results for input(s): "AMMONIA" in the last 8760 hours. CBC: Recent Labs    12/09/21 0800  WBC 9.8  NEUTROABS 3,861  HGB 13.7  HCT 39.7  MCV 92.3  PLT 268   Lipid Panel: Recent Labs    12/09/21 0800  CHOL 207*  HDL 58  LDLCALC 112*  TRIG 247*  CHOLHDL 3.6   TSH: Recent Labs    12/09/21 0800  TSH 3.03   A1C: Lab Results  Component Value Date   HGBA1C 6.6 (H) 12/09/2021     Assessment/Plan  1. Epidermal cyst As noted above I think this is a cyst within the dermis or epidermis of the skin.  It is not characteristic of a malignancy.  At age [age] patient is not anxious to have it removed but I think if we recheck in 1 month and there is increase in size that removal would be appropriate   Alain Honey, MD [place] [phone]

## 2022-01-24 ENCOUNTER — Other Ambulatory Visit: Payer: Self-pay | Admitting: Internal Medicine

## 2022-02-23 ENCOUNTER — Encounter: Payer: Self-pay | Admitting: Family Medicine

## 2022-02-23 ENCOUNTER — Non-Acute Institutional Stay: Payer: Medicare Other | Admitting: Family Medicine

## 2022-02-23 VITALS — BP 124/56 | HR 73 | Temp 92.3°F | Ht 63.0 in | Wt 158.0 lb

## 2022-02-23 DIAGNOSIS — L720 Epidermal cyst: Secondary | ICD-10-CM | POA: Diagnosis not present

## 2022-02-23 NOTE — Patient Instructions (Signed)
Wash skin over cyst with Dial soap every day

## 2022-02-23 NOTE — Progress Notes (Signed)
Provider:  Alain Honey, MD  Careteam: Catherine Ellis Care Team: Virgie Dad, MD as PCP - General (Internal Medicine) Mast, Man X, NP as Nurse Practitioner (Internal Medicine)  PLACE OF SERVICE:  [place] Directive information    No Known Allergies  Chief Complaint  Catherine Ellis presents with   Medical Management of Chronic Issues    Catherine Ellis presents today for a 2 month follow-up.   Quality Metric Gaps    Foot exam, TDAP,zoster, COVID#4     HPI: Catherine Ellis is a 88 y.o. female . Pt worried about cyst on L gluteal area. Thinks it moved? No pain or drainage.  Review of Systems:  Review of Systems  Skin:        Lump l hip  All other systems reviewed and are negative.   Past Medical History:  Diagnosis Date   Allergy    Arthritis    Diabetes mellitus    Hernia, abdominal    Hyperlipidemia    Hypertension    Osteopenia, senile 09/26/2012   T score - 1.6 Left femur   Past Surgical History:  Procedure Laterality Date   APPENDECTOMY     BACK SURGERY     1965   EYE SURGERY     GUM SURGERY     KNEE SURGERY     Social History:   reports that she has never smoked. She has never used smokeless tobacco. She reports that she does not drink alcohol and does not use drugs.  Family History  Problem Relation Age of Onset   Stroke Mother    Heart disease Father     Medications: Catherine Ellis's Medications  New Prescriptions   No medications on file  Previous Medications   ARTIFICIAL SALIVA (BIOTENE MOISTURIZING MOUTH) SOLN    Use as directed 5 sprays in the mouth or throat 3 (three) times daily as needed.   ATORVASTATIN (LIPITOR) 10 MG TABLET    TAKE ONE TABLET BY MOUTH ONCE DAILY   CALCIUM CARBONATE-VITAMIN D3 600-400 MG-UNIT TABS    Take 1 tablet by mouth 2 (two) times daily.   CHOLECALCIFEROL (VITAMIN D3) 50 MCG (2000 UT) TABS    Take 1 tablet by mouth daily.   CYANOCOBALAMIN (VITAMIN B-12) 5000 MCG SUBL    Place under the tongue daily.   FISH OIL-OMEGA-3  FATTY ACIDS 1000 MG CAPSULE    Take 2 g by mouth daily.   FLAX OIL-FISH OIL-BORAGE OIL CAPS    Take by mouth daily.   GUAIFENESIN (MUCINEX) 600 MG 12 HR TABLET    Take 1 tablet (600 mg total) by mouth 2 (two) times daily.   LISINOPRIL (ZESTRIL) 10 MG TABLET    TAKE ONE TABLET BY MOUTH DAILY   MAGNESIUM OXIDE (MAG-OX) 400 MG TABLET    Take 400 mg by mouth daily.   MULTIPLE VITAMINS-MINERALS (PRESERVISION AREDS 2+MULTI VIT PO)    Take 1 tablet by mouth 2 (two) times daily.   OXYBUTYNIN (DITROPAN-XL) 10 MG 24 HR TABLET    TAKE ONE TABLET AT BEDTIME.   TRAZODONE (DESYREL) 50 MG TABLET    Take 0.5 tablets (25 mg total) by mouth at bedtime as needed for sleep.   VITAMIN C (ASCORBIC ACID) 500 MG TABLET    Take 500 mg by mouth daily.   VITAMIN E 100 UNIT CAPSULE    Take by mouth daily.  Modified Medications   No medications on file  Discontinued Medications   No medications on file  Physical Exam:  Vitals:   02/23/22 1404  BP: (!) 124/56  Pulse: 73  Temp: (!) 92.3 F (33.5 C)  SpO2: 99%  Weight: 158 lb (71.7 kg)  Height: '5\' 3"'$  (1.6 m)   Body mass index is 27.99 kg/m. Wt Readings from Last 3 Encounters:  02/23/22 158 lb (71.7 kg)  01/12/22 160 lb 3.2 oz (72.7 kg)  12/15/21 161 lb (73 kg)    Physical Exam Vitals and nursing note reviewed.  Constitutional:      Appearance: Normal appearance.  Skin:    Comments: Pt has cyst on L lower gluteal area; mobile and non-tender Has characteristics if epidermal cyst  Neurological:     Mental Status: She is alert.     Labs reviewed: Basic Metabolic Panel: Recent Labs    12/09/21 0800  NA 138  K 4.6  CL 104  CO2 22  GLUCOSE 124*  BUN 23  CREATININE 0.69  CALCIUM 9.2  TSH 3.03   Liver Function Tests: Recent Labs    12/09/21 0800  AST 16  ALT 13  BILITOT 0.5  PROT 6.8   No results for input(s): "LIPASE", "AMYLASE" in the last 8760 hours. No results for input(s): "AMMONIA" in the last 8760 hours. CBC: Recent Labs     12/09/21 0800  WBC 9.8  NEUTROABS 3,861  HGB 13.7  HCT 39.7  MCV 92.3  PLT 268   Lipid Panel: Recent Labs    12/09/21 0800  CHOL 207*  HDL 58  LDLCALC 112*  TRIG 247*  CHOLHDL 3.6   TSH: Recent Labs    12/09/21 0800  TSH 3.03   A1C: Lab Results  Component Value Date   HGBA1C 6.6 (H) 12/09/2021     Assessment/Plan  1. Epidermal cyst Cyst is unchanged. Do not feel it has malignant potential Wash daily with Dialsoap   Alain Honey, MD [place] [phone]

## 2022-05-02 ENCOUNTER — Other Ambulatory Visit: Payer: Self-pay | Admitting: Nurse Practitioner

## 2022-05-02 DIAGNOSIS — Z23 Encounter for immunization: Secondary | ICD-10-CM | POA: Diagnosis not present

## 2022-06-01 ENCOUNTER — Encounter: Payer: Self-pay | Admitting: Family Medicine

## 2022-06-01 ENCOUNTER — Non-Acute Institutional Stay (INDEPENDENT_AMBULATORY_CARE_PROVIDER_SITE_OTHER): Payer: Medicare Other | Admitting: Family Medicine

## 2022-06-01 VITALS — BP 118/70 | HR 78 | Temp 97.6°F | Ht 63.0 in | Wt 161.4 lb

## 2022-06-01 DIAGNOSIS — L720 Epidermal cyst: Secondary | ICD-10-CM | POA: Diagnosis not present

## 2022-06-01 DIAGNOSIS — E1121 Type 2 diabetes mellitus with diabetic nephropathy: Secondary | ICD-10-CM | POA: Diagnosis not present

## 2022-06-01 DIAGNOSIS — E1169 Type 2 diabetes mellitus with other specified complication: Secondary | ICD-10-CM

## 2022-06-01 DIAGNOSIS — H353132 Nonexudative age-related macular degeneration, bilateral, intermediate dry stage: Secondary | ICD-10-CM | POA: Diagnosis not present

## 2022-06-01 DIAGNOSIS — E785 Hyperlipidemia, unspecified: Secondary | ICD-10-CM | POA: Diagnosis not present

## 2022-06-01 DIAGNOSIS — H52203 Unspecified astigmatism, bilateral: Secondary | ICD-10-CM | POA: Diagnosis not present

## 2022-06-01 DIAGNOSIS — Z961 Presence of intraocular lens: Secondary | ICD-10-CM | POA: Diagnosis not present

## 2022-06-01 NOTE — Progress Notes (Signed)
Provider:  Alain Honey, MD  Careteam: Patient Care Team: Virgie Dad, MD as PCP - General (Internal Medicine) Mast, Man X, NP as Nurse Practitioner (Internal Medicine)  PLACE OF SERVICE:  [facility] Directive information    No Known Allergies  Chief Complaint  Patient presents with   Medical Management of Chronic Issues    Patient presents today for a 3 month follow-up     HPI: Patient is a 88 y.o. female .  Patient is here for 90-monthfollow-up to follow a cyst that is done on her left upper and lateral hip.  She thinks it may have increased in size and location since last visit. Generally she is doing well she stays very active and engaged for her 89 years. There is no pain or other symptoms associated with the cyst  Review of Systems:  Review of Systems  Constitutional: Negative.   Respiratory: Negative.    Cardiovascular: Negative.   Musculoskeletal: Negative.   Neurological: Negative.   Psychiatric/Behavioral: Negative.    All other systems reviewed and are negative.   Past Medical History:  Diagnosis Date   Allergy    Arthritis    Diabetes mellitus    Hernia, abdominal    Hyperlipidemia    Hypertension    Osteopenia, senile 09/26/2012   T score - 1.6 Left femur   Past Surgical History:  Procedure Laterality Date   APPENDECTOMY     BACK SURGERY     1965   EYE SURGERY     GUM SURGERY     KNEE SURGERY     Social History:   reports that she has never smoked. She has never used smokeless tobacco. She reports that she does not drink alcohol and does not use drugs.  Family History  Problem Relation Age of Onset   Stroke Mother    Heart disease Father     Medications: Patient's Medications  New Prescriptions   No medications on file  Previous Medications   ARTIFICIAL SALIVA (BIOTENE MOISTURIZING MOUTH) SOLN    Use as directed 5 sprays in the mouth or throat 3 (three) times daily as needed.   ATORVASTATIN (LIPITOR) 10 MG  TABLET    TAKE ONE TABLET BY MOUTH ONCE DAILY   CALCIUM CARBONATE-VITAMIN D3 600-400 MG-UNIT TABS    Take 1 tablet by mouth 2 (two) times daily.   CHOLECALCIFEROL (VITAMIN D3) 50 MCG (2000 UT) TABS    Take 1 tablet by mouth daily.   CYANOCOBALAMIN (VITAMIN B-12) 5000 MCG SUBL    Place under the tongue daily.   FISH OIL-OMEGA-3 FATTY ACIDS 1000 MG CAPSULE    Take 2 g by mouth daily.   FLAX OIL-FISH OIL-BORAGE OIL CAPS    Take by mouth daily.   GUAIFENESIN (MUCINEX) 600 MG 12 HR TABLET    Take 1 tablet (600 mg total) by mouth 2 (two) times daily.   LISINOPRIL (ZESTRIL) 10 MG TABLET    TAKE ONE TABLET BY MOUTH DAILY   MAGNESIUM OXIDE (MAG-OX) 400 MG TABLET    Take 400 mg by mouth daily.   MULTIPLE VITAMINS-MINERALS (PRESERVISION AREDS 2+MULTI VIT PO)    Take 1 tablet by mouth 2 (two) times daily.   OXYBUTYNIN (DITROPAN-XL) 10 MG 24 HR TABLET    TAKE ONE TABLET AT BEDTIME.   TRAZODONE (DESYREL) 50 MG TABLET    Take 0.5 tablets (25 mg total) by mouth at bedtime as needed for sleep.   VITAMIN C (  ASCORBIC ACID) 500 MG TABLET    Take 500 mg by mouth daily.   VITAMIN E 100 UNIT CAPSULE    Take by mouth daily.  Modified Medications   No medications on file  Discontinued Medications   No medications on file    Physical Exam:  Vitals:   06/01/22 1326  BP: 118/70  Pulse: 78  Temp: 97.6 F (36.4 C)  SpO2: 96%  Weight: 161 lb 6.4 oz (73.2 kg)  Height: '5\' 3"'$  (1.6 m)   Body mass index is 28.59 kg/m. Wt Readings from Last 3 Encounters:  06/01/22 161 lb 6.4 oz (73.2 kg)  02/23/22 158 lb (71.7 kg)  01/12/22 160 lb 3.2 oz (72.7 kg)    Physical Exam Vitals and nursing note reviewed.  Constitutional:      Appearance: Normal appearance.  Cardiovascular:     Rate and Rhythm: Normal rate and regular rhythm.  Pulmonary:     Effort: Pulmonary effort is normal.     Breath sounds: Normal breath sounds.  Skin:    Comments: Cyst is still present in same location to my exam.  Measured it again today  and then longest diameter measures 4.5 cm.  It is mobile and soft.  Today I transilluminated the cyst and light does indeed pass from 1 side to the other consistent with cyst  Neurological:     Mental Status: She is alert.     Labs reviewed: Basic Metabolic Panel: Recent Labs    12/09/21 0800  NA 138  K 4.6  CL 104  CO2 22  GLUCOSE 124*  BUN 23  CREATININE 0.69  CALCIUM 9.2  TSH 3.03   Liver Function Tests: Recent Labs    12/09/21 0800  AST 16  ALT 13  BILITOT 0.5  PROT 6.8   No results for input(s): "LIPASE", "AMYLASE" in the last 8760 hours. No results for input(s): "AMMONIA" in the last 8760 hours. CBC: Recent Labs    12/09/21 0800  WBC 9.8  NEUTROABS 3,861  HGB 13.7  HCT 39.7  MCV 92.3  PLT 268   Lipid Panel: Recent Labs    12/09/21 0800  CHOL 207*  HDL 58  LDLCALC 112*  TRIG 247*  CHOLHDL 3.6   TSH: Recent Labs    12/09/21 0800  TSH 3.03   A1C: Lab Results  Component Value Date   HGBA1C 6.6 (H) 12/09/2021     Assessment/Plan  1. Epidermal cyst Cyst has not changed.  Hoped to reassure her again today that there is no change in features including mobility and transillumination reassured me that it is not anything other than cyst  2. Hyperlipidemia associated with type 2 diabetes mellitus ([place]) Patient continues on 10 mg of Lipitor.  LDL is slightly elevated at 112 but would not be inclined to increase dose at her age of 69 and an otherwise good health without other cardiovascular risk factors  3. Controlled type 2 diabetes mellitus with diabetic nephropathy, without long-term current use of insulin ([place]) He is on no meds.  She watches what she eats but she does like fruit.  Her last A1c 5 months ago was 6.6    Alain Honey, MD [place] [phone]

## 2022-07-14 ENCOUNTER — Non-Acute Institutional Stay: Payer: Medicare Other | Admitting: Nurse Practitioner

## 2022-07-14 ENCOUNTER — Encounter: Payer: Self-pay | Admitting: Nurse Practitioner

## 2022-07-14 VITALS — BP 122/70 | HR 86 | Temp 97.8°F | Ht 63.0 in | Wt 158.0 lb

## 2022-07-14 DIAGNOSIS — F418 Other specified anxiety disorders: Secondary | ICD-10-CM | POA: Diagnosis not present

## 2022-07-14 DIAGNOSIS — R5381 Other malaise: Secondary | ICD-10-CM | POA: Diagnosis not present

## 2022-07-14 DIAGNOSIS — N3942 Incontinence without sensory awareness: Secondary | ICD-10-CM

## 2022-07-14 DIAGNOSIS — I10 Essential (primary) hypertension: Secondary | ICD-10-CM | POA: Diagnosis not present

## 2022-07-14 DIAGNOSIS — E1121 Type 2 diabetes mellitus with diabetic nephropathy: Secondary | ICD-10-CM

## 2022-07-14 DIAGNOSIS — R5383 Other fatigue: Secondary | ICD-10-CM | POA: Diagnosis not present

## 2022-07-14 DIAGNOSIS — E785 Hyperlipidemia, unspecified: Secondary | ICD-10-CM

## 2022-07-14 DIAGNOSIS — E1169 Type 2 diabetes mellitus with other specified complication: Secondary | ICD-10-CM

## 2022-07-14 DIAGNOSIS — M5431 Sciatica, right side: Secondary | ICD-10-CM

## 2022-07-14 DIAGNOSIS — M810 Age-related osteoporosis without current pathological fracture: Secondary | ICD-10-CM | POA: Diagnosis not present

## 2022-07-14 DIAGNOSIS — J209 Acute bronchitis, unspecified: Secondary | ICD-10-CM

## 2022-07-14 DIAGNOSIS — K219 Gastro-esophageal reflux disease without esophagitis: Secondary | ICD-10-CM | POA: Diagnosis not present

## 2022-07-14 MED ORDER — GUAIFENESIN ER 600 MG PO TB12: 600.0000 mg | ORAL_TABLET | Freq: Two times a day (BID) | ORAL | 0 refills | Status: DC

## 2022-07-14 MED ORDER — OMEPRAZOLE 20 MG PO CPDR: 20.0000 mg | DELAYED_RELEASE_CAPSULE | Freq: Every day | ORAL | 3 refills | Status: DC

## 2022-07-14 MED ORDER — AMOXICILLIN-POT CLAVULANATE 875-125 MG PO TABS: 1.0000 | ORAL_TABLET | Freq: Two times a day (BID) | ORAL | 0 refills | Status: DC

## 2022-07-14 NOTE — Assessment & Plan Note (Addendum)
off Metformin, last Hgb a1c 6.5 11/19/20, update Hgb a1c

## 2022-07-14 NOTE — Assessment & Plan Note (Signed)
OP takes Alendronate. Ca, Vit D. DEXA 12/24/20 t score -2.6

## 2022-07-14 NOTE — Progress Notes (Signed)
Location:   [facility]   Place of Service:  Clinic (12) Provider: Marlana Latus NP  Code Status: DNR Goals of Care:     06/10/2021    3:30 PM  Advanced Directives  Does Patient Have a Medical Advance Directive? Yes  Type of Paramedic of [place];Living will  Does patient want to make changes to medical advance directive? Yes (ED - send information to MyChart)  Copy of [place] in Chart? No - copy requested     Chief Complaint  Patient presents with   Acute Visit    Weakness and overall not feeling well x 1 week. Patient c/o cough, irregular bowels, and fatigue.  patient was covid tested x 2 and both times it was negative. Discuss ditropan, patient states she is not aware as to why she is not taking this medication.     HPI: Patient is a 89 y.o. female seen today for c/o feels weak, not feeling well, cough, yellow phlegm production for a week, denied chest pain, SOB, or palpitation.   HTN, blood pressure is controlled on Lisinopril, Atorvastatin             Her mood has no change, prn Trazodone             T2DM, off Metformin, last Hgb a1c 6.5 11/19/20             Urinary frequency, taking Oxybutynin, c/o  dry eyes, mouth, blurred vision.              Lower back pain, right sciatica, comes and goes for over a year.              OP takes Alendronate. Ca, Vit D. DEXA 12/24/20 t score -2.6             Hyperlipidemia, takes Atorvastatin, fish oil, LDL 103 11/18/20  Past Medical History:  Diagnosis Date   Allergy    Arthritis    Diabetes mellitus    Hernia, abdominal    Hyperlipidemia    Hypertension    Osteopenia, senile 09/26/2012   T score - 1.6 Left femur    Past Surgical History:  Procedure Laterality Date   APPENDECTOMY     BACK SURGERY     1965   EYE SURGERY     GUM SURGERY     KNEE SURGERY      No Known Allergies  Allergies as of 07/14/2022   No Known Allergies      Medication List        Accurate as of  July 14, 2022  4:52 PM. If you have any questions, ask your nurse or doctor.          amoxicillin-clavulanate 875-125 MG tablet Commonly known as: AUGMENTIN Take 1 tablet by mouth 2 (two) times daily. Started by:  X , NP   ascorbic acid 500 MG tablet Commonly known as: VITAMIN C Take 500 mg by mouth daily.   atorvastatin 10 MG tablet Commonly known as: LIPITOR TAKE ONE TABLET BY MOUTH ONCE DAILY   Biotene Moisturizing Mouth Soln Use as directed 5 sprays in the mouth or throat 3 (three) times daily as needed.   Calcium Carbonate-Vitamin D3 600-400 MG-UNIT Tabs Take 1 tablet by mouth 2 (two) times daily.   fish oil-omega-3 fatty acids 1000 MG capsule Take 2 g by mouth daily.   Flax Oil-Fish Oil-Borage Oil Caps Take by mouth daily.   guaiFENesin 600 MG  12 hr tablet Commonly known as: Mucinex Take 1 tablet (600 mg total) by mouth 2 (two) times daily. What changed: Another medication with the same name was added. Make sure you understand how and when to take each. Changed by:  X , NP   guaiFENesin 600 MG 12 hr tablet Commonly known as: Mucinex Take 1 tablet (600 mg total) by mouth 2 (two) times daily. What changed: You were already taking a medication with the same name, and this prescription was added. Make sure you understand how and when to take each. Changed by:  X , NP   lisinopril 10 MG tablet Commonly known as: ZESTRIL TAKE ONE TABLET BY MOUTH DAILY   magnesium oxide 400 MG tablet Commonly known as: MAG-OX Take 400 mg by mouth daily.   omeprazole 20 MG capsule Commonly known as: PRILOSEC Take 1 capsule (20 mg total) by mouth daily. Started by:  X , NP   oxybutynin 10 MG 24 hr tablet Commonly known as: DITROPAN-XL TAKE ONE TABLET AT BEDTIME.   PRESERVISION AREDS 2+MULTI VIT PO Take 1 tablet by mouth 2 (two) times daily.   traZODone 50 MG tablet Commonly known as: DESYREL Take 0.5 tablets (25 mg total) by mouth at bedtime  as needed for sleep.   Vitamin B-12 5000 MCG Subl Place under the tongue daily.   Vitamin D3 50 MCG (2000 UT) Tabs Take 1 tablet by mouth daily.   vitamin E 45 MG (100 UNITS) capsule Take by mouth daily.        Review of Systems:  Review of Systems  Constitutional:  Positive for appetite change and fatigue. Negative for fever.  HENT:  Positive for hearing loss. Negative for congestion, postnasal drip, rhinorrhea and sore throat.   Eyes:  Negative for visual disturbance.  Respiratory:  Positive for cough. Negative for chest tightness, shortness of breath and wheezing.        Dark phlegm production.   Cardiovascular:  Negative for chest pain, palpitations and leg swelling.  Gastrointestinal:  Negative for abdominal pain, constipation, nausea and vomiting.       Acid reflux, belching at times, mucous collected in the back of mouth/throat.   Genitourinary:  Positive for frequency. Negative for dysuria, hematuria and urgency.       Average 6-7x/night.   Musculoskeletal:  Positive for arthralgias, back pain and gait problem.       Right sciatica. Walks with walker and cane.   Skin:  Negative for color change.  Neurological:  Negative for speech difficulty, weakness and headaches.  Psychiatric/Behavioral:  Positive for sleep disturbance. Negative for behavioral problems. The patient is not nervous/anxious.        Memory lapses.     Health Maintenance  Topic Date Due   FOOT EXAM  11/07/2020   DTaP/Tdap/Td (2 - Td or Tdap) 12/15/2021   INFLUENZA VACCINE  02/08/2022   COVID-19 Vaccine (4 - 2023-24 season) 03/11/2022   HEMOGLOBIN A1C  06/10/2022   OPHTHALMOLOGY EXAM  12/15/2022 (Originally 05/25/2022)   Zoster Vaccines- Shingrix (1 of 2) 12/15/2022 (Originally 10/04/1971)   Medicare Annual Wellness (AWV)  10/08/2022   Pneumonia Vaccine 63+ Years old  Completed   DEXA SCAN  Completed   HPV VACCINES  Aged Out    Physical Exam: Vitals:   07/14/22 0821  BP: 122/70  Pulse: 86   Temp: 97.8 F (36.6 C)  TempSrc: Temporal  SpO2: 98%  Weight: 158 lb (71.7 kg)  Height: 5' 3" (1.6 m)  Body mass index is 27.99 kg/m. Physical Exam Vitals and nursing note reviewed.  Constitutional:      Comments: Exhausted appearance.   HENT:     Head: Normocephalic and atraumatic.     Nose: No congestion or rhinorrhea.     Mouth/Throat:     Mouth: Mucous membranes are dry.  Eyes:     Extraocular Movements: Extraocular movements intact.     Conjunctiva/sclera: Conjunctivae normal.     Pupils: Pupils are equal, round, and reactive to light.  Cardiovascular:     Rate and Rhythm: Normal rate and regular rhythm.     Heart sounds: No murmur heard. Pulmonary:     Effort: Pulmonary effort is normal.     Breath sounds: Rhonchi and rales present. No wheezing.     Comments: Central congestion. Posterior lung base rales. Diffused rhonchi R+L lungs Abdominal:     General: Bowel sounds are normal.     Palpations: Abdomen is soft.     Tenderness: There is no abdominal tenderness.  Musculoskeletal:     Cervical back: Normal range of motion and neck supple.     Right lower leg: No edema.     Left lower leg: No edema.  Skin:    General: Skin is warm and dry.     Comments: Lateral left lower leg rash, treated by dermatology.   Neurological:     General: No focal deficit present.     Mental Status: She is alert. Mental status is at baseline.     Gait: Gait abnormal.     Comments: Oriented to person, place.   Psychiatric:        Mood and Affect: Mood normal.        Behavior: Behavior normal.        Thought Content: Thought content normal.     Labs reviewed: Basic Metabolic Panel: Recent Labs    12/09/21 0800  NA 138  K 4.6  CL 104  CO2 22  GLUCOSE 124*  BUN 23  CREATININE 0.69  CALCIUM 9.2  TSH 3.03   Liver Function Tests: Recent Labs    12/09/21 0800  AST 16  ALT 13  BILITOT 0.5  PROT 6.8   No results for input(s): "LIPASE", "AMYLASE" in the last 8760  hours. No results for input(s): "AMMONIA" in the last 8760 hours. CBC: Recent Labs    12/09/21 0800  WBC 9.8  NEUTROABS 3,861  HGB 13.7  HCT 39.7  MCV 92.3  PLT 268   Lipid Panel: Recent Labs    12/09/21 0800  CHOL 207*  HDL 58  LDLCALC 112*  TRIG 247*  CHOLHDL 3.6   Lab Results  Component Value Date   HGBA1C 6.6 (H) 12/09/2021    Procedures since last visit: No results found.  Assessment/Plan  Malaise and fatigue  c/o feels weak, not feeling well, obtain COVID test, CBC/diff, CMP/eGFR  Hypertension blood pressure is controlled on Lisinopril, Atorvastatin  Depression with anxiety Her mood has no change, prn Trazodone  Diabetes mellitus type 2, controlled (H[facility])  off Metformin, last Hgb a1c 6.5 11/19/20, update Hgb a1c  Incontinent of urine  taking Oxybutynin, c/o  dry eyes, mouth, blurred vision.   Back pain with right-sided sciatica Lower back pain, right sciatica, comes and goes for over a year.   Osteoporosis  OP takes Alendronate. Ca, Vit D. DEXA 12/24/20 t score -2.6  Hyperlipidemia associated with type 2 diabetes mellitus ([place])  takes Atorvastatin, fish oil, LDL 103 11/18/20  Acute bronchitis c/o feels weak, not feeling well, cough, yellow phlegm production for a week, denied chest pain, SOB, or palpitation.  Augmentin 868m q 12hrs x 7 days, may consider CXR if no better.   GERD (gastroesophageal reflux disease) Cough, mucous in throat in am, belching at times, will try Omeprazole 244mqd, ST to eval and treat.   Labs/tests ordered: Hgb A1c,  CBC/diff, CMP/eGFR 07/19/21  Next appt:  2 weeks.

## 2022-07-14 NOTE — Assessment & Plan Note (Signed)
c/o feels weak, not feeling well, obtain COVID test, CBC/diff, CMP/eGFR

## 2022-07-14 NOTE — Assessment & Plan Note (Signed)
c/o feels weak, not feeling well, cough, yellow phlegm production for a week, denied chest pain, SOB, or palpitation.  Augmentin '875mg'$  q 12hrs x 7 days, may consider CXR if no better.

## 2022-07-14 NOTE — Assessment & Plan Note (Signed)
Lower back pain, right sciatica, comes and goes for over a year.

## 2022-07-14 NOTE — Assessment & Plan Note (Signed)
Cough, mucous in throat in am, belching at times, will try Omeprazole '20mg'$  qd, ST to eval and treat.

## 2022-07-14 NOTE — Assessment & Plan Note (Signed)
blood pressure is controlled on Lisinopril, Atorvastatin

## 2022-07-14 NOTE — Assessment & Plan Note (Signed)
takes Atorvastatin, fish oil, LDL 103 11/18/20

## 2022-07-14 NOTE — Assessment & Plan Note (Signed)
taking Oxybutynin, c/o  dry eyes, mouth, blurred vision.

## 2022-07-14 NOTE — Assessment & Plan Note (Signed)
Her mood has no change, prn Trazodone

## 2022-07-15 DIAGNOSIS — E1121 Type 2 diabetes mellitus with diabetic nephropathy: Secondary | ICD-10-CM | POA: Diagnosis not present

## 2022-07-15 DIAGNOSIS — J209 Acute bronchitis, unspecified: Secondary | ICD-10-CM | POA: Diagnosis not present

## 2022-07-19 ENCOUNTER — Telehealth: Payer: Self-pay | Admitting: *Deleted

## 2022-07-19 ENCOUNTER — Other Ambulatory Visit: Payer: Medicare Other

## 2022-07-19 DIAGNOSIS — J209 Acute bronchitis, unspecified: Secondary | ICD-10-CM

## 2022-07-19 DIAGNOSIS — E1121 Type 2 diabetes mellitus with diabetic nephropathy: Secondary | ICD-10-CM

## 2022-07-19 NOTE — Telephone Encounter (Signed)
Catherine Ellis with [place] [facility] called and stated that she is out at [facility] drawing labs and could not get patient's Lab Requisitions to print.   Requesting [place] to fax the Requisitions to Fax: [phone]. Faxed to her attention.

## 2022-07-20 LAB — HEMOGLOBIN A1C
Hgb A1c MFr Bld: 7.2 % of total Hgb — ABNORMAL HIGH (ref <5.7)
Mean Plasma Glucose: 160 mg/dL
eAG (mmol/L): 8.9 mmol/L

## 2022-07-20 LAB — COMPLETE METABOLIC PANEL WITH GFR
AG Ratio: 1.6 (calc) (ref 1.0–2.5)
ALT: 11 U/L (ref 6–29)
AST: 16 U/L (ref 10–35)
Albumin: 4.1 g/dL (ref 3.6–5.1)
Alkaline phosphatase (APISO): 63 U/L (ref 37–153)
BUN: 18 mg/dL (ref 7–25)
CO2: 21 mmol/L (ref 20–32)
Calcium: 9.1 mg/dL (ref 8.6–10.4)
Chloride: 102 mmol/L (ref 98–110)
Creat: 0.87 mg/dL (ref 0.60–0.95)
Globulin: 2.6 g/dL (calc) (ref 1.9–3.7)
Glucose, Bld: 171 mg/dL — ABNORMAL HIGH (ref 65–99)
Potassium: 4.2 mmol/L (ref 3.5–5.3)
Sodium: 136 mmol/L (ref 135–146)
Total Bilirubin: 0.6 mg/dL (ref 0.2–1.2)
Total Protein: 6.7 g/dL (ref 6.1–8.1)
eGFR: 60 mL/min/{1.73_m2} (ref >=60)

## 2022-07-20 LAB — CBC WITH DIFFERENTIAL/PLATELET
Absolute Monocytes: 735 cells/uL (ref 200–950)
Basophils Absolute: 53 cells/uL (ref 0–200)
Basophils Relative: 0.7 %
Eosinophils Absolute: 180 cells/uL (ref 15–500)
Eosinophils Relative: 2.4 %
HCT: 39.1 % (ref 35.0–45.0)
Hemoglobin: 13.5 g/dL (ref 11.7–15.5)
Lymphs Abs: 2408 cells/uL (ref 850–3900)
MCH: 31.3 pg (ref 27.0–33.0)
MCHC: 34.5 g/dL (ref 32.0–36.0)
MCV: 90.5 fL (ref 80.0–100.0)
MPV: 11.6 fL (ref 7.5–12.5)
Monocytes Relative: 9.8 %
Neutro Abs: 4125 cells/uL (ref 1500–7800)
Neutrophils Relative %: 55 %
Platelets: 305 10*3/uL (ref 140–400)
RBC: 4.32 10*6/uL (ref 3.80–5.10)
RDW: 12 % (ref 11.0–15.0)
Total Lymphocyte: 32.1 %
WBC: 7.5 10*3/uL (ref 3.8–10.8)

## 2022-07-21 DIAGNOSIS — R1319 Other dysphagia: Secondary | ICD-10-CM | POA: Diagnosis not present

## 2022-07-28 ENCOUNTER — Non-Acute Institutional Stay: Payer: Medicare Other | Admitting: Nurse Practitioner

## 2022-07-28 ENCOUNTER — Encounter: Payer: Self-pay | Admitting: Nurse Practitioner

## 2022-07-28 ENCOUNTER — Other Ambulatory Visit: Payer: Self-pay | Admitting: Internal Medicine

## 2022-07-28 VITALS — BP 130/68 | HR 87 | Temp 97.8°F | Resp 20 | Ht 63.0 in | Wt 159.0 lb

## 2022-07-28 DIAGNOSIS — E1121 Type 2 diabetes mellitus with diabetic nephropathy: Secondary | ICD-10-CM | POA: Diagnosis not present

## 2022-07-28 DIAGNOSIS — R21 Rash and other nonspecific skin eruption: Secondary | ICD-10-CM

## 2022-07-28 DIAGNOSIS — J209 Acute bronchitis, unspecified: Secondary | ICD-10-CM | POA: Diagnosis not present

## 2022-07-28 DIAGNOSIS — I10 Essential (primary) hypertension: Secondary | ICD-10-CM | POA: Diagnosis not present

## 2022-07-28 DIAGNOSIS — F418 Other specified anxiety disorders: Secondary | ICD-10-CM | POA: Diagnosis not present

## 2022-07-28 DIAGNOSIS — E785 Hyperlipidemia, unspecified: Secondary | ICD-10-CM

## 2022-07-28 DIAGNOSIS — N3942 Incontinence without sensory awareness: Secondary | ICD-10-CM

## 2022-07-28 DIAGNOSIS — M810 Age-related osteoporosis without current pathological fracture: Secondary | ICD-10-CM

## 2022-07-28 DIAGNOSIS — M5431 Sciatica, right side: Secondary | ICD-10-CM

## 2022-07-28 DIAGNOSIS — E1169 Type 2 diabetes mellitus with other specified complication: Secondary | ICD-10-CM

## 2022-07-28 MED ORDER — TRIAMCINOLONE ACETONIDE 0.5 % EX OINT: 1.0000 | TOPICAL_OINTMENT | Freq: Two times a day (BID) | CUTANEOUS | 1 refills | Status: DC

## 2022-07-28 MED ORDER — PREDNISONE 20 MG PO TABS: 20.0000 mg | ORAL_TABLET | Freq: Every day | ORAL | 0 refills | Status: DC

## 2022-07-28 NOTE — Assessment & Plan Note (Signed)
Lower back pain, right sciatica, comes and goes for over a year.

## 2022-07-28 NOTE — Assessment & Plan Note (Signed)
Her mood has no change, prn Trazodone, TSH 3.03 12/09/21

## 2022-07-28 NOTE — Assessment & Plan Note (Signed)
OP takes Alendronate. Ca, Vit D. DEXA 12/24/20 t score -2.6

## 2022-07-28 NOTE — Assessment & Plan Note (Signed)
Urinary frequency, taking Oxybutynin, c/o  dry eyes, mouth, blurred vision.

## 2022-07-28 NOTE — Assessment & Plan Note (Signed)
Migratory, chronic in nature, seen dermatology in the past, presently rash right upper chest, right calf, will apply 0.5% Triamcinolone cream prn.

## 2022-07-28 NOTE — Assessment & Plan Note (Signed)
blood pressure is controlled on Lisinopril, Atorvastatin, Bun/creat 18/0.87 07/15/22

## 2022-07-28 NOTE — Assessment & Plan Note (Signed)
off Metformin, last Hgb a1c 6.5 11/19/20<<7.2 07/15/22

## 2022-07-28 NOTE — Assessment & Plan Note (Addendum)
Acute bronchitis 07/14/22 improved weak, cough, yellow phlegm, treated with 7 day course of Augmentin. Wbc 7.5, neutrophils 55% 07/15/22 Residual cough, will try Prednisone '20mg'$  qd x7 days

## 2022-07-28 NOTE — Assessment & Plan Note (Signed)
takes Atorvastatin, fish oil, LDL 103 11/18/20

## 2022-07-28 NOTE — Progress Notes (Signed)
Location:   Clinic [facility]   Place of Service:  Clinic (12) Provider: Marlana Latus NP  Code Status: DNR Goals of Care:     06/10/2021    3:30 PM  Advanced Directives  Does Patient Have a Medical Advance Directive? Yes  Type of Paramedic of [place];Living will  Does patient want to make changes to medical advance directive? Yes (ED - send information to MyChart)  Copy of [place] in Chart? No - copy requested     Chief Complaint  Patient presents with   Acute Visit    Patient is here for a 2 week F/U Still having residual cough and congestion    HPI: Patient is a 89 y.o. female seen today for medical management of chronic diseases.     Acute bronchitis 07/14/22 improved weak, cough, yellow phlegm, treated with 7 day course of Augmentin. Wbc 7.5, neutrophils 55% 07/15/22   HTN, blood pressure is controlled on Lisinopril, Atorvastatin, Bun/creat 18/0.87 07/15/22             Her mood has no change, prn Trazodone, TSH 3.03 12/09/21             T2DM, off Metformin, last Hgb a1c 6.5 11/19/20<<7.2 07/15/22             Urinary frequency, taking Oxybutynin, c/o  dry eyes, mouth, blurred vision.              OP takes Alendronate. Ca, Vit D. DEXA 12/24/20 t score -2.6             Hyperlipidemia, takes Atorvastatin, fish oil, LDL 103 11/18/20  Lower back pain, right sciatica, comes and goes for over a year.    Past Medical History:  Diagnosis Date   Allergy    Arthritis    Diabetes mellitus    Hernia, abdominal    Hyperlipidemia    Hypertension    Osteopenia, senile 09/26/2012   T score - 1.6 Left femur    Past Surgical History:  Procedure Laterality Date   APPENDECTOMY     BACK SURGERY     1965   EYE SURGERY     GUM SURGERY     KNEE SURGERY      No Known Allergies  Allergies as of 07/28/2022   No Known Allergies      Medication List        Accurate as of July 28, 2022  4:04 PM. If you have any questions, ask your nurse or  doctor.          amoxicillin-clavulanate 875-125 MG tablet Commonly known as: AUGMENTIN Take 1 tablet by mouth 2 (two) times daily.   ascorbic acid 500 MG tablet Commonly known as: VITAMIN C Take 500 mg by mouth daily.   atorvastatin 10 MG tablet Commonly known as: LIPITOR TAKE ONE TABLET BY MOUTH ONCE DAILY   Biotene Moisturizing Mouth Soln Use as directed 5 sprays in the mouth or throat 3 (three) times daily as needed.   Calcium Carbonate-Vitamin D3 600-400 MG-UNIT Tabs Take 1 tablet by mouth 2 (two) times daily.   fish oil-omega-3 fatty acids 1000 MG capsule Take 2 g by mouth daily.   Flax Oil-Fish Oil-Borage Oil Caps Take by mouth daily.   guaiFENesin 600 MG 12 hr tablet Commonly known as: Mucinex Take 1 tablet (600 mg total) by mouth 2 (two) times daily.   guaiFENesin 600 MG 12 hr tablet Commonly known as: Mucinex Take  1 tablet (600 mg total) by mouth 2 (two) times daily.   lisinopril 10 MG tablet Commonly known as: ZESTRIL TAKE ONE TABLET BY MOUTH DAILY   magnesium oxide 400 MG tablet Commonly known as: MAG-OX Take 400 mg by mouth daily.   omeprazole 20 MG capsule Commonly known as: PRILOSEC Take 1 capsule (20 mg total) by mouth daily.   oxybutynin 10 MG 24 hr tablet Commonly known as: DITROPAN-XL TAKE ONE TABLET AT BEDTIME.   predniSONE 20 MG tablet Commonly known as: DELTASONE Take 1 tablet (20 mg total) by mouth daily with breakfast. Started by: Mattisyn Cardona X Kennetha Pearman, NP   PRESERVISION AREDS 2+MULTI VIT PO Take 1 tablet by mouth 2 (two) times daily.   traZODone 50 MG tablet Commonly known as: DESYREL Take 0.5 tablets (25 mg total) by mouth at bedtime as needed for sleep.   triamcinolone ointment 0.5 % Commonly known as: KENALOG Apply 1 Application topically 2 (two) times daily. Started by: Giannie Soliday X Diamante Rubin, NP   Vitamin B-12 5000 MCG Subl Place under the tongue daily.   Vitamin D3 50 MCG (2000 UT) Tabs Take 1 tablet by mouth daily.   vitamin E 45  MG (100 UNITS) capsule Take by mouth daily.        Review of Systems:  Review of Systems  Constitutional:  Negative for appetite change, fatigue and fever.  HENT:  Positive for hearing loss. Negative for congestion and sore throat.   Eyes:  Negative for visual disturbance.  Respiratory:  Positive for cough. Negative for chest tightness, shortness of breath and wheezing.        Dark phlegm production.   Cardiovascular:  Negative for chest pain, palpitations and leg swelling.  Gastrointestinal:  Negative for abdominal pain and constipation.       Acid reflux, belching at times, mucous collected in the back of mouth/throat.   Genitourinary:  Positive for frequency. Negative for dysuria, hematuria and urgency.       Average 6-7x/night.   Musculoskeletal:  Positive for arthralgias, back pain and gait problem.       Right sciatica. Walks with walker and cane.   Skin:  Positive for rash. Negative for color change.  Neurological:  Negative for speech difficulty, weakness and headaches.  Psychiatric/Behavioral:  Positive for sleep disturbance. Negative for behavioral problems. The patient is not nervous/anxious.        Memory lapses.     Health Maintenance  Topic Date Due   FOOT EXAM  11/07/2020   DTaP/Tdap/Td (2 - Td or Tdap) 12/15/2021   INFLUENZA VACCINE  02/08/2022   COVID-19 Vaccine (4 - 2023-24 season) 03/11/2022   OPHTHALMOLOGY EXAM  12/15/2022 (Originally 05/25/2022)   Zoster Vaccines- Shingrix (1 of 2) 12/15/2022 (Originally 10/04/1971)   Medicare Annual Wellness (AWV)  10/08/2022   HEMOGLOBIN A1C  01/13/2023   Pneumonia Vaccine 61+ Years old  Completed   DEXA SCAN  Completed   HPV VACCINES  Aged Out    Physical Exam: Vitals:   07/28/22 1439  BP: 130/68  Pulse: 87  Resp: 20  Temp: 97.8 F (36.6 C)  SpO2: 97%  Weight: 159 lb (72.1 kg)  Height: '5\' 3"'$  (1.6 m)   Body mass index is 28.17 kg/m. Physical Exam Vitals and nursing note reviewed.  Constitutional:       Appearance: Normal appearance.  HENT:     Head: Normocephalic and atraumatic.     Nose: Nose normal.     Mouth/Throat:  Mouth: Mucous membranes are moist.  Eyes:     Extraocular Movements: Extraocular movements intact.     Conjunctiva/sclera: Conjunctivae normal.     Pupils: Pupils are equal, round, and reactive to light.  Cardiovascular:     Rate and Rhythm: Normal rate and regular rhythm.     Heart sounds: No murmur heard. Pulmonary:     Effort: Pulmonary effort is normal.     Breath sounds: Rales present. No wheezing or rhonchi.     Comments: Posterior lung base rales.  Abdominal:     General: Bowel sounds are normal.     Palpations: Abdomen is soft.     Tenderness: There is no abdominal tenderness.  Musculoskeletal:     Cervical back: Normal range of motion and neck supple.     Right lower leg: No edema.     Left lower leg: No edema.  Skin:    General: Skin is warm and dry.     Findings: Rash present.     Comments: Lateral left lower leg rash, treated by dermatology in the past. Presently rash in the upper chest and right calf, papular, itching.   Neurological:     General: No focal deficit present.     Mental Status: She is alert. Mental status is at baseline.     Gait: Gait abnormal.     Comments: Oriented to person, place.   Psychiatric:        Mood and Affect: Mood normal.        Behavior: Behavior normal.        Thought Content: Thought content normal.     Labs reviewed: Basic Metabolic Panel: Recent Labs    12/09/21 0800 07/15/22 1038  NA 138 136  K 4.6 4.2  CL 104 102  CO2 22 21  GLUCOSE 124* 171*  BUN 23 18  CREATININE 0.69 0.87  CALCIUM 9.2 9.1  TSH 3.03  --    Liver Function Tests: Recent Labs    12/09/21 0800 07/15/22 1038  AST 16 16  ALT 13 11  BILITOT 0.5 0.6  PROT 6.8 6.7   No results for input(s): "LIPASE", "AMYLASE" in the last 8760 hours. No results for input(s): "AMMONIA" in the last 8760 hours. CBC: Recent Labs     12/09/21 0800 07/15/22 1038  WBC 9.8 7.5  NEUTROABS 3,861 4,125  HGB 13.7 13.5  HCT 39.7 39.1  MCV 92.3 90.5  PLT 268 305   Lipid Panel: Recent Labs    12/09/21 0800  CHOL 207*  HDL 58  LDLCALC 112*  TRIG 247*  CHOLHDL 3.6   Lab Results  Component Value Date   HGBA1C 7.2 (H) 07/15/2022    Procedures since last visit: No results found.  Assessment/Plan  Hypertension  blood pressure is controlled on Lisinopril, Atorvastatin, Bun/creat 18/0.87 07/15/22  Acute bronchitis Acute bronchitis 07/14/22 improved weak, cough, yellow phlegm, treated with 7 day course of Augmentin. Wbc 7.5, neutrophils 55% 07/15/22 Residual cough, will try Prednisone '20mg'$  qd x7 days   Depression with anxiety     Her mood has no change, prn Trazodone, TSH 3.03 12/09/21  Diabetes mellitus type 2, controlled ([place])  off Metformin, last Hgb a1c 6.5 11/19/20<<7.2 07/15/22  Incontinent of urine  Urinary frequency, taking Oxybutynin, c/o  dry eyes, mouth, blurred vision.   Osteoporosis OP takes Alendronate. Ca, Vit D. DEXA 12/24/20 t score -2.6  Back pain with right-sided sciatica Lower back pain, right sciatica, comes and goes for over a year.  Hyperlipidemia associated with type 2 diabetes mellitus ([place])  takes Atorvastatin, fish oil, LDL 103 11/18/20   Labs/tests ordered:  none  Next appt:  3 months.

## 2022-10-03 DIAGNOSIS — H353132 Nonexudative age-related macular degeneration, bilateral, intermediate dry stage: Secondary | ICD-10-CM | POA: Diagnosis not present

## 2022-10-20 ENCOUNTER — Other Ambulatory Visit: Payer: Self-pay | Admitting: Nurse Practitioner

## 2022-10-20 DIAGNOSIS — F418 Other specified anxiety disorders: Secondary | ICD-10-CM

## 2022-10-20 DIAGNOSIS — I10 Essential (primary) hypertension: Secondary | ICD-10-CM

## 2022-10-25 DIAGNOSIS — F418 Other specified anxiety disorders: Secondary | ICD-10-CM | POA: Diagnosis not present

## 2022-10-25 DIAGNOSIS — I10 Essential (primary) hypertension: Secondary | ICD-10-CM | POA: Diagnosis not present

## 2022-10-25 LAB — CBC WITH DIFFERENTIAL/PLATELET
Absolute Monocytes: 957 cells/uL — ABNORMAL HIGH (ref 200–950)
Basophils Absolute: 61 cells/uL (ref 0–200)
Basophils Relative: 0.7 %
Eosinophils Absolute: 235 cells/uL (ref 15–500)
Eosinophils Relative: 2.7 %
HCT: 38.3 % (ref 35.0–45.0)
Hemoglobin: 12.9 g/dL (ref 11.7–15.5)
Lymphs Abs: 4072 cells/uL — ABNORMAL HIGH (ref 850–3900)
MCH: 30.6 pg (ref 27.0–33.0)
MCHC: 33.7 g/dL (ref 32.0–36.0)
MCV: 90.8 fL (ref 80.0–100.0)
MPV: 11.5 fL (ref 7.5–12.5)
Monocytes Relative: 11 %
Neutro Abs: 3376 cells/uL (ref 1500–7800)
Neutrophils Relative %: 38.8 %
Platelets: 280 10*3/uL (ref 140–400)
RBC: 4.22 10*6/uL (ref 3.80–5.10)
RDW: 12.4 % (ref 11.0–15.0)
Total Lymphocyte: 46.8 %
WBC: 8.7 10*3/uL (ref 3.8–10.8)

## 2022-10-25 LAB — COMPREHENSIVE METABOLIC PANEL
AG Ratio: 1.6 (calc) (ref 1.0–2.5)
ALT: 14 U/L (ref 6–29)
AST: 18 U/L (ref 10–35)
Albumin: 4.1 g/dL (ref 3.6–5.1)
Alkaline phosphatase (APISO): 63 U/L (ref 37–153)
BUN: 18 mg/dL (ref 7–25)
CO2: 25 mmol/L (ref 20–32)
Calcium: 9.2 mg/dL (ref 8.6–10.4)
Chloride: 104 mmol/L (ref 98–110)
Creat: 0.73 mg/dL (ref 0.60–0.95)
Globulin: 2.5 g/dL (calc) (ref 1.9–3.7)
Glucose, Bld: 124 mg/dL — ABNORMAL HIGH (ref 65–99)
Potassium: 4.1 mmol/L (ref 3.5–5.3)
Sodium: 138 mmol/L (ref 135–146)
Total Bilirubin: 0.8 mg/dL (ref 0.2–1.2)
Total Protein: 6.6 g/dL (ref 6.1–8.1)

## 2022-10-25 LAB — TSH: TSH: 3.05 mIU/L (ref 0.40–4.50)

## 2022-10-27 ENCOUNTER — Encounter: Payer: Self-pay | Admitting: Nurse Practitioner

## 2022-10-27 ENCOUNTER — Non-Acute Institutional Stay: Payer: Medicare Other | Admitting: Nurse Practitioner

## 2022-10-27 ENCOUNTER — Encounter: Payer: Medicare Other | Admitting: Nurse Practitioner

## 2022-10-27 VITALS — BP 124/72 | HR 83 | Temp 97.5°F | Ht 63.0 in | Wt 159.4 lb

## 2022-10-27 DIAGNOSIS — I10 Essential (primary) hypertension: Secondary | ICD-10-CM | POA: Diagnosis not present

## 2022-10-27 DIAGNOSIS — E1121 Type 2 diabetes mellitus with diabetic nephropathy: Secondary | ICD-10-CM | POA: Diagnosis not present

## 2022-10-27 DIAGNOSIS — E785 Hyperlipidemia, unspecified: Secondary | ICD-10-CM

## 2022-10-27 DIAGNOSIS — M810 Age-related osteoporosis without current pathological fracture: Secondary | ICD-10-CM | POA: Diagnosis not present

## 2022-10-27 DIAGNOSIS — R252 Cramp and spasm: Secondary | ICD-10-CM | POA: Insufficient documentation

## 2022-10-27 DIAGNOSIS — N3942 Incontinence without sensory awareness: Secondary | ICD-10-CM | POA: Diagnosis not present

## 2022-10-27 DIAGNOSIS — F418 Other specified anxiety disorders: Secondary | ICD-10-CM

## 2022-10-27 DIAGNOSIS — M5431 Sciatica, right side: Secondary | ICD-10-CM | POA: Diagnosis not present

## 2022-10-27 DIAGNOSIS — E1169 Type 2 diabetes mellitus with other specified complication: Secondary | ICD-10-CM | POA: Diagnosis not present

## 2022-10-27 MED ORDER — METHOCARBAMOL 500 MG PO TABS: 250.0000 mg | ORAL_TABLET | Freq: Three times a day (TID) | ORAL | 1 refills | Status: DC | PRN

## 2022-10-27 NOTE — Assessment & Plan Note (Signed)
off Metformin, last Hgb a1c 6.5 11/19/20<<7.2 07/15/22 

## 2022-10-27 NOTE — Assessment & Plan Note (Addendum)
Continue Alendronate, Ca, Vit D 

## 2022-10-27 NOTE — Assessment & Plan Note (Signed)
blood pressure is controlled on Lisinopril, Atorvastatin, Bun/creat 18/0.73, Ca 9.2 11/04/22

## 2022-10-27 NOTE — Assessment & Plan Note (Signed)
Lower back pain, right sciatica, comes and goes for over a year.  ambulates with walker.

## 2022-10-27 NOTE — Assessment & Plan Note (Signed)
Her mood has no change, prn Trazodone, TSH 3.05 10/25/22

## 2022-10-27 NOTE — Progress Notes (Signed)
Location:   [facility]   Place of Service:  Clinic (12) Provider: Chipper Oman NP  Code Status: DNR Goals of Care:     06/10/2021    3:30 PM  Advanced Directives  Does Patient Have a Medical Advance Directive? Yes  Type of Estate agent of [place];Living will  Does patient want to make changes to medical advance directive? Yes (ED - send information to MyChart)  Copy of Healthcare Power of Attorney in Chart? No - copy requested     Chief Complaint  Patient presents with   Medical Management of Chronic Issues    Patient presents today for a 3 month follow-up   Quality Metric Gaps    Foot exam, AWV, TDAP, COVID#4    HPI: Patient is a 89 y.o. female seen today for medical management of chronic diseases.    Muscle cramps, Labs unremarkable, may consider Alprazolam or Methocarbamol   HTN, blood pressure is controlled on Lisinopril, Atorvastatin, Bun/creat 18/0.73, Ca 9.2 11/04/22             Her mood has no change, prn Trazodone, TSH 3.05 10/25/22             T2DM, off Metformin, last Hgb a1c 6.5 11/19/20<<7.2 07/15/22             Urinary frequency, taking Oxybutynin, c/o  dry eyes, mouth, blurred vision.              OP takes Alendronate. Ca, Vit D. DEXA 12/24/20 t score -2.6             Hyperlipidemia, takes Atorvastatin, fish oil, LDL 103 11/18/20             Lower back pain, right sciatica, comes and goes for over a year.     Past Medical History:  Diagnosis Date   Allergy    Arthritis    Diabetes mellitus    Hernia, abdominal    Hyperlipidemia    Hypertension    Osteopenia, senile 09/26/2012   T score - 1.6 Left femur    Past Surgical History:  Procedure Laterality Date   APPENDECTOMY     BACK SURGERY     1965   EYE SURGERY     GUM SURGERY     KNEE SURGERY      No Known Allergies  Allergies as of 10/27/2022   No Known Allergies      Medication List        Accurate as of October 27, 2022 11:59 PM. If you have any questions, ask your  nurse or doctor.          STOP taking these medications    amoxicillin-clavulanate 875-125 MG tablet Commonly known as: AUGMENTIN Stopped by: Darnita Woodrum X Sparkles Mcneely, NP   predniSONE 20 MG tablet Commonly known as: DELTASONE Stopped by: Leylani Duley X Kassey Laforest, NP       TAKE these medications    ascorbic acid 500 MG tablet Commonly known as: VITAMIN C Take 500 mg by mouth daily.   atorvastatin 10 MG tablet Commonly known as: LIPITOR TAKE ONE TABLET BY MOUTH ONCE DAILY   Biotene Moisturizing Mouth Soln Use as directed 5 sprays in the mouth or throat 3 (three) times daily as needed.   Calcium Carbonate-Vitamin D3 600-400 MG-UNIT Tabs Take 1 tablet by mouth 2 (two) times daily.   fish oil-omega-3 fatty acids 1000 MG capsule Take 2 g by mouth daily.   Flax Oil-Fish Oil-Borage Oil Caps  Take by mouth daily.   guaiFENesin 600 MG 12 hr tablet Commonly known as: Mucinex Take 1 tablet (600 mg total) by mouth 2 (two) times daily. What changed: Another medication with the same name was removed. Continue taking this medication, and follow the directions you see here. Changed by: Emmer Lillibridge X Marche Hottenstein, NP   lisinopril 10 MG tablet Commonly known as: ZESTRIL TAKE ONE TABLET BY MOUTH DAILY   magnesium oxide 400 MG tablet Commonly known as: MAG-OX Take 400 mg by mouth daily.   methocarbamol 500 MG tablet Commonly known as: ROBAXIN Take 0.5 tablets (250 mg total) by mouth every 8 (eight) hours as needed for muscle spasms. Started by: Reghan Thul X Staley Budzinski, NP   omeprazole 20 MG capsule Commonly known as: PRILOSEC Take 1 capsule (20 mg total) by mouth daily.   oxybutynin 10 MG 24 hr tablet Commonly known as: DITROPAN-XL TAKE ONE TABLET AT BEDTIME.   PRESERVISION AREDS 2+MULTI VIT PO Take 1 tablet by mouth 2 (two) times daily.   traZODone 50 MG tablet Commonly known as: DESYREL Take 0.5 tablets (25 mg total) by mouth at bedtime as needed for sleep.   triamcinolone ointment 0.5 % Commonly known as:  KENALOG Apply 1 Application topically 2 (two) times daily.   Vitamin B-12 5000 MCG Subl Place under the tongue daily.   Vitamin D3 50 MCG (2000 UT) Tabs Take 1 tablet by mouth daily.   vitamin E 45 MG (100 UNITS) capsule Take by mouth daily.        Review of Systems:  Review of Systems  Constitutional:  Negative for appetite change, fatigue and fever.  HENT:  Positive for hearing loss. Negative for congestion and sore throat.   Eyes:  Negative for visual disturbance.  Respiratory:  Negative for cough.   Cardiovascular:  Negative for leg swelling.  Gastrointestinal:  Negative for abdominal pain and constipation.       Acid reflux, belching at times, mucous collected in the back of mouth/throat.   Genitourinary:  Positive for frequency. Negative for dysuria, hematuria and urgency.       Average 6-7x/night.   Musculoskeletal:  Positive for arthralgias, back pain and gait problem.       Right sciatica. Walks with walker and cane. Leg cramps occasionally  Skin:  Negative for color change.  Neurological:  Negative for speech difficulty, weakness and headaches.  Psychiatric/Behavioral:  Positive for sleep disturbance. Negative for behavioral problems. The patient is not nervous/anxious.        Memory lapses.     Health Maintenance  Topic Date Due   FOOT EXAM  11/07/2020   DTaP/Tdap/Td (2 - Td or Tdap) 12/15/2021   COVID-19 Vaccine (4 - 2023-24 season) 03/11/2022   Medicare Annual Wellness (AWV)  10/08/2022   OPHTHALMOLOGY EXAM  12/15/2022 (Originally 05/25/2022)   Zoster Vaccines- Shingrix (1 of 2) 12/15/2022 (Originally 10/04/1973)   HEMOGLOBIN A1C  01/13/2023   INFLUENZA VACCINE  02/09/2023   Pneumonia Vaccine 55+ Years old  Completed   DEXA SCAN  Completed   HPV VACCINES  Aged Out    Physical Exam: Vitals:   10/27/22 1343  BP: 124/72  Pulse: 83  Temp: (!) 97.5 F (36.4 C)  SpO2: 98%  Weight: 159 lb 6.4 oz (72.3 kg)  Height:  (1.6 m)   Body mass index is  28.24 kg/m. Physical Exam Vitals and nursing note reviewed.  Constitutional:      Appearance: Normal appearance.  HENT:  Head: Normocephalic and atraumatic.     Nose: Nose normal.     Mouth/Throat:     Mouth: Mucous membranes are moist.  Eyes:     Extraocular Movements: Extraocular movements intact.     Conjunctiva/sclera: Conjunctivae normal.     Pupils: Pupils are equal, round, and reactive to light.  Cardiovascular:     Rate and Rhythm: Normal rate and regular rhythm.     Heart sounds: No murmur heard. Pulmonary:     Effort: Pulmonary effort is normal.     Breath sounds: Rales present. No rhonchi.     Comments: Bibasilar  Abdominal:     General: Bowel sounds are normal.     Palpations: Abdomen is soft.     Tenderness: There is no abdominal tenderness.  Musculoskeletal:     Cervical back: Normal range of motion and neck supple.     Right lower leg: No edema.     Left lower leg: No edema.  Skin:    General: Skin is warm and dry.  Neurological:     Mental Status: She is alert. Mental status is at baseline.     Gait: Gait abnormal.     Comments: Oriented to person, place.   Psychiatric:        Mood and Affect: Mood normal.        Behavior: Behavior normal.        Thought Content: Thought content normal.     Labs reviewed: Basic Metabolic Panel: Recent Labs    12/09/21 0800 07/15/22 1038 10/25/22 0700  NA 138 136 138  K 4.6 4.2 4.1  CL 104 102 104  CO2 GLUCOSE 124* 171* 124*  BUN CREATININE 0.69 0.87 0.73  CALCIUM 9.2 9.1 9.2  TSH 3.03  --  3.05   Liver Function Tests: Recent Labs    12/09/21 0800 07/15/22 1038 10/25/22 0700  AST ALT BILITOT 0.5 0.6 0.8  PROT 6.8 6.7 6.6   No results for input(s): "LIPASE", "AMYLASE" in the last 8760 hours. No results for input(s): "AMMONIA" in the last 8760 hours. CBC: Recent Labs    12/09/21 0800 07/15/22 1038 10/25/22 0700  WBC 9.8 7.5 8.7  NEUTROABS 3,861  4,125 3,376  HGB 13.7 13.5 12.9  HCT 39.7 39.1 38.3  MCV 92.3 90.5 90.8  PLT 268 305 280   Lipid Panel: Recent Labs    12/09/21 0800  CHOL 207*  HDL 58  LDLCALC 112*  TRIG 247*  CHOLHDL 3.6   Lab Results  Component Value Date   HGBA1C 7.2 (H) 07/15/2022    Procedures since last visit: No results found.  Assessment/Plan  Muscle cramps  Labs unremarkable, may consider Alprazolam or Methocarbamol   Hypertension blood pressure is controlled on Lisinopril, Atorvastatin, Bun/creat 18/0.73, Ca 9.2 11/04/22  Depression with anxiety Her mood has no change, prn Trazodone, TSH 3.05 10/25/22  Diabetes mellitus type 2, controlled ([facility])  off Metformin, last Hgb a1c 6.5 11/19/20<<7.2 07/15/22  Osteoporosis Continue Alendronate, Ca, Vit D  Hyperlipidemia associated with type 2 diabetes mellitus ([facility])  takes Atorvastatin, fish oil, LDL 103 11/18/20  Back pain with right-sided sciatica  Lower back pain, right sciatica, comes and goes for over a year.  ambulates with walker.   Incontinent of urine taking Oxybutynin, c/o  dry eyes, mouth, blurred vision.    Labs/tests ordered:  none  Next appt:  4 months

## 2022-10-27 NOTE — Assessment & Plan Note (Signed)
taking Oxybutynin, c/o  dry eyes, mouth, blurred vision.  

## 2022-10-27 NOTE — Assessment & Plan Note (Signed)
takes Atorvastatin, fish oil, LDL 103 11/18/20 

## 2022-10-27 NOTE — Assessment & Plan Note (Signed)
Labs unremarkable, may consider Alprazolam or Methocarbamol

## 2022-10-31 ENCOUNTER — Encounter: Payer: Self-pay | Admitting: Nurse Practitioner

## 2022-12-07 DIAGNOSIS — Z961 Presence of intraocular lens: Secondary | ICD-10-CM | POA: Diagnosis not present

## 2022-12-07 DIAGNOSIS — H353210 Exudative age-related macular degeneration, right eye, stage unspecified: Secondary | ICD-10-CM | POA: Diagnosis not present

## 2022-12-07 DIAGNOSIS — E119 Type 2 diabetes mellitus without complications: Secondary | ICD-10-CM | POA: Diagnosis not present

## 2022-12-07 DIAGNOSIS — H353122 Nonexudative age-related macular degeneration, left eye, intermediate dry stage: Secondary | ICD-10-CM | POA: Diagnosis not present

## 2022-12-13 DIAGNOSIS — Z961 Presence of intraocular lens: Secondary | ICD-10-CM | POA: Diagnosis not present

## 2022-12-13 DIAGNOSIS — H353211 Exudative age-related macular degeneration, right eye, with active choroidal neovascularization: Secondary | ICD-10-CM | POA: Diagnosis not present

## 2022-12-13 DIAGNOSIS — H353123 Nonexudative age-related macular degeneration, left eye, advanced atrophic without subfoveal involvement: Secondary | ICD-10-CM | POA: Diagnosis not present

## 2022-12-13 DIAGNOSIS — H35033 Hypertensive retinopathy, bilateral: Secondary | ICD-10-CM | POA: Diagnosis not present

## 2022-12-13 DIAGNOSIS — H43813 Vitreous degeneration, bilateral: Secondary | ICD-10-CM | POA: Diagnosis not present

## 2022-12-13 DIAGNOSIS — E119 Type 2 diabetes mellitus without complications: Secondary | ICD-10-CM | POA: Diagnosis not present

## 2023-01-10 DIAGNOSIS — H353211 Exudative age-related macular degeneration, right eye, with active choroidal neovascularization: Secondary | ICD-10-CM | POA: Diagnosis not present

## 2023-01-17 DIAGNOSIS — L570 Actinic keratosis: Secondary | ICD-10-CM | POA: Diagnosis not present

## 2023-01-17 DIAGNOSIS — L821 Other seborrheic keratosis: Secondary | ICD-10-CM | POA: Diagnosis not present

## 2023-01-17 DIAGNOSIS — L814 Other melanin hyperpigmentation: Secondary | ICD-10-CM | POA: Diagnosis not present

## 2023-01-30 ENCOUNTER — Other Ambulatory Visit: Payer: Self-pay | Admitting: Internal Medicine

## 2023-02-17 ENCOUNTER — Encounter: Payer: Self-pay | Admitting: Family Medicine

## 2023-02-20 ENCOUNTER — Encounter: Payer: Self-pay | Admitting: Family Medicine

## 2023-02-21 DIAGNOSIS — H353211 Exudative age-related macular degeneration, right eye, with active choroidal neovascularization: Secondary | ICD-10-CM | POA: Diagnosis not present

## 2023-02-23 ENCOUNTER — Encounter: Admitting: Nurse Practitioner

## 2023-03-02 ENCOUNTER — Encounter: Payer: Self-pay | Admitting: Nurse Practitioner

## 2023-03-02 ENCOUNTER — Non-Acute Institutional Stay: Payer: Medicare Other | Admitting: Nurse Practitioner

## 2023-03-02 VITALS — BP 124/78 | HR 96 | Temp 97.7°F | Resp 18 | Ht 63.0 in | Wt 163.2 lb

## 2023-03-02 DIAGNOSIS — E1121 Type 2 diabetes mellitus with diabetic nephropathy: Secondary | ICD-10-CM | POA: Diagnosis not present

## 2023-03-02 DIAGNOSIS — F418 Other specified anxiety disorders: Secondary | ICD-10-CM | POA: Diagnosis not present

## 2023-03-02 DIAGNOSIS — L853 Xerosis cutis: Secondary | ICD-10-CM | POA: Diagnosis not present

## 2023-03-02 DIAGNOSIS — I10 Essential (primary) hypertension: Secondary | ICD-10-CM | POA: Diagnosis not present

## 2023-03-02 DIAGNOSIS — E1169 Type 2 diabetes mellitus with other specified complication: Secondary | ICD-10-CM | POA: Diagnosis not present

## 2023-03-02 DIAGNOSIS — E785 Hyperlipidemia, unspecified: Secondary | ICD-10-CM

## 2023-03-02 DIAGNOSIS — M810 Age-related osteoporosis without current pathological fracture: Secondary | ICD-10-CM

## 2023-03-02 DIAGNOSIS — N3942 Incontinence without sensory awareness: Secondary | ICD-10-CM | POA: Diagnosis not present

## 2023-03-02 MED ORDER — HYDROCORTISONE 2.5 % EX CREA: TOPICAL_CREAM | Freq: Two times a day (BID) | CUTANEOUS | 2 refills | Status: DC

## 2023-03-02 NOTE — Assessment & Plan Note (Signed)
Continue Atorvastatin, update lipid panel.

## 2023-03-02 NOTE — Assessment & Plan Note (Signed)
off Metformin, last Hgb a1c 6.5 11/19/20<<7.2 07/15/22, update CBC/diff, CMP/eGFR, Hgb A1c, lipids.

## 2023-03-02 NOTE — Assessment & Plan Note (Signed)
blood pressure is controlled on Lisinopril, Atorvastatin, Bun/creat 18/0.73, Ca 9.2 11/04/22

## 2023-03-02 NOTE — Progress Notes (Signed)
Location:   Clinic [facility]   Place of Service:  Clinic (12) Provider: Chipper Oman NP  Code Status: DNR Goals of Care:     03/02/2023   11:10 AM  Advanced Directives  Does Patient Have a Medical Advance Directive? No  Would patient like information on creating a medical advance directive? No - Patient declined     Chief Complaint  Patient presents with   Follow-up    4 month f/u   Quality Metric Gaps    Needs to discuss foot exam, Medicare Annual Wellness visit, Hemoglobin A1C, DTAP, Covid, and Influenza vaccine.    HPI: Patient is a 89 y.o. female seen today for medical management of chronic diseases.    Muscle cramps, better,  Labs unremarkable, may consider Alprazolam or Methocarbamol              HTN, blood pressure is controlled on Lisinopril, Atorvastatin, Bun/creat 18/0.73, Ca 9.2 11/04/22             Her mood has no change, prn Trazodone, TSH 3.05 10/25/22             T2DM, off Metformin, last Hgb a1c 6.5 11/19/20<<7.2 07/15/22             Urinary frequency, taking Oxybutynin, c/o  dry eyes, mouth, blurred vision.              OP takes Alendronate. Ca, Vit D. DEXA 12/24/20 t score -2.6             Hyperlipidemia, takes Atorvastatin, fish oil, LDL 103 11/18/20             Lower back pain, right sciatica, comes and goes for over a year.      Past Medical History:  Diagnosis Date   Allergy    Arthritis    Diabetes mellitus    Hernia, abdominal    Hyperlipidemia    Hypertension    Osteopenia, senile 09/26/2012   T score - 1.6 Left femur    Past Surgical History:  Procedure Laterality Date   APPENDECTOMY     BACK SURGERY     1965   EYE SURGERY     GUM SURGERY     KNEE SURGERY      No Known Allergies  Allergies as of 03/02/2023   No Known Allergies      Medication List        Accurate as of March 02, 2023  4:14 PM. If you have any questions, ask your nurse or doctor.          ascorbic acid 500 MG tablet Commonly known as: VITAMIN C Take 500 mg by  mouth daily.   atorvastatin 10 MG tablet Commonly known as: LIPITOR TAKE ONE TABLET BY MOUTH ONCE DAILY   Biotene Moisturizing Mouth Soln Use as directed 5 sprays in the mouth or throat 3 (three) times daily as needed.   Calcium Carbonate-Vitamin D3 600-400 MG-UNIT Tabs Take 1 tablet by mouth 2 (two) times daily.   fish oil-omega-3 fatty acids 1000 MG capsule Take 2 g by mouth daily.   Flax Oil-Fish Oil-Borage Oil Caps Take by mouth daily.   guaiFENesin 600 MG 12 hr tablet Commonly known as: Mucinex Take 1 tablet (600 mg total) by mouth 2 (two) times daily.   hydrocortisone 2.5%-Eucerin equivalent 1:1 cream mixture Apply topically 2 (two) times daily. Started by: Conley Delisle X Harwood Nall   lisinopril 10 MG tablet Commonly known as: ZESTRIL TAKE ONE  TABLET BY MOUTH DAILY   magnesium oxide 400 MG tablet Commonly known as: MAG-OX Take 400 mg by mouth daily.   methocarbamol 500 MG tablet Commonly known as: ROBAXIN Take 0.5 tablets (250 mg total) by mouth every 8 (eight) hours as needed for muscle spasms.   omeprazole 20 MG capsule Commonly known as: PRILOSEC Take 1 capsule (20 mg total) by mouth daily.   oxybutynin 10 MG 24 hr tablet Commonly known as: DITROPAN-XL TAKE ONE TABLET AT BEDTIME.   PRESERVISION AREDS 2+MULTI VIT PO Take 1 tablet by mouth 2 (two) times daily.   traZODone 50 MG tablet Commonly known as: DESYREL Take 0.5 tablets (25 mg total) by mouth at bedtime as needed for sleep.   triamcinolone ointment 0.5 % Commonly known as: KENALOG Apply 1 Application topically 2 (two) times daily.   Vitamin B-12 5000 MCG Subl Place under the tongue daily.   Vitamin D3 50 MCG (2000 UT) Tabs Take 1 tablet by mouth daily.   vitamin E 45 MG (100 UNITS) capsule Take by mouth daily.        Review of Systems:  Review of Systems  Constitutional:  Negative for appetite change, fatigue and fever.  HENT:  Positive for hearing loss. Negative for congestion and sore throat.    Eyes:  Negative for visual disturbance.  Respiratory:  Negative for cough.   Cardiovascular:  Negative for leg swelling.  Gastrointestinal:  Negative for abdominal pain and constipation.       Acid reflux, belching at times, mucous collected in the back of mouth/throat.   Genitourinary:  Positive for frequency. Negative for dysuria, hematuria and urgency.       Average 6-7x/night.   Musculoskeletal:  Positive for arthralgias, back pain and gait problem.       Right sciatica. Walks with walker and cane. Left buttock and leg cramps occasionally when getting out of bed  Skin:  Positive for rash. Negative for color change.  Neurological:  Negative for speech difficulty, weakness and headaches.  Psychiatric/Behavioral:  Positive for sleep disturbance. Negative for behavioral problems. The patient is not nervous/anxious.        Memory lapses.     Health Maintenance  Topic Date Due   DTaP/Tdap/Td (2 - Td or Tdap) 12/15/2021   COVID-19 Vaccine (4 - 2023-24 season) 03/11/2022   Medicare Annual Wellness (AWV)  10/08/2022   HEMOGLOBIN A1C  01/13/2023   INFLUENZA VACCINE  02/09/2023   Zoster Vaccines- Shingrix (1 of 2) 02/09/2024 (Originally 10/04/1971)   OPHTHALMOLOGY EXAM  12/13/2023   FOOT EXAM  03/01/2024   Pneumonia Vaccine 81+ Years old  Completed   DEXA SCAN  Completed   HPV VACCINES  Aged Out    Physical Exam: Vitals:   03/02/23 1107  BP: 124/78  Pulse: 96  Resp: 18  Temp: 97.7 F (36.5 C)  SpO2: 96%  Weight: 163 lb 3.2 oz (74 kg)  Height: 5\' 3"  (1.6 m)   Body mass index is 28.91 kg/m. Physical Exam Vitals and nursing note reviewed.  Constitutional:      Appearance: Normal appearance.  HENT:     Head: Normocephalic and atraumatic.     Nose: Nose normal.     Mouth/Throat:     Mouth: Mucous membranes are moist.  Eyes:     Extraocular Movements: Extraocular movements intact.     Conjunctiva/sclera: Conjunctivae normal.     Pupils: Pupils are equal, round, and  reactive to light.  Cardiovascular:  Rate and Rhythm: Normal rate and regular rhythm.     Heart sounds: No murmur heard. Pulmonary:     Effort: Pulmonary effort is normal.     Breath sounds: Rales present. No rhonchi.     Comments: Bibasilar  Abdominal:     General: Bowel sounds are normal.     Palpations: Abdomen is soft.     Tenderness: There is no abdominal tenderness.  Musculoskeletal:     Cervical back: Normal range of motion and neck supple.     Right lower leg: No edema.     Left lower leg: No edema.  Skin:    General: Skin is warm and dry.     Findings: Rash present.     Comments: R+L shin small red papules itching, Vaseline no longer effective. Upper left buttock cyst about a golf ball seized, no pain. F/u dermatology.   Neurological:     Mental Status: She is alert. Mental status is at baseline.     Gait: Gait abnormal.     Comments: Oriented to person, place.   Psychiatric:        Mood and Affect: Mood normal.        Behavior: Behavior normal.        Thought Content: Thought content normal.     Labs reviewed: Basic Metabolic Panel: Recent Labs    07/15/22 1038 10/25/22 0700  NA 136 138  K 4.2 4.1  CL 102 104  CO2 21 25  GLUCOSE 171* 124*  BUN 18 18  CREATININE 0.87 0.73  CALCIUM 9.1 9.2  TSH  --  3.05   Liver Function Tests: Recent Labs    07/15/22 1038 10/25/22 0700  AST 16 18  ALT 11 14  BILITOT 0.6 0.8  PROT 6.7 6.6   No results for input(s): "LIPASE", "AMYLASE" in the last 8760 hours. No results for input(s): "AMMONIA" in the last 8760 hours. CBC: Recent Labs    07/15/22 1038 10/25/22 0700  WBC 7.5 8.7  NEUTROABS 4,125 3,376  HGB 13.5 12.9  HCT 39.1 38.3  MCV 90.5 90.8  PLT 305 280   Lipid Panel: No results for input(s): "CHOL", "HDL", "LDLCALC", "TRIG", "CHOLHDL", "LDLDIRECT" in the last 8760 hours. Lab Results  Component Value Date   HGBA1C 7.2 (H) 07/15/2022    Procedures since last visit: No results  found.  Assessment/Plan  Diabetes mellitus type 2, controlled (HCC) off Metformin, last Hgb a1c 6.5 11/19/20<<7.2 07/15/22, update CBC/diff, CMP/eGFR, Hgb A1c, lipids.   Hyperlipidemia associated with type 2 diabetes mellitus (HCC) Continue Atorvastatin, update lipid panel.   Hypertension  blood pressure is controlled on Lisinopril, Atorvastatin, Bun/creat 18/0.73, Ca 9.2 11/04/22  Depression with anxiety Stable, prn Trazodone, TSH 3.05 10/25/22  Incontinent of urine taking Oxybutynin, c/o  dry eyes, mouth, blurred vision.   Osteoporosis  takes Alendronate. Ca, Vit D. DEXA 12/24/20 t score -2.6, repeat DEXA  Dry skin dermatitis R L shin, 2.5% Hydrocortisone cream bid prn    Labs/tests ordered:  CBC/diff, CMP/eGFR, Hgb A1c, lipids 05/30/23. DEXA Next appt:  3 months AWV 2 weeks

## 2023-03-02 NOTE — Assessment & Plan Note (Addendum)
R L shin, 2.5% Hydrocortisone cream bid prn

## 2023-03-02 NOTE — Assessment & Plan Note (Signed)
takes Alendronate. Ca, Vit D. DEXA 12/24/20 t score -2.6, repeat DEXA

## 2023-03-02 NOTE — Assessment & Plan Note (Signed)
Stable, prn Trazodone, TSH 3.05 10/25/22

## 2023-03-02 NOTE — Assessment & Plan Note (Signed)
taking Oxybutynin, c/o  dry eyes, mouth, blurred vision.  

## 2023-03-06 ENCOUNTER — Telehealth: Payer: Self-pay

## 2023-03-06 NOTE — Telephone Encounter (Signed)
Please send clarification on patient's prescription to pharmacy. What they are needing clarified is under the media tab. Rosey Bath at [facility] called regarding medication.

## 2023-03-07 ENCOUNTER — Other Ambulatory Visit: Payer: Self-pay | Admitting: Nurse Practitioner

## 2023-03-07 MED ORDER — HYDROCORTISONE 2.5 % EX CREA: TOPICAL_CREAM | Freq: Two times a day (BID) | CUTANEOUS | 2 refills | Status: DC

## 2023-03-16 ENCOUNTER — Non-Acute Institutional Stay (INDEPENDENT_AMBULATORY_CARE_PROVIDER_SITE_OTHER): Payer: Medicare Other | Admitting: Nurse Practitioner

## 2023-03-16 ENCOUNTER — Encounter: Payer: Self-pay | Admitting: Nurse Practitioner

## 2023-03-16 VITALS — BP 127/78 | HR 97 | Temp 98.2°F | Resp 18 | Ht 63.0 in | Wt 163.0 lb

## 2023-03-16 DIAGNOSIS — E1121 Type 2 diabetes mellitus with diabetic nephropathy: Secondary | ICD-10-CM

## 2023-03-16 DIAGNOSIS — I10 Essential (primary) hypertension: Secondary | ICD-10-CM

## 2023-03-16 DIAGNOSIS — M810 Age-related osteoporosis without current pathological fracture: Secondary | ICD-10-CM

## 2023-03-16 DIAGNOSIS — Z0000 Encounter for general adult medical examination without abnormal findings: Secondary | ICD-10-CM

## 2023-03-16 DIAGNOSIS — E785 Hyperlipidemia, unspecified: Secondary | ICD-10-CM

## 2023-03-16 DIAGNOSIS — M5431 Sciatica, right side: Secondary | ICD-10-CM

## 2023-03-16 DIAGNOSIS — E1169 Type 2 diabetes mellitus with other specified complication: Secondary | ICD-10-CM

## 2023-03-16 DIAGNOSIS — N3942 Incontinence without sensory awareness: Secondary | ICD-10-CM | POA: Diagnosis not present

## 2023-03-16 DIAGNOSIS — F418 Other specified anxiety disorders: Secondary | ICD-10-CM

## 2023-03-16 DIAGNOSIS — R252 Cramp and spasm: Secondary | ICD-10-CM

## 2023-03-16 NOTE — Assessment & Plan Note (Signed)
takes Atorvastatin, fish oil, LDL 103 11/18/20 

## 2023-03-16 NOTE — Assessment & Plan Note (Signed)
off Metformin, last Hgb a1c 6.5 11/19/20<<7.2 07/15/22

## 2023-03-16 NOTE — Assessment & Plan Note (Signed)
blood pressure is controlled on Lisinopril, Atorvastatin, Bun/creat 18/0.73, Ca 9.2 11/04/22

## 2023-03-16 NOTE — Progress Notes (Signed)
Subjective:   Catherine Ellis is a 89 y.o. female who presents for Medicare Annual (Subsequent) preventive examination.  Visit Complete: In person  Patient Medicare AWV questionnaire was completed by the patient on 03/16/23; I have confirmed that all information answered by patient is correct and no changes since this date.   Cardiac Risk Factors include: advanced age (>44men, >42 women);diabetes mellitus;dyslipidemia     Objective:    Today's Vitals   03/16/23 0835 03/16/23 1340  BP: 127/78   Pulse: 97   Resp: 18   Temp: 98.2 F (36.8 C)   SpO2: 97%   Weight: 163 lb (73.9 kg)   Height: 5\' 3"  (1.6 m)   PainSc: 0-No pain 5    Body mass index is 28.87 kg/m.     03/16/2023    8:44 AM 03/02/2023   11:10 AM 06/10/2021    3:30 PM 12/03/2020    1:22 PM 11/19/2020    6:14 PM 11/19/2020    3:46 PM 08/06/2020    2:42 PM  Advanced Directives  Does Patient Have a Medical Advance Directive? No No Yes No Yes No Yes  Type of Surveyor, minerals;Living will  Out of facility DNR (pink MOST or yellow form)  Healthcare Power of Attorney  Does patient want to make changes to medical advance directive?   Yes (ED - send information to MyChart)    No - Patient declined  Copy of Healthcare Power of Attorney in Chart?   No - copy requested    No - copy requested  Would patient like information on creating a medical advance directive? No - Patient declined No - Patient declined  No - Patient declined  No - Patient declined     Current Medications (verified) Outpatient Encounter Medications as of 03/16/2023  Medication Sig   Artificial Saliva (BIOTENE MOISTURIZING MOUTH) SOLN Use as directed 5 sprays in the mouth or throat 3 (three) times daily as needed.   atorvastatin (LIPITOR) 10 MG tablet TAKE ONE TABLET BY MOUTH ONCE DAILY   Calcium Carbonate-Vitamin D3 600-400 MG-UNIT TABS Take 1 tablet by mouth 2 (two) times daily.   Cholecalciferol (VITAMIN D3) 50 MCG (2000  UT) TABS Take 1 tablet by mouth daily.   Cyanocobalamin (VITAMIN B-12) 5000 MCG SUBL Place under the tongue daily.   fish oil-omega-3 fatty acids 1000 MG capsule Take 2 g by mouth daily.   Flax Oil-Fish Oil-Borage Oil CAPS Take by mouth daily.   guaiFENesin (MUCINEX) 600 MG 12 hr tablet Take 1 tablet (600 mg total) by mouth 2 (two) times daily.   hydrocortisone 2.5%-Eucerin equivalent 1:1 cream mixture Apply topically 2 (two) times daily.   lisinopril (ZESTRIL) 10 MG tablet TAKE ONE TABLET BY MOUTH DAILY   magnesium oxide (MAG-OX) 400 MG tablet Take 400 mg by mouth daily.   methocarbamol (ROBAXIN) 500 MG tablet Take 0.5 tablets (250 mg total) by mouth every 8 (eight) hours as needed for muscle spasms.   Multiple Vitamins-Minerals (PRESERVISION AREDS 2+MULTI VIT PO) Take 1 tablet by mouth 2 (two) times daily.   omeprazole (PRILOSEC) 20 MG capsule Take 1 capsule (20 mg total) by mouth daily.   oxybutynin (DITROPAN-XL) 10 MG 24 hr tablet TAKE ONE TABLET AT BEDTIME.   traZODone (DESYREL) 50 MG tablet Take 0.5 tablets (25 mg total) by mouth at bedtime as needed for sleep.   triamcinolone ointment (KENALOG) 0.5 % Apply 1 Application topically 2 (two) times daily.  vitamin C (ASCORBIC ACID) 500 MG tablet Take 500 mg by mouth daily.   vitamin E 100 UNIT capsule Take by mouth daily.   Facility-Administered Encounter Medications as of 03/16/2023  Medication   0.9 %  sodium chloride infusion    Allergies (verified) Patient has no known allergies.   History: Past Medical History:  Diagnosis Date   Allergy    Arthritis    Diabetes mellitus    Hernia, abdominal    Hyperlipidemia    Hypertension    Osteopenia, senile 09/26/2012   T score - 1.6 Left femur   Past Surgical History:  Procedure Laterality Date   APPENDECTOMY     BACK SURGERY     1965   EYE SURGERY     GUM SURGERY     KNEE SURGERY     Family History  Problem Relation Age of Onset   Stroke Mother    Heart disease Father     Social History   Socioeconomic History   Marital status: Single    Spouse name: Not on file   Number of children: Not on file   Years of education: Not on file   Highest education level: Not on file  Occupational History   Not on file  Tobacco Use   Smoking status: Never   Smokeless tobacco: Never  Vaping Use   Vaping status: Never Used  Substance and Sexual Activity   Alcohol use: No   Drug use: No   Sexual activity: Not on file  Other Topics Concern   Not on file  Social History Narrative   Tobacco use, amount per day now: None      Past tobacco use, amount per day: None      How many years did you use tobacco: None      Alcohol use (drinks per week): None      Diet: Regular      Do you drink/eat things with caffeine? Sweet tea      Marital status: Single            What year were you married?      Do you live in a house, apartment, assisted living, condo, trailer? Apartment      Is it one or more stories? 1      How many persons live in your home? 1      Do you have any pets in your home? 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 Yes             How often? Water exercise 5 days a week.        Do you have a living will? Yes      Do you have a DNR form? No            If not, do you want to discuss one? Yes      Do you have signed POA/HPOA forms?  Yes            Social Determinants of Health   Financial Resource Strain: Low Risk  (06/09/2017)   Overall Financial Resource Strain (CARDIA)    Difficulty of Paying Living Expenses: Not hard at all  Food Insecurity: No Food Insecurity (06/09/2017)   Hunger Vital Sign    Worried About Running Out of Food in the Last Year: Never true    Ran Out of Food in the Last Year: Never true  Transportation Needs: No Transportation Needs (06/09/2017)   PRAPARE - Administrator, Civil Service (Medical): No    Lack of Transportation (Non-Medical): No  Physical Activity: Inactive  (06/09/2017)   Exercise Vital Sign    Days of Exercise per Week: 0 days    Minutes of Exercise per Session: 0 min  Stress: No Stress Concern Present (06/09/2017)   [employer] of Occupational Health - Occupational Stress Questionnaire    Feeling of Stress : Only a little  Social Connections: Moderately Integrated (06/09/2017)   Social Connection and Isolation Panel [NHANES]    Frequency of Communication with Friends and Family: More than three times a week    Frequency of Social Gatherings with Friends and Family: More than three times a week    Attends Religious Services: 1 to 4 times per year    Active Member of [employer] or Organizations: Yes    Attends Banker Meetings: Never    Marital Status: Never married    Tobacco Counseling Counseling given: Not Answered   Clinical Intake:  Pre-visit preparation completed: Yes  Pain : 0-10 Pain Score: 5  Pain Type: Chronic pain Pain Location: Back Pain Orientation: Mid Pain Radiating Towards: none Pain Descriptors / Indicators: Aching Pain Onset: More than a month ago Pain Frequency: Occasional Pain Relieving Factors: change position Effect of Pain on Daily Activities: can not sit too long  Pain Relieving Factors: change position  BMI - recorded: 28.87 Nutritional Status: BMI 25 -29 Overweight Nutritional Risks: Unintentional weight loss Diabetes: Yes CBG done?: No Did pt. bring in CBG monitor from home?: No  How often do you need to have someone help you when you read instructions, pamphlets, or other written materials from your doctor or pharmacy?: 2 - Rarely What is the last grade level you completed in school?: high school  Interpreter Needed?: No      Activities of Daily Living    03/16/2023    1:44 PM  In your present state of health, do you have any difficulty performing the following activities:  Hearing? 0  Vision? 1  Difficulty concentrating or making decisions? 0  Walking or climbing  stairs? 1  Dressing or bathing? 0  Doing errands, shopping? 0  Preparing Food and eating ? N  Using the Toilet? N  In the past six months, have you accidently leaked urine? Y  Do you have problems with loss of bowel control? N  Managing your Medications? N  Managing your Finances? N  Housekeeping or managing your Housekeeping? Y    Patient Care Team: Mahlon Gammon, MD as PCP - General (Internal Medicine) Aramis Weil X, NP as Nurse Practitioner (Internal Medicine)  Indicate any recent Medical Services you may have received from other than [facility] providers in the past year (date may be approximate).     Assessment:   This is a routine wellness examination for [place].  Hearing/Vision screen No results found.   Goals Addressed             This Visit's Progress    Maintain Mobility and Function       Evidence-based guidance:  Acknowledge and validate impact of pain, loss of strength and potential disfigurement (hand osteoarthritis) on mental health and daily life, such as social isolation, anxiety, depression, impaired sexual relationship and   injury from falls.  Anticipate referral to physical or occupational therapy for assessment, therapeutic exercise and recommendation for adaptive equipment or assistive devices; encourage participation.  Assess impact on ability to perform activities of daily living, as well as engage in sports and leisure events or requirements of work or school.  Provide anticipatory guidance and reassurance about the benefit of exercise to maintain function; acknowledge and normalize fear that exercise may worsen symptoms.  Encourage regular exercise, at least 10 minutes at a time for 45 minutes per week; consider yoga, water exercise and proprioceptive exercises; encourage use of wearable activity tracker to increase motivation and adherence.  Encourage maintenance or resumption of daily activities, including employment, as pain allows and with minimal  exposure to trauma.  Assist patient to advocate for adaptations to the work environment.  Consider level of pain and function, gender, age, lifestyle, patient preference, quality of life, readiness and ?ocapacity to benefit? when recommending patients for orthopaedic surgery consultation.  Explore strategies, such as changes to medication regimen or activity that enables patient to anticipate and manage flare-ups that increase deconditioning and disability.  Explore patient preferences; encourage exposure to a broader range of activities that have been avoided for fear of experiencing pain.  Identify barriers to participation in therapy or exercise, such as pain with activity, anticipated or imagined pain.  Monitor postoperative joint replacement or any preexisting joint replacement for ongoing pain and loss of function; provide social support and encouragement throughout recovery.   Notes:        Depression Screen    03/16/2023    1:46 PM 03/16/2023    1:34 PM 03/02/2023    2:33 PM 10/07/2021    3:17 PM 08/06/2020    2:31 PM 06/09/2017    8:50 AM 09/12/2016    3:59 PM  PHQ 2/9 Scores  PHQ - 2 Score 0 0 0 0 0 0 0  PHQ- 9 Score  0 0        Fall Risk    03/16/2023    1:33 PM 03/02/2023    2:33 PM 10/27/2022    1:53 PM 06/01/2022    1:37 PM 02/23/2022    2:07 PM  Fall Risk   Falls in the past year? 0 0 0 0 0  Number falls in past yr: 0 0 0 0 0  Injury with Fall? 0 0 0 0 0  Risk for fall due to : No Fall Risks No Fall Risks No Fall Risks No Fall Risks No Fall Risks  Follow up Falls evaluation completed Falls evaluation completed Falls evaluation completed Falls evaluation completed Falls evaluation completed    MEDICARE RISK AT HOME: Medicare Risk at Home Any stairs in or around the home?: Yes Home free of loose throw rugs in walkways, pet beds, electrical cords, etc?: Yes Adequate lighting in your home to reduce risk of falls?: Yes Life alert?: No Use of a cane, walker or w/c?:  Yes Grab bars in the bathroom?: Yes Shower chair or bench in shower?: Yes Elevated toilet seat or a handicapped toilet?: Yes  TIMED UP AND GO:  Was the test performed?  Yes  Length of time to ambulate 10 feet: 6 sec Gait steady and fast with assistive device    Cognitive Function:    10/07/2021    3:08 PM 09/13/2019   10:45 AM 03/06/2018    8:27 AM 04/25/2017   10:49 AM 04/25/2017    9:53 AM  MMSE - Mini Mental State Exam  Not completed:    --   Orientation to time 5 5 5 5 5   Orientation to Place 5 5 5 5  5  Registration 3 3 3 3 3   Attention/ Calculation 5 1 4 4 4   Recall 3 1 1 1 1   Language- name 2 objects 2 2 2 2 2   Language- repeat 1 1 1 1 1   Language- follow 3 step command 3 3 3 3 3   Language- read & follow direction 1 1 1 1 1   Write a sentence 1 1 1 1 1   Copy design 0 1 1 1 1   Total score 29 24 27 27 27         08/06/2020    2:42 PM  6CIT Screen  What Year? 0 points  What month? 0 points  What time? 0 points  Count back from 20 0 points  Months in reverse 0 points  Repeat phrase 0 points  Total Score 0 points    Immunizations Immunization History  Administered Date(s) Administered   Influenza Split 04/13/2012   Influenza Whole 04/12/2018   Influenza, High Dose Seasonal PF 04/24/2019, 04/22/2020   Influenza,inj,Quad PF,6+ Mos 06/14/2013, 04/14/2014, 04/27/2015   Influenza-Unspecified 04/10/2016   Moderna Sars-Covid-2 Vaccination 07/15/2019, 08/12/2019, 05/19/2020   PPD Test 12/16/2011, 12/16/2011   Pneumococcal Conjugate-13 07/14/2014   Pneumococcal Polysaccharide-23 01/04/2010   Tdap 12/16/2011    TDAP status: Due, Education has been provided regarding the importance of this vaccine. Advised may receive this vaccine at local pharmacy or Health Dept. Aware to provide a copy of the vaccination record if obtained from local pharmacy or Health Dept. Verbalized acceptance and understanding.  Flu Vaccine status: Due, Education has been provided regarding the  importance of this vaccine. Advised may receive this vaccine at local pharmacy or Health Dept. Aware to provide a copy of the vaccination record if obtained from local pharmacy or Health Dept. Verbalized acceptance and understanding.  Pneumococcal vaccine status: Due, Education has been provided regarding the importance of this vaccine. Advised may receive this vaccine at local pharmacy or Health Dept. Aware to provide a copy of the vaccination record if obtained from local pharmacy or Health Dept. Verbalized acceptance and understanding.  Covid-19 vaccine status: Information provided on how to obtain vaccines.   Qualifies for Shingles Vaccine? Yes   Zostavax completed No   Shingrix Completed?: No.    Education has been provided regarding the importance of this vaccine. Patient has been advised to call insurance company to determine out of pocket expense if they have not yet received this vaccine. Advised may also receive vaccine at local pharmacy or Health Dept. Verbalized acceptance and understanding.  Screening Tests Health Maintenance  Topic Date Due   DTaP/Tdap/Td (2 - Td or Tdap) 12/15/2021   HEMOGLOBIN A1C  01/13/2023   INFLUENZA VACCINE  02/09/2023   COVID-19 Vaccine (4 - 2023-24 season) 03/12/2023   Zoster Vaccines- Shingrix (1 of 2) 02/09/2024 (Originally 10/04/1971)   OPHTHALMOLOGY EXAM  12/13/2023   FOOT EXAM  03/01/2024   Medicare Annual Wellness (AWV)  03/15/2024   Pneumonia Vaccine 30+ Years old  Completed   DEXA SCAN  Completed   HPV VACCINES  Aged Out    Health Maintenance  Health Maintenance Due  Topic Date Due   DTaP/Tdap/Td (2 - Td or Tdap) 12/15/2021   HEMOGLOBIN A1C  01/13/2023   INFLUENZA VACCINE  02/09/2023   COVID-19 Vaccine (4 - 2023-24 season) 03/12/2023    Colorectal cancer screening: No longer required.   Mammogram status: No longer required due to aged out.  Bone Density status: Ordered 03/16/23. Pt provided with contact info and advised to call  to  schedule appt.  Lung Cancer Screening: (Low Dose CT Chest recommended if Age 28-80 years, 20 pack-year currently smoking OR have quit w/in 15years.) does not qualify.   Lung Cancer Screening Referral: none  Additional Screening:  Hepatitis C Screening: does not qualify;   Vision Screening: Recommended annual ophthalmology exams for early detection of glaucoma and other disorders of the eye. Is the patient up to date with their annual eye exam?  Yes  Who is the provider or what is the name of the office in which the patient attends annual eye exams? Dr. Kari Baars  If pt is not established with a provider, would they like to be referred to a provider to establish care? No .   Dental Screening: Recommended annual dental exams for proper oral hygiene  Diabetic Foot Exam: done  Community Resource Referral / Chronic Care Management: CRR required this visit?  No   CCM required this visit?  No     Plan:     I have personally reviewed and noted the following in the patient's chart:   Medical and social history Use of alcohol, tobacco or illicit drugs  Current medications and supplements including opioid prescriptions. Patient is not currently taking opioid prescriptions. Functional ability and status Nutritional status Physical activity Advanced directives List of other physicians Hospitalizations, surgeries, and ER visits in previous 12 months Vitals Screenings to include cognitive, depression, and falls Referrals and appointments  In addition, I have reviewed and discussed with patient certain preventive protocols, quality metrics, and best practice recommendations. A written personalized care plan for preventive services as well as general preventive health recommendations were provided to patient.     Nishka Heide X Mishon Blubaugh, NP   03/16/2023

## 2023-03-16 NOTE — Assessment & Plan Note (Signed)
right sciatica, comes and goes for over a year.  

## 2023-03-16 NOTE — Assessment & Plan Note (Signed)
,   prn Trazodone, TSH 3.05 10/25/22

## 2023-03-16 NOTE — Assessment & Plan Note (Signed)
takes Alendronate. Ca, Vit D. DEXA 12/24/20 t score -2.6 ?

## 2023-03-16 NOTE — Assessment & Plan Note (Signed)
Labs unremarkable, may consider Alprazolam or Methocarbamol

## 2023-03-16 NOTE — Assessment & Plan Note (Signed)
taking Oxybutynin, c/o  dry eyes, mouth, blurred vision.  

## 2023-03-24 DIAGNOSIS — H43813 Vitreous degeneration, bilateral: Secondary | ICD-10-CM | POA: Diagnosis not present

## 2023-03-24 DIAGNOSIS — H353211 Exudative age-related macular degeneration, right eye, with active choroidal neovascularization: Secondary | ICD-10-CM | POA: Diagnosis not present

## 2023-03-24 DIAGNOSIS — H35033 Hypertensive retinopathy, bilateral: Secondary | ICD-10-CM | POA: Diagnosis not present

## 2023-03-24 DIAGNOSIS — Z961 Presence of intraocular lens: Secondary | ICD-10-CM | POA: Diagnosis not present

## 2023-03-24 DIAGNOSIS — H353123 Nonexudative age-related macular degeneration, left eye, advanced atrophic without subfoveal involvement: Secondary | ICD-10-CM | POA: Diagnosis not present

## 2023-03-24 DIAGNOSIS — E119 Type 2 diabetes mellitus without complications: Secondary | ICD-10-CM | POA: Diagnosis not present

## 2023-03-24 LAB — HM DIABETES EYE EXAM

## 2023-04-18 DIAGNOSIS — L814 Other melanin hyperpigmentation: Secondary | ICD-10-CM | POA: Diagnosis not present

## 2023-04-18 DIAGNOSIS — L821 Other seborrheic keratosis: Secondary | ICD-10-CM | POA: Diagnosis not present

## 2023-04-18 DIAGNOSIS — L570 Actinic keratosis: Secondary | ICD-10-CM | POA: Diagnosis not present

## 2023-04-27 ENCOUNTER — Other Ambulatory Visit: Payer: Self-pay | Admitting: Family Medicine

## 2023-05-08 DIAGNOSIS — H353211 Exudative age-related macular degeneration, right eye, with active choroidal neovascularization: Secondary | ICD-10-CM | POA: Diagnosis not present

## 2023-05-10 DIAGNOSIS — Z23 Encounter for immunization: Secondary | ICD-10-CM | POA: Diagnosis not present

## 2023-05-23 DIAGNOSIS — H353211 Exudative age-related macular degeneration, right eye, with active choroidal neovascularization: Secondary | ICD-10-CM | POA: Diagnosis not present

## 2023-05-23 DIAGNOSIS — H353123 Nonexudative age-related macular degeneration, left eye, advanced atrophic without subfoveal involvement: Secondary | ICD-10-CM | POA: Diagnosis not present

## 2023-05-23 DIAGNOSIS — Z961 Presence of intraocular lens: Secondary | ICD-10-CM | POA: Diagnosis not present

## 2023-05-23 DIAGNOSIS — E119 Type 2 diabetes mellitus without complications: Secondary | ICD-10-CM | POA: Diagnosis not present

## 2023-05-23 DIAGNOSIS — H04123 Dry eye syndrome of bilateral lacrimal glands: Secondary | ICD-10-CM | POA: Diagnosis not present

## 2023-05-23 DIAGNOSIS — H35033 Hypertensive retinopathy, bilateral: Secondary | ICD-10-CM | POA: Diagnosis not present

## 2023-05-23 DIAGNOSIS — H43813 Vitreous degeneration, bilateral: Secondary | ICD-10-CM | POA: Diagnosis not present

## 2023-05-23 LAB — HM DIABETES EYE EXAM

## 2023-05-30 ENCOUNTER — Other Ambulatory Visit: Payer: Medicare Other

## 2023-05-30 DIAGNOSIS — E1121 Type 2 diabetes mellitus with diabetic nephropathy: Secondary | ICD-10-CM | POA: Diagnosis not present

## 2023-05-31 LAB — COMPLETE METABOLIC PANEL WITH GFR
AG Ratio: 1.6 (calc) (ref 1.0–2.5)
ALT: 14 U/L (ref 6–29)
AST: 20 U/L (ref 10–35)
Albumin: 4.5 g/dL (ref 3.6–5.1)
Alkaline phosphatase (APISO): 70 U/L (ref 37–153)
BUN: 15 mg/dL (ref 7–25)
CO2: 26 mmol/L (ref 20–32)
Calcium: 10.1 mg/dL (ref 8.6–10.4)
Chloride: 101 mmol/L (ref 98–110)
Creat: 0.84 mg/dL (ref 0.60–0.95)
Globulin: 2.9 g/dL (ref 1.9–3.7)
Glucose, Bld: 164 mg/dL — ABNORMAL HIGH (ref 65–99)
Potassium: 4.2 mmol/L (ref 3.5–5.3)
Sodium: 139 mmol/L (ref 135–146)
Total Bilirubin: 0.9 mg/dL (ref 0.2–1.2)
Total Protein: 7.4 g/dL (ref 6.1–8.1)
eGFR: 62 mL/min/{1.73_m2} (ref >=60)

## 2023-05-31 LAB — CBC WITH DIFFERENTIAL/PLATELET
Absolute Lymphocytes: 6256 {cells}/uL — ABNORMAL HIGH (ref 850–3900)
Absolute Monocytes: 1173 {cells}/uL — ABNORMAL HIGH (ref 200–950)
Basophils Absolute: 92 {cells}/uL (ref 0–200)
Basophils Relative: 0.8 %
Eosinophils Absolute: 207 {cells}/uL (ref 15–500)
Eosinophils Relative: 1.8 %
HCT: 42.8 % (ref 35.0–45.0)
Hemoglobin: 14.4 g/dL (ref 11.7–15.5)
MCH: 30.8 pg (ref 27.0–33.0)
MCHC: 33.6 g/dL (ref 32.0–36.0)
MCV: 91.5 fL (ref 80.0–100.0)
MPV: 11.6 fL (ref 7.5–12.5)
Monocytes Relative: 10.2 %
Neutro Abs: 3772 {cells}/uL (ref 1500–7800)
Neutrophils Relative %: 32.8 %
Platelets: 313 10*3/uL (ref 140–400)
RBC: 4.68 10*6/uL (ref 3.80–5.10)
RDW: 12.7 % (ref 11.0–15.0)
Total Lymphocyte: 54.4 %
WBC: 11.5 10*3/uL — ABNORMAL HIGH (ref 3.8–10.8)

## 2023-05-31 LAB — HEMOGLOBIN A1C
Hgb A1c MFr Bld: 8.1 %{Hb} — ABNORMAL HIGH (ref <5.7)
Mean Plasma Glucose: 186 mg/dL
eAG (mmol/L): 10.3 mmol/L

## 2023-05-31 LAB — LIPID PANEL
Cholesterol: 233 mg/dL — ABNORMAL HIGH (ref <200)
HDL: 63 mg/dL (ref >=50)
LDL Cholesterol (Calc): 126 mg/dL — ABNORMAL HIGH
Non-HDL Cholesterol (Calc): 170 mg/dL — ABNORMAL HIGH (ref <130)
Total CHOL/HDL Ratio: 3.7 (calc) (ref <5.0)
Triglycerides: 288 mg/dL — ABNORMAL HIGH (ref <150)

## 2023-06-01 ENCOUNTER — Non-Acute Institutional Stay: Payer: Medicare Other | Admitting: Nurse Practitioner

## 2023-06-01 ENCOUNTER — Encounter: Payer: Self-pay | Admitting: Nurse Practitioner

## 2023-06-01 VITALS — BP 124/72 | HR 90 | Temp 97.2°F | Resp 18 | Ht 63.0 in | Wt 169.4 lb

## 2023-06-01 DIAGNOSIS — M810 Age-related osteoporosis without current pathological fracture: Secondary | ICD-10-CM | POA: Diagnosis not present

## 2023-06-01 DIAGNOSIS — E1121 Type 2 diabetes mellitus with diabetic nephropathy: Secondary | ICD-10-CM | POA: Diagnosis not present

## 2023-06-01 DIAGNOSIS — M5431 Sciatica, right side: Secondary | ICD-10-CM | POA: Diagnosis not present

## 2023-06-01 DIAGNOSIS — F418 Other specified anxiety disorders: Secondary | ICD-10-CM | POA: Diagnosis not present

## 2023-06-01 DIAGNOSIS — I10 Essential (primary) hypertension: Secondary | ICD-10-CM | POA: Diagnosis not present

## 2023-06-01 DIAGNOSIS — E785 Hyperlipidemia, unspecified: Secondary | ICD-10-CM

## 2023-06-01 DIAGNOSIS — N3942 Incontinence without sensory awareness: Secondary | ICD-10-CM

## 2023-06-01 MED ORDER — METFORMIN HCL 500 MG PO TABS
500.0000 mg | ORAL_TABLET | Freq: Every day | ORAL | 3 refills | Status: DC
Start: 2023-06-01 — End: 2024-01-18

## 2023-06-01 NOTE — Assessment & Plan Note (Signed)
89 05/30/23, taking Atorvastatin

## 2023-06-01 NOTE — Assessment & Plan Note (Signed)
blood pressure is controlled on Lisinopril, Atorvastatin, Bun/creat 15/0.84, Ca 10.1 05/30/23

## 2023-06-01 NOTE — Assessment & Plan Note (Signed)
Stable, prn Trazodone, TSH 3.05 10/25/22

## 2023-06-01 NOTE — Assessment & Plan Note (Addendum)
takes Alendronate. Ca, Vit D. DEXA 12/24/20 t score -2.6. 03/02/23 declined DEXA

## 2023-06-01 NOTE — Assessment & Plan Note (Signed)
restart Metformin 500mg  qd, last Hgb a1c 6.5 11/19/20<<7.2 07/15/22<<8.1 05/30/23

## 2023-06-01 NOTE — Progress Notes (Signed)
Location:   Clinic [facility]   Place of Service:  Clinic (12) Provider: Chipper Oman NP  Code Status: DNR Goals of Care:     06/01/2023    2:02 PM  Advanced Directives  Does Patient Have a Medical Advance Directive? No  Would patient like information on creating a medical advance directive? No - Patient declined     No chief complaint on file.   HPI: Patient is a 89 y.o. female seen today for medical management of chronic diseases.     Muscle cramps, better,  Labs unremarkable, may consider Alprazolam or Methocarbamol              HTN, blood pressure is controlled on Lisinopril, Atorvastatin, Bun/creat 15/0.84, Ca 10.1 05/30/23  LDL 126 05/30/23, taking Atorvastatin             Her mood has no change, prn Trazodone, TSH 3.05 10/25/22             T2DM, restart Metformin 500mg  qd, last Hgb a1c 6.5 11/19/20<<7.2 07/15/22<<8.1 05/30/23             Urinary frequency, taking Oxybutynin, c/o  dry eyes, mouth, blurred vision.              OP takes Alendronate. Ca, Vit D. DEXA 12/24/20 t score -2.6, 03/02/23 declined DEXA             Lower back pain, right sciatica, comes and goes for over a year.       Past Medical History:  Diagnosis Date   Allergy    Arthritis    Diabetes mellitus    Hernia, abdominal    Hyperlipidemia    Hypertension    Osteopenia, senile 09/26/2012   T score - 1.6 Left femur    Past Surgical History:  Procedure Laterality Date   APPENDECTOMY     BACK SURGERY     1965   EYE SURGERY     GUM SURGERY     KNEE SURGERY      No Known Allergies  Allergies as of 06/01/2023   No Known Allergies      Medication List        Accurate as of June 01, 2023 11:59 PM. If you have any questions, ask your nurse or doctor.          ascorbic acid 500 MG tablet Commonly known as: VITAMIN C Take 500 mg by mouth daily.   atorvastatin 10 MG tablet Commonly known as: LIPITOR TAKE ONE TABLET BY MOUTH ONCE DAILY   Biotene Moisturizing Mouth Soln Use as  directed 5 sprays in the mouth or throat 3 (three) times daily as needed.   Calcium Carbonate-Vitamin D3 600-400 MG-UNIT Tabs Take 1 tablet by mouth 2 (two) times daily.   fish oil-omega-3 fatty acids 1000 MG capsule Take 2 g by mouth daily.   Flax Oil-Fish Oil-Borage Oil Caps Take by mouth daily.   guaiFENesin 600 MG 12 hr tablet Commonly known as: Mucinex Take 1 tablet (600 mg total) by mouth 2 (two) times daily.   hydrocortisone 2.5%-Eucerin equivalent 1:1 cream mixture Apply topically 2 (two) times daily.   lisinopril 10 MG tablet Commonly known as: ZESTRIL TAKE ONE TABLET BY MOUTH DAILY   magnesium oxide 400 MG tablet Commonly known as: MAG-OX Take 400 mg by mouth daily.   metFORMIN 500 MG tablet Commonly known as: GLUCOPHAGE Take 1 tablet (500 mg total) by mouth daily with breakfast. Started by: Julienne Vogler X  Rowe Warman   methocarbamol 500 MG tablet Commonly known as: ROBAXIN Take 0.5 tablets (250 mg total) by mouth every 8 (eight) hours as needed for muscle spasms.   omeprazole 20 MG capsule Commonly known as: PRILOSEC Take 1 capsule (20 mg total) by mouth daily.   oxybutynin 10 MG 24 hr tablet Commonly known as: DITROPAN-XL TAKE ONE TABLET AT BEDTIME.   PRESERVISION AREDS 2+MULTI VIT PO Take 1 tablet by mouth 2 (two) times daily.   traZODone 50 MG tablet Commonly known as: DESYREL Take 0.5 tablets (25 mg total) by mouth at bedtime as needed for sleep.   triamcinolone ointment 0.5 % Commonly known as: KENALOG Apply 1 Application topically 2 (two) times daily.   Vitamin B-12 5000 MCG Subl Place under the tongue daily.   Vitamin D3 50 MCG (2000 UT) Tabs Take 1 tablet by mouth daily.   vitamin E 45 MG (100 UNITS) capsule Take by mouth daily.        Review of Systems:  Review of Systems  Constitutional:  Negative for appetite change, fatigue and fever.  HENT:  Positive for hearing loss. Negative for congestion and sore throat.   Eyes:  Negative for visual  disturbance.  Respiratory:  Negative for cough.   Cardiovascular:  Negative for leg swelling.  Gastrointestinal:  Negative for abdominal pain and constipation.       Acid reflux, belching at times, mucous collected in the back of mouth/throat.   Genitourinary:  Positive for frequency. Negative for dysuria, hematuria and urgency.       Average 6-7x/night.   Musculoskeletal:  Positive for arthralgias, back pain and gait problem.       Right sciatica. Walks with walker and cane. Left buttock and leg cramps occasionally when getting out of bed  Skin:  Positive for rash. Negative for color change.  Neurological:  Negative for speech difficulty, weakness and headaches.  Psychiatric/Behavioral:  Positive for sleep disturbance. Negative for behavioral problems. The patient is not nervous/anxious.        Memory lapses.     Health Maintenance  Topic Date Due   DTaP/Tdap/Td (2 - Td or Tdap) 12/15/2021   COVID-19 Vaccine (4 - 2023-24 season) 03/12/2023   Zoster Vaccines- Shingrix (1 of 2) 02/09/2024 (Originally 10/04/1971)   HEMOGLOBIN A1C  11/27/2023   OPHTHALMOLOGY EXAM  12/13/2023   FOOT EXAM  03/01/2024   Medicare Annual Wellness (AWV)  03/15/2024   Pneumonia Vaccine 31+ Years old  Completed   INFLUENZA VACCINE  Completed   DEXA SCAN  Completed   HPV VACCINES  Aged Out    Physical Exam: Vitals:   06/01/23 1406  BP: 124/72  Pulse: 90  Resp: 18  Temp: (!) 97.2 F (36.2 C)  SpO2: 95%  Weight: 169 lb 6.4 oz (76.8 kg)  Height: 5\' 3"  (1.6 m)   Body mass index is 30.01 kg/m. Physical Exam Vitals and nursing note reviewed.  Constitutional:      Appearance: Normal appearance.  HENT:     Head: Normocephalic and atraumatic.     Nose: Nose normal.     Mouth/Throat:     Mouth: Mucous membranes are moist.  Eyes:     Extraocular Movements: Extraocular movements intact.     Conjunctiva/sclera: Conjunctivae normal.     Pupils: Pupils are equal, round, and reactive to light.   Cardiovascular:     Rate and Rhythm: Normal rate and regular rhythm.     Heart sounds: No murmur heard. Pulmonary:  Effort: Pulmonary effort is normal.     Breath sounds: Rales present. No rhonchi.     Comments: Bibasilar  Abdominal:     General: Bowel sounds are normal.     Palpations: Abdomen is soft.     Tenderness: There is no abdominal tenderness.  Musculoskeletal:     Cervical back: Normal range of motion and neck supple.     Right lower leg: No edema.     Left lower leg: No edema.  Skin:    General: Skin is warm and dry.     Findings: Rash present.     Comments: R+L shin small red papules itching, Vaseline no longer effective. Upper left buttock cyst about a golf ball seized, no pain. F/u dermatology.   Neurological:     Mental Status: She is alert. Mental status is at baseline.     Gait: Gait abnormal.     Comments: Oriented to person, place.   Psychiatric:        Mood and Affect: Mood normal.        Behavior: Behavior normal.        Thought Content: Thought content normal.     Labs reviewed: Basic Metabolic Panel: Recent Labs    07/15/22 1038 10/25/22 0700 05/30/23 0739  NA 136 138 139  K 4.2 4.1 4.2  CL 102 104 101  CO2 21 25 26   GLUCOSE 171* 124* 164*  BUN 18 18 15   CREATININE 0.87 0.73 0.84  CALCIUM 9.1 9.2 10.1  TSH  --  3.05  --    Liver Function Tests: Recent Labs    07/15/22 1038 10/25/22 0700 05/30/23 0739  AST 16 18 20   ALT 11 14 14   BILITOT 0.6 0.8 0.9  PROT 6.7 6.6 7.4   No results for input(s): "LIPASE", "AMYLASE" in the last 8760 hours. No results for input(s): "AMMONIA" in the last 8760 hours. CBC: Recent Labs    07/15/22 1038 10/25/22 0700 05/30/23 0739  WBC 7.5 8.7 11.5*  NEUTROABS 4,125 3,376 3,772  HGB 13.5 12.9 14.4  HCT 39.1 38.3 42.8  MCV 90.5 90.8 91.5  PLT 305 280 313   Lipid Panel: Recent Labs    05/30/23 0739  CHOL 233*  HDL 63  LDLCALC 126*  TRIG 288*  CHOLHDL 3.7   Lab Results  Component Value  Date   HGBA1C 8.1 (H) 05/30/2023    Procedures since last visit: No results found.  Assessment/Plan  Hypertension blood pressure is controlled on Lisinopril, Atorvastatin, Bun/creat 15/0.84, Ca 10.1 05/30/23  Hyperlipidemia 126 05/30/23, taking Atorvastatin  Depression with anxiety Stable,  prn Trazodone, TSH 3.05 10/25/22  Diabetes mellitus type 2, controlled (HCC)  restart Metformin 500mg  qd, last Hgb a1c 6.5 11/19/20<<7.2 07/15/22<<8.1 05/30/23  Incontinent of urine taking Oxybutynin, c/o  dry eyes, mouth, blurred vision.   Osteoporosis  takes Alendronate. Ca, Vit D. DEXA 12/24/20 t score -2.6. 03/02/23 declined DEXA  Back pain with right-sided sciatica  Lower back pain, right sciatica, comes and goes for over a year.     Labs/tests ordered:  none  Next appt:  3 months 09/15/23

## 2023-06-01 NOTE — Assessment & Plan Note (Signed)
Lower back pain, right sciatica, comes and goes for over a year.  

## 2023-06-01 NOTE — Assessment & Plan Note (Signed)
taking Oxybutynin, c/o  dry eyes, mouth, blurred vision.  

## 2023-06-05 ENCOUNTER — Encounter: Payer: Self-pay | Admitting: Nurse Practitioner

## 2023-06-13 DIAGNOSIS — R2681 Unsteadiness on feet: Secondary | ICD-10-CM | POA: Diagnosis not present

## 2023-06-13 DIAGNOSIS — M6281 Muscle weakness (generalized): Secondary | ICD-10-CM | POA: Diagnosis not present

## 2023-06-13 DIAGNOSIS — R41841 Cognitive communication deficit: Secondary | ICD-10-CM | POA: Diagnosis not present

## 2023-06-13 DIAGNOSIS — R296 Repeated falls: Secondary | ICD-10-CM | POA: Diagnosis not present

## 2023-06-14 DIAGNOSIS — R296 Repeated falls: Secondary | ICD-10-CM | POA: Diagnosis not present

## 2023-06-14 DIAGNOSIS — R41841 Cognitive communication deficit: Secondary | ICD-10-CM | POA: Diagnosis not present

## 2023-06-14 DIAGNOSIS — R2681 Unsteadiness on feet: Secondary | ICD-10-CM | POA: Diagnosis not present

## 2023-06-14 DIAGNOSIS — M6281 Muscle weakness (generalized): Secondary | ICD-10-CM | POA: Diagnosis not present

## 2023-06-16 DIAGNOSIS — M6281 Muscle weakness (generalized): Secondary | ICD-10-CM | POA: Diagnosis not present

## 2023-06-16 DIAGNOSIS — R296 Repeated falls: Secondary | ICD-10-CM | POA: Diagnosis not present

## 2023-06-16 DIAGNOSIS — R41841 Cognitive communication deficit: Secondary | ICD-10-CM | POA: Diagnosis not present

## 2023-06-16 DIAGNOSIS — R2681 Unsteadiness on feet: Secondary | ICD-10-CM | POA: Diagnosis not present

## 2023-06-19 DIAGNOSIS — R2681 Unsteadiness on feet: Secondary | ICD-10-CM | POA: Diagnosis not present

## 2023-06-19 DIAGNOSIS — R41841 Cognitive communication deficit: Secondary | ICD-10-CM | POA: Diagnosis not present

## 2023-06-19 DIAGNOSIS — M6281 Muscle weakness (generalized): Secondary | ICD-10-CM | POA: Diagnosis not present

## 2023-06-19 DIAGNOSIS — R296 Repeated falls: Secondary | ICD-10-CM | POA: Diagnosis not present

## 2023-06-19 DIAGNOSIS — H353211 Exudative age-related macular degeneration, right eye, with active choroidal neovascularization: Secondary | ICD-10-CM | POA: Diagnosis not present

## 2023-06-20 DIAGNOSIS — R2681 Unsteadiness on feet: Secondary | ICD-10-CM | POA: Diagnosis not present

## 2023-06-20 DIAGNOSIS — R41841 Cognitive communication deficit: Secondary | ICD-10-CM | POA: Diagnosis not present

## 2023-06-20 DIAGNOSIS — M6281 Muscle weakness (generalized): Secondary | ICD-10-CM | POA: Diagnosis not present

## 2023-06-20 DIAGNOSIS — R296 Repeated falls: Secondary | ICD-10-CM | POA: Diagnosis not present

## 2023-06-21 DIAGNOSIS — M6281 Muscle weakness (generalized): Secondary | ICD-10-CM | POA: Diagnosis not present

## 2023-06-21 DIAGNOSIS — R41841 Cognitive communication deficit: Secondary | ICD-10-CM | POA: Diagnosis not present

## 2023-06-21 DIAGNOSIS — R296 Repeated falls: Secondary | ICD-10-CM | POA: Diagnosis not present

## 2023-06-21 DIAGNOSIS — R2681 Unsteadiness on feet: Secondary | ICD-10-CM | POA: Diagnosis not present

## 2023-06-22 DIAGNOSIS — R296 Repeated falls: Secondary | ICD-10-CM | POA: Diagnosis not present

## 2023-06-22 DIAGNOSIS — R2681 Unsteadiness on feet: Secondary | ICD-10-CM | POA: Diagnosis not present

## 2023-06-22 DIAGNOSIS — M6281 Muscle weakness (generalized): Secondary | ICD-10-CM | POA: Diagnosis not present

## 2023-06-22 DIAGNOSIS — R41841 Cognitive communication deficit: Secondary | ICD-10-CM | POA: Diagnosis not present

## 2023-06-23 DIAGNOSIS — R296 Repeated falls: Secondary | ICD-10-CM | POA: Diagnosis not present

## 2023-06-23 DIAGNOSIS — M6281 Muscle weakness (generalized): Secondary | ICD-10-CM | POA: Diagnosis not present

## 2023-06-23 DIAGNOSIS — R41841 Cognitive communication deficit: Secondary | ICD-10-CM | POA: Diagnosis not present

## 2023-06-23 DIAGNOSIS — R2681 Unsteadiness on feet: Secondary | ICD-10-CM | POA: Diagnosis not present

## 2023-06-26 DIAGNOSIS — R41841 Cognitive communication deficit: Secondary | ICD-10-CM | POA: Diagnosis not present

## 2023-06-26 DIAGNOSIS — M6281 Muscle weakness (generalized): Secondary | ICD-10-CM | POA: Diagnosis not present

## 2023-06-26 DIAGNOSIS — R2681 Unsteadiness on feet: Secondary | ICD-10-CM | POA: Diagnosis not present

## 2023-06-26 DIAGNOSIS — R296 Repeated falls: Secondary | ICD-10-CM | POA: Diagnosis not present

## 2023-06-27 DIAGNOSIS — M6281 Muscle weakness (generalized): Secondary | ICD-10-CM | POA: Diagnosis not present

## 2023-06-27 DIAGNOSIS — R41841 Cognitive communication deficit: Secondary | ICD-10-CM | POA: Diagnosis not present

## 2023-06-27 DIAGNOSIS — R296 Repeated falls: Secondary | ICD-10-CM | POA: Diagnosis not present

## 2023-06-27 DIAGNOSIS — R2681 Unsteadiness on feet: Secondary | ICD-10-CM | POA: Diagnosis not present

## 2023-06-29 ENCOUNTER — Non-Acute Institutional Stay: Payer: Medicare Other | Admitting: Nurse Practitioner

## 2023-06-29 ENCOUNTER — Encounter: Payer: Self-pay | Admitting: Nurse Practitioner

## 2023-06-29 VITALS — BP 126/72 | HR 100 | Temp 97.7°F | Resp 18 | Ht 63.0 in | Wt 164.6 lb

## 2023-06-29 DIAGNOSIS — K2900 Acute gastritis without bleeding: Secondary | ICD-10-CM

## 2023-06-29 DIAGNOSIS — M810 Age-related osteoporosis without current pathological fracture: Secondary | ICD-10-CM | POA: Diagnosis not present

## 2023-06-29 DIAGNOSIS — R252 Cramp and spasm: Secondary | ICD-10-CM

## 2023-06-29 DIAGNOSIS — F418 Other specified anxiety disorders: Secondary | ICD-10-CM

## 2023-06-29 DIAGNOSIS — M5431 Sciatica, right side: Secondary | ICD-10-CM | POA: Diagnosis not present

## 2023-06-29 DIAGNOSIS — N3942 Incontinence without sensory awareness: Secondary | ICD-10-CM | POA: Diagnosis not present

## 2023-06-29 DIAGNOSIS — I10 Essential (primary) hypertension: Secondary | ICD-10-CM | POA: Diagnosis not present

## 2023-06-29 DIAGNOSIS — E1121 Type 2 diabetes mellitus with diabetic nephropathy: Secondary | ICD-10-CM | POA: Diagnosis not present

## 2023-06-29 DIAGNOSIS — E785 Hyperlipidemia, unspecified: Secondary | ICD-10-CM | POA: Diagnosis not present

## 2023-06-29 NOTE — Progress Notes (Signed)
Location:   [facility]inic [facility]   Place of Service:  Clinic (12) Provider: [facility] Melvie Paglia NP  Philander Ake X, NP  Patient Care Team: Earla Charlie X, NP as PCP - General (Internal Medicine) Jayen Bromwell X, NP as Nurse Practitioner (Internal Medicine)  Extended Emergency Contact Information Primary Emergency Contact: Hilbert Corrigan [place] Home Phone: [phone] Relation: Niece  Code Status: DNR Goals of care: Advanced Directive information    06/29/2023    1:59 PM  Advanced Directives  Does Patient Have a Medical Advance Directive? No  Would patient like information on creating a medical advance directive? No - Patient declined     Chief Complaint  Patient presents with   Acute Visit    Congestion,not feeling well.    HPI:  Pt is a 89 y.o. female seen today for an acute visit for diarrhea, not feeling well, denied fever, abd pain, nausea, or vomiting, able to consume adequate lipids/food.    Muscle cramps, better,  Labs unremarkable, may consider Alprazolam or Methocarbamol              HTN, blood pressure is controlled on Lisinopril, Atorvastatin, Bun/creat 15/0.84, Ca 10.1 05/30/23             LDL 126 05/30/23, taking Atorvastatin             Her mood has no change, prn Trazodone, TSH 3.05 10/25/22             T2DM, restart Metformin 500mg  qd, last Hgb a1c 6.5 11/19/20<<7.2 07/15/22<<8.1 05/30/23             Urinary frequency, taking Oxybutynin, c/o  dry eyes, mouth, blurred vision.              OP takes Alendronate. Ca, Vit D. DEXA 12/24/20 t score -2.6, 03/02/23 declined DEXA             Lower back pain, right sciatica, comes and goes for over a year.        Past Medical History:  Diagnosis Date   Allergy    Arthritis    Diabetes mellitus    Hernia, abdominal    Hyperlipidemia    Hypertension    Osteopenia, senile 09/26/2012   T score - 1.6 Left femur   Past Surgical History:  Procedure Laterality Date   APPENDECTOMY     BACK SURGERY     1965   EYE SURGERY      GUM SURGERY     KNEE SURGERY      No Known Allergies  Allergies as of 06/29/2023   No Known Allergies      Medication List        Accurate as of June 29, 2023  4:06 PM. If you have any questions, ask your nurse or doctor.          ascorbic acid 500 MG tablet Commonly known as: VITAMIN C Take 500 mg by mouth daily.   atorvastatin 10 MG tablet Commonly known as: LIPITOR TAKE ONE TABLET BY MOUTH ONCE DAILY   Biotene Moisturizing Mouth Soln Use as directed 5 sprays in the mouth or throat 3 (three) times daily as needed.   Calcium Carbonate-Vitamin D3 600-400 MG-UNIT Tabs Take 1 tablet by mouth 2 (two) times daily.   fish oil-omega-3 fatty acids 1000 MG capsule Take 2 g by mouth daily.   Flax Oil-Fish Oil-Borage Oil Caps Take by mouth daily.   guaiFENesin 600 MG 12 hr tablet Commonly  known as: Mucinex Take 1 tablet (600 mg total) by mouth 2 (two) times daily.   hydrocortisone 2.5%-Eucerin equivalent 1:1 cream mixture Apply topically 2 (two) times daily.   lisinopril 10 MG tablet Commonly known as: ZESTRIL TAKE ONE TABLET BY MOUTH DAILY   magnesium oxide 400 MG tablet Commonly known as: MAG-OX Take 400 mg by mouth daily.   metFORMIN 500 MG tablet Commonly known as: GLUCOPHAGE Take 1 tablet (500 mg total) by mouth daily with breakfast.   methocarbamol 500 MG tablet Commonly known as: ROBAXIN Take 0.5 tablets (250 mg total) by mouth every 8 (eight) hours as needed for muscle spasms.   omeprazole 20 MG capsule Commonly known as: PRILOSEC Take 1 capsule (20 mg total) by mouth daily.   oxybutynin 10 MG 24 hr tablet Commonly known as: DITROPAN-XL TAKE ONE TABLET AT BEDTIME.   PRESERVISION AREDS 2+MULTI VIT PO Take 1 tablet by mouth 2 (two) times daily.   traZODone 50 MG tablet Commonly known as: DESYREL Take 0.5 tablets (25 mg total) by mouth at bedtime as needed for sleep.   triamcinolone ointment 0.5 % Commonly known as: KENALOG Apply 1  Application topically 2 (two) times daily.   Vitamin B-12 5000 MCG Subl Place under the tongue daily.   Vitamin D3 50 MCG (2000 UT) Tabs Take 1 tablet by mouth daily.   vitamin E 45 MG (100 UNITS) capsule Take by mouth daily.        Review of Systems  Constitutional:  Positive for fatigue. Negative for appetite change and fever.  HENT:  Positive for hearing loss. Negative for congestion and sore throat.   Eyes:  Negative for visual disturbance.  Respiratory:  Positive for cough. Negative for shortness of breath and wheezing.   Cardiovascular:  Negative for leg swelling.  Gastrointestinal:  Positive for diarrhea. Negative for abdominal pain, nausea and vomiting.       Acid reflux, belching at times, mucous collected in the back of mouth/throat.   Genitourinary:  Positive for frequency. Negative for dysuria, hematuria and urgency.       Average 6-7x/night.   Musculoskeletal:  Positive for arthralgias, back pain and gait problem.       Right sciatica. Walks with walker and cane. Left buttock and leg cramps occasionally when getting out of bed  Skin:  Negative for color change.  Neurological:  Negative for speech difficulty, weakness and headaches.  Psychiatric/Behavioral:  Positive for sleep disturbance. Negative for behavioral problems. The patient is not nervous/anxious.        Memory lapses.     Immunization History  Administered Date(s) Administered   Influenza Split 04/13/2012   Influenza Whole 04/12/2018   Influenza, High Dose Seasonal PF 04/24/2019, 04/22/2020, 05/10/2023   Influenza,inj,Quad PF,6+ Mos 06/14/2013, 04/14/2014, 04/27/2015   Influenza-Unspecified 04/10/2016   Moderna Sars-Covid-2 Vaccination 07/15/2019, 08/12/2019, 05/19/2020   PPD Test 12/16/2011, 12/16/2011   Pneumococcal Conjugate-13 07/14/2014   Pneumococcal Polysaccharide-23 01/04/2010   Tdap 12/16/2011   Pertinent  Health Maintenance Due  Topic Date Due   HEMOGLOBIN A1C  11/27/2023    OPHTHALMOLOGY EXAM  12/13/2023   FOOT EXAM  03/01/2024   INFLUENZA VACCINE  Completed   DEXA SCAN  Completed      10/27/2022    1:53 PM 03/02/2023    2:33 PM 03/16/2023    1:33 PM 06/01/2023    2:00 PM 06/29/2023    1:58 PM  Fall Risk  Falls in the past year? 0 0 0 0 0  Was there an injury with Fall? 0 0 0 0 0  Fall Risk Category Calculator 0 0 0 0 0  Patient at Risk for Falls Due to No Fall Risks No Fall Risks No Fall Risks    Fall risk Follow up Falls evaluation completed Falls evaluation completed Falls evaluation completed     Functional Status Survey:    Vitals:   06/29/23 1408  BP: 126/72  Pulse: 100  Resp: 18  Temp: 97.7 F (36.5 C)  SpO2: 94%  Weight: 164 lb 9.6 oz (74.7 kg)  Height: 5\' 3"  (1.6 m)   Body mass index is 29.16 kg/m. Physical Exam Vitals and nursing note reviewed.  Constitutional:      Comments: fatigue  HENT:     Head: Normocephalic and atraumatic.     Nose: Congestion and rhinorrhea present.     Mouth/Throat:     Mouth: Mucous membranes are moist.     Pharynx: No posterior oropharyngeal erythema.  Eyes:     Extraocular Movements: Extraocular movements intact.     Conjunctiva/sclera: Conjunctivae normal.     Pupils: Pupils are equal, round, and reactive to light.  Cardiovascular:     Rate and Rhythm: Normal rate and regular rhythm.     Heart sounds: No murmur heard. Pulmonary:     Effort: Pulmonary effort is normal.     Breath sounds: Rales present. No rhonchi.     Comments: Bibasilar  Abdominal:     General: Bowel sounds are normal. There is no distension.     Palpations: Abdomen is soft.     Tenderness: There is no abdominal tenderness. There is no right CVA tenderness, left CVA tenderness or guarding.     Comments: Umbilical hernia.   Musculoskeletal:     Cervical back: Normal range of motion and neck supple.     Right lower leg: No edema.     Left lower leg: No edema.  Skin:    General: Skin is warm and dry.     Comments: Upper  left buttock cyst about a golf ball seized, no pain. F/u dermatology.   Neurological:     Mental Status: She is alert. Mental status is at baseline.     Gait: Gait abnormal.     Comments: Oriented to person, place.   Psychiatric:        Mood and Affect: Mood normal.        Behavior: Behavior normal.        Thought Content: Thought content normal.     Labs reviewed: Recent Labs    07/15/22 1038 10/25/22 0700 05/30/23 0739  NA 136 138 139  K 4.2 4.1 4.2  CL 102 104 101  CO2 21 25 26   GLUCOSE 171* 124* 164*  BUN 18 18 15   CREATININE 0.87 0.73 0.84  CALCIUM 9.1 9.2 10.1   Recent Labs    07/15/22 1038 10/25/22 0700 05/30/23 0739  AST 16 18 20   ALT 11 14 14   BILITOT 0.6 0.8 0.9  PROT 6.7 6.6 7.4   Recent Labs    07/15/22 1038 10/25/22 0700 05/30/23 0739  WBC 7.5 8.7 11.5*  NEUTROABS 4,125 3,376 3,772  HGB 13.5 12.9 14.4  HCT 39.1 38.3 42.8  MCV 90.5 90.8 91.5  PLT 305 280 313   Lab Results  Component Value Date   TSH 3.05 10/25/2022   Lab Results  Component Value Date   HGBA1C 8.1 (H) 05/30/2023   Lab Results  Component Value Date  CHOL 233 (H) 05/30/2023   HDL 63 05/30/2023   LDLCALC 126 (H) 05/30/2023   LDLDIRECT 133 (H) 07/14/2014   TRIG 288 (H) 05/30/2023   CHOLHDL 3.7 05/30/2023    Significant Diagnostic Results in last 30 days:  No results found.  Assessment/Plan: Muscle cramps better,  Labs unremarkable, may consider Alprazolam or Methocarbamol   Hypertension blood pressure is controlled on Lisinopril, Atorvastatin, Bun/creat 15/0.84, Ca 10.1 05/30/23  Hyperlipidemia     LDL 126 05/30/23, taking Atorvastatin  Depression with anxiety Her mood has no change, prn Trazodone, TSH 3.05 10/25/22  Diabetes mellitus type 2, controlled (HCC)  restart Metformin 500mg  qd, last Hgb a1c 6.5 11/19/20<<7.2 07/15/22<<8.1 05/30/23  Incontinent of urine  taking Oxybutynin, c/o  dry eyes, mouth, blurred vision.   Osteoporosis  takes Alendronate. Ca,  Vit D. DEXA 12/24/20 t score -2.6, 03/02/23 declined DEXA  Back pain with right-sided sciatica  Lower back pain, right sciatica, comes and goes for over a year.   Acute gastritis diarrhea, not feeling well, denied fever, abd pain, nausea, or vomiting, able to consume adequate lipids/food.  Encourage fluid intake Declined acid reducer or Imodium CBC/diff, CMP/eGFR    Family/ staff Communication: plan of care reviewed with the patient and charge nurse.   Labs/tests ordered:  CBC/diff, CMP/eGFR 07/06/23

## 2023-06-29 NOTE — Assessment & Plan Note (Signed)
 blood pressure is controlled on Lisinopril, Atorvastatin, Bun/creat 15/0.84, Ca 10.1 05/30/23

## 2023-06-29 NOTE — Assessment & Plan Note (Signed)
better,  Labs unremarkable, may consider Alprazolam or Methocarbamol

## 2023-06-29 NOTE — Assessment & Plan Note (Signed)
 restart Metformin 500mg  qd, last Hgb a1c 6.5 11/19/20<<7.2 07/15/22<<8.1 05/30/23

## 2023-06-29 NOTE — Assessment & Plan Note (Signed)
takes Alendronate. Ca, Vit D. DEXA 12/24/20 t score -2.6, 03/02/23 declined DEXA

## 2023-06-29 NOTE — Assessment & Plan Note (Signed)
LDL 126 05/30/23, taking Atorvastatin

## 2023-06-29 NOTE — Assessment & Plan Note (Signed)
Lower back pain, right sciatica, comes and goes for over a year.  

## 2023-06-29 NOTE — Assessment & Plan Note (Signed)
taking Oxybutynin, c/o  dry eyes, mouth, blurred vision.  

## 2023-06-29 NOTE — Assessment & Plan Note (Deleted)
Congestion,

## 2023-06-29 NOTE — Assessment & Plan Note (Signed)
diarrhea, not feeling well, denied fever, abd pain, nausea, or vomiting, able to consume adequate lipids/food.  Encourage fluid intake Declined acid reducer or Imodium CBC/diff, CMP/eGFR

## 2023-06-29 NOTE — Assessment & Plan Note (Signed)
Her mood has no change, prn Trazodone, TSH 3.05 10/25/22

## 2023-07-03 DIAGNOSIS — R41841 Cognitive communication deficit: Secondary | ICD-10-CM | POA: Diagnosis not present

## 2023-07-03 DIAGNOSIS — R2681 Unsteadiness on feet: Secondary | ICD-10-CM | POA: Diagnosis not present

## 2023-07-03 DIAGNOSIS — M6281 Muscle weakness (generalized): Secondary | ICD-10-CM | POA: Diagnosis not present

## 2023-07-03 DIAGNOSIS — R296 Repeated falls: Secondary | ICD-10-CM | POA: Diagnosis not present

## 2023-07-04 DIAGNOSIS — R2681 Unsteadiness on feet: Secondary | ICD-10-CM | POA: Diagnosis not present

## 2023-07-04 DIAGNOSIS — R296 Repeated falls: Secondary | ICD-10-CM | POA: Diagnosis not present

## 2023-07-04 DIAGNOSIS — R41841 Cognitive communication deficit: Secondary | ICD-10-CM | POA: Diagnosis not present

## 2023-07-04 DIAGNOSIS — M6281 Muscle weakness (generalized): Secondary | ICD-10-CM | POA: Diagnosis not present

## 2023-07-06 ENCOUNTER — Other Ambulatory Visit: Payer: Medicare Other

## 2023-07-06 DIAGNOSIS — R296 Repeated falls: Secondary | ICD-10-CM | POA: Diagnosis not present

## 2023-07-06 DIAGNOSIS — R41841 Cognitive communication deficit: Secondary | ICD-10-CM | POA: Diagnosis not present

## 2023-07-06 DIAGNOSIS — R2681 Unsteadiness on feet: Secondary | ICD-10-CM | POA: Diagnosis not present

## 2023-07-06 DIAGNOSIS — M6281 Muscle weakness (generalized): Secondary | ICD-10-CM | POA: Diagnosis not present

## 2023-07-07 DIAGNOSIS — R296 Repeated falls: Secondary | ICD-10-CM | POA: Diagnosis not present

## 2023-07-07 DIAGNOSIS — M6281 Muscle weakness (generalized): Secondary | ICD-10-CM | POA: Diagnosis not present

## 2023-07-07 DIAGNOSIS — R41841 Cognitive communication deficit: Secondary | ICD-10-CM | POA: Diagnosis not present

## 2023-07-07 DIAGNOSIS — R2681 Unsteadiness on feet: Secondary | ICD-10-CM | POA: Diagnosis not present

## 2023-07-10 DIAGNOSIS — R2681 Unsteadiness on feet: Secondary | ICD-10-CM | POA: Diagnosis not present

## 2023-07-10 DIAGNOSIS — R296 Repeated falls: Secondary | ICD-10-CM | POA: Diagnosis not present

## 2023-07-10 DIAGNOSIS — R41841 Cognitive communication deficit: Secondary | ICD-10-CM | POA: Diagnosis not present

## 2023-07-10 DIAGNOSIS — M6281 Muscle weakness (generalized): Secondary | ICD-10-CM | POA: Diagnosis not present

## 2023-07-11 DIAGNOSIS — M6281 Muscle weakness (generalized): Secondary | ICD-10-CM | POA: Diagnosis not present

## 2023-07-11 DIAGNOSIS — R296 Repeated falls: Secondary | ICD-10-CM | POA: Diagnosis not present

## 2023-07-11 DIAGNOSIS — R2681 Unsteadiness on feet: Secondary | ICD-10-CM | POA: Diagnosis not present

## 2023-07-11 DIAGNOSIS — R41841 Cognitive communication deficit: Secondary | ICD-10-CM | POA: Diagnosis not present

## 2023-07-12 DIAGNOSIS — M6281 Muscle weakness (generalized): Secondary | ICD-10-CM | POA: Diagnosis not present

## 2023-07-12 DIAGNOSIS — R296 Repeated falls: Secondary | ICD-10-CM | POA: Diagnosis not present

## 2023-07-12 DIAGNOSIS — R41841 Cognitive communication deficit: Secondary | ICD-10-CM | POA: Diagnosis not present

## 2023-07-12 DIAGNOSIS — R2681 Unsteadiness on feet: Secondary | ICD-10-CM | POA: Diagnosis not present

## 2023-07-13 ENCOUNTER — Encounter: Payer: Self-pay | Admitting: Nurse Practitioner

## 2023-07-13 ENCOUNTER — Other Ambulatory Visit: Payer: Medicare Other

## 2023-07-13 ENCOUNTER — Non-Acute Institutional Stay: Payer: Medicare Other | Admitting: Nurse Practitioner

## 2023-07-13 VITALS — BP 130/80 | HR 96 | Temp 97.9°F | Resp 17 | Ht 63.0 in | Wt 164.8 lb

## 2023-07-13 DIAGNOSIS — I10 Essential (primary) hypertension: Secondary | ICD-10-CM

## 2023-07-13 DIAGNOSIS — H6121 Impacted cerumen, right ear: Secondary | ICD-10-CM | POA: Diagnosis not present

## 2023-07-13 NOTE — Assessment & Plan Note (Signed)
 Ear lavage, impacted cerumen removed.

## 2023-07-13 NOTE — Progress Notes (Signed)
 Location:   clinic [facility]   Place of Service:  Clinic (12) Provider: Larwance Hark NP  Code Status: DNR Goals of Care:     07/13/2023    1:26 PM  Advanced Directives  Does Patient Have a Medical Advance Directive? No  Would patient like information on creating a medical advance directive? No - Patient declined     No chief complaint on file.   HPI: Patient is a 89 y.o. female seen today for ear lavage.   Impacted cerumen R ear  Muscle cramps, better,  Labs unremarkable, may consider Alprazolam or Methocarbamol               HTN, blood pressure is controlled on Lisinopril , Atorvastatin , Bun/creat 15/0.84, Ca 10.1 05/30/23             LDL 126 05/30/23, taking Atorvastatin              Her mood has no change, prn Trazodone , TSH 3.05 10/25/22             T2DM, restart Metformin  500mg  qd, last Hgb a1c 6.5 11/19/20<<7.2 07/15/22<<8.1 05/30/23             Urinary frequency, taking Oxybutynin , c/o  dry eyes, mouth, blurred vision.              OP takes Alendronate . Ca, Vit D. DEXA 12/24/20 t score -2.6, 03/02/23 declined DEXA             Lower back pain, right sciatica, comes and goes for over a year.        Past Medical History:  Diagnosis Date   Allergy    Arthritis    Diabetes mellitus    Hernia, abdominal    Hyperlipidemia    Hypertension    Osteopenia, senile 09/26/2012   T score - 1.6 Left femur    Past Surgical History:  Procedure Laterality Date   APPENDECTOMY     BACK SURGERY     1965   EYE SURGERY     GUM SURGERY     KNEE SURGERY      No Known Allergies  Allergies as of 07/13/2023   No Known Allergies      Medication List        Accurate as of July 13, 2023 11:59 PM. If you have any questions, ask your nurse or doctor.          ascorbic acid 500 MG tablet Commonly known as: VITAMIN C Take 500 mg by mouth daily.   atorvastatin  10 MG tablet Commonly known as: LIPITOR TAKE ONE TABLET BY MOUTH ONCE DAILY   Biotene Moisturizing Mouth Soln Use as  directed 5 sprays in the mouth or throat 3 (three) times daily as needed.   Calcium  Carbonate-Vitamin D3 600-400 MG-UNIT Tabs Take 1 tablet by mouth 2 (two) times daily.   fish oil-omega-3 fatty acids 1000 MG capsule Take 2 g by mouth daily.   Flax Oil-Fish Oil-Borage Oil Caps Take by mouth daily.   guaiFENesin  600 MG 12 hr tablet Commonly known as: Mucinex  Take 1 tablet (600 mg total) by mouth 2 (two) times daily.   hydrocortisone  2.5%-Eucerin equivalent 1:1 cream mixture Apply topically 2 (two) times daily.   lisinopril  10 MG tablet Commonly known as: ZESTRIL  TAKE ONE TABLET BY MOUTH DAILY   magnesium oxide 400 MG tablet Commonly known as: MAG-OX Take 400 mg by mouth daily.   metFORMIN  500 MG tablet Commonly known as: GLUCOPHAGE  Take 1 tablet (  500 mg total) by mouth daily with breakfast.   methocarbamol  500 MG tablet Commonly known as: ROBAXIN  Take 0.5 tablets (250 mg total) by mouth every 8 (eight) hours as needed for muscle spasms.   omeprazole  20 MG capsule Commonly known as: PRILOSEC Take 1 capsule (20 mg total) by mouth daily.   oxybutynin  10 MG 24 hr tablet Commonly known as: DITROPAN -XL TAKE ONE TABLET AT BEDTIME.   PRESERVISION AREDS 2+MULTI VIT PO Take 1 tablet by mouth 2 (two) times daily.   traZODone  50 MG tablet Commonly known as: DESYREL  Take 0.5 tablets (25 mg total) by mouth at bedtime as needed for sleep.   triamcinolone  ointment 0.5 % Commonly known as: KENALOG  Apply 1 Application topically 2 (two) times daily.   Vitamin B-12 5000 MCG Subl Place under the tongue daily.   Vitamin D3 50 MCG (2000 UT) Tabs Take 1 tablet by mouth daily.   vitamin E 45 MG (100 UNITS) capsule Take by mouth daily.        Review of Systems:  Review of Systems  Constitutional:  Negative for appetite change, fatigue and fever.  HENT:  Positive for hearing loss. Negative for congestion and sore throat.   Eyes:  Negative for visual disturbance.   Respiratory:  Positive for cough. Negative for shortness of breath and wheezing.   Cardiovascular:  Negative for leg swelling.  Gastrointestinal:  Negative for abdominal pain, diarrhea, nausea and vomiting.       Acid reflux, belching at times, mucous collected in the back of mouth/throat.   Genitourinary:  Positive for frequency. Negative for dysuria, hematuria and urgency.       Average 6-7x/night.   Musculoskeletal:  Positive for arthralgias, back pain and gait problem.       Right sciatica. Walks with walker and cane. Left buttock and leg cramps occasionally when getting out of bed  Skin:  Negative for color change.  Neurological:  Negative for speech difficulty, weakness and headaches.  Psychiatric/Behavioral:  Positive for sleep disturbance. Negative for behavioral problems. The patient is not nervous/anxious.        Memory lapses.     Health Maintenance  Topic Date Due   DTaP/Tdap/Td (2 - Td or Tdap) 12/15/2021   COVID-19 Vaccine (4 - 2024-25 season) 03/12/2023   Zoster Vaccines- Shingrix (1 of 2) 02/09/2024 (Originally 10/04/1971)   HEMOGLOBIN A1C  11/27/2023   OPHTHALMOLOGY EXAM  12/13/2023   FOOT EXAM  03/01/2024   Medicare Annual Wellness (AWV)  03/15/2024   Pneumonia Vaccine 67+ Years old  Completed   INFLUENZA VACCINE  Completed   DEXA SCAN  Completed   HPV VACCINES  Aged Out    Physical Exam: Vitals:   07/13/23 1327  BP: 130/80  Pulse: 96  Resp: 17  Temp: 97.9 F (36.6 C)  SpO2: 96%  Weight: 164 lb 12.8 oz (74.8 kg)  Height: 5' 3 (1.6 m)   Body mass index is 29.19 kg/m. Physical Exam Vitals and nursing note reviewed.  Constitutional:      Appearance: Normal appearance.  HENT:     Head: Normocephalic and atraumatic.     Right Ear: Tympanic membrane normal.     Left Ear: Tympanic membrane normal.     Ears:     Comments: R ear impacted cerumen removed with ear lavage.     Nose: Nose normal. No congestion or rhinorrhea.     Mouth/Throat:     Mouth:  Mucous membranes are moist.  Eyes:  Extraocular Movements: Extraocular movements intact.     Conjunctiva/sclera: Conjunctivae normal.     Pupils: Pupils are equal, round, and reactive to light.  Cardiovascular:     Rate and Rhythm: Normal rate and regular rhythm.     Heart sounds: No murmur heard. Pulmonary:     Effort: Pulmonary effort is normal.     Breath sounds: Rales present. No rhonchi.     Comments: Bibasilar  Abdominal:     General: Bowel sounds are normal. There is no distension.     Palpations: Abdomen is soft.     Tenderness: There is no abdominal tenderness. There is no right CVA tenderness, left CVA tenderness or guarding.     Comments: Umbilical hernia.   Musculoskeletal:     Cervical back: Normal range of motion and neck supple.     Right lower leg: No edema.     Left lower leg: No edema.  Skin:    General: Skin is warm and dry.     Comments: Upper left buttock cyst about a golf ball seized, no pain. F/u dermatology.   Neurological:     Mental Status: She is alert. Mental status is at baseline.     Gait: Gait abnormal.     Comments: Oriented to person, place.   Psychiatric:        Mood and Affect: Mood normal.        Behavior: Behavior normal.        Thought Content: Thought content normal.     Labs reviewed: Basic Metabolic Panel: Recent Labs    07/15/22 1038 10/25/22 0700 05/30/23 0739  NA 136 138 139  K 4.2 4.1 4.2  CL 102 104 101  CO2 21 25 26   GLUCOSE 171* 124* 164*  BUN 18 18 15   CREATININE 0.87 0.73 0.84  CALCIUM  9.1 9.2 10.1  TSH  --  3.05  --    Liver Function Tests: Recent Labs    07/15/22 1038 10/25/22 0700 05/30/23 0739  AST 16 18 20   ALT 11 14 14   BILITOT 0.6 0.8 0.9  PROT 6.7 6.6 7.4   No results for input(s): LIPASE, AMYLASE in the last 8760 hours. No results for input(s): AMMONIA in the last 8760 hours. CBC: Recent Labs    07/15/22 1038 10/25/22 0700 05/30/23 0739  WBC 7.5 8.7 11.5*  NEUTROABS 4,125 3,376  3,772  HGB 13.5 12.9 14.4  HCT 39.1 38.3 42.8  MCV 90.5 90.8 91.5  PLT 305 280 313   Lipid Panel: Recent Labs    05/30/23 0739  CHOL 233*  HDL 63  LDLCALC 126*  TRIG 288*  CHOLHDL 3.7   Lab Results  Component Value Date   HGBA1C 8.1 (H) 05/30/2023    Procedures since last visit: No results found.  Assessment/Plan  Impacted cerumen of right ear Ear lavage, impacted cerumen removed.   Hypertension blood pressure is controlled on Lisinopril , Atorvastatin , Bun/creat 15/0.84, Ca 10.1 05/30/23  Diabetes mellitus type 2, controlled (HCC)  restart Metformin  500mg  qd, last Hgb a1c 6.5 11/19/20<<7.2 07/15/22<<8.1 05/30/23   Labs/tests ordered:  none  Next appt:  09/07/2023

## 2023-07-13 NOTE — Assessment & Plan Note (Signed)
 blood pressure is controlled on Lisinopril, Atorvastatin, Bun/creat 15/0.84, Ca 10.1 05/30/23

## 2023-07-13 NOTE — Assessment & Plan Note (Signed)
 restart Metformin 500mg  qd, last Hgb a1c 6.5 11/19/20<<7.2 07/15/22<<8.1 05/30/23

## 2023-07-14 ENCOUNTER — Encounter: Payer: Self-pay | Admitting: Nurse Practitioner

## 2023-07-14 DIAGNOSIS — M6281 Muscle weakness (generalized): Secondary | ICD-10-CM | POA: Diagnosis not present

## 2023-07-14 DIAGNOSIS — R2681 Unsteadiness on feet: Secondary | ICD-10-CM | POA: Diagnosis not present

## 2023-07-14 DIAGNOSIS — R296 Repeated falls: Secondary | ICD-10-CM | POA: Diagnosis not present

## 2023-07-14 DIAGNOSIS — R41841 Cognitive communication deficit: Secondary | ICD-10-CM | POA: Diagnosis not present

## 2023-07-17 DIAGNOSIS — R296 Repeated falls: Secondary | ICD-10-CM | POA: Diagnosis not present

## 2023-07-17 DIAGNOSIS — M6281 Muscle weakness (generalized): Secondary | ICD-10-CM | POA: Diagnosis not present

## 2023-07-17 DIAGNOSIS — R2681 Unsteadiness on feet: Secondary | ICD-10-CM | POA: Diagnosis not present

## 2023-07-17 DIAGNOSIS — R41841 Cognitive communication deficit: Secondary | ICD-10-CM | POA: Diagnosis not present

## 2023-07-18 DIAGNOSIS — M6281 Muscle weakness (generalized): Secondary | ICD-10-CM | POA: Diagnosis not present

## 2023-07-18 DIAGNOSIS — R296 Repeated falls: Secondary | ICD-10-CM | POA: Diagnosis not present

## 2023-07-18 DIAGNOSIS — R41841 Cognitive communication deficit: Secondary | ICD-10-CM | POA: Diagnosis not present

## 2023-07-18 DIAGNOSIS — R2681 Unsteadiness on feet: Secondary | ICD-10-CM | POA: Diagnosis not present

## 2023-07-19 DIAGNOSIS — M6281 Muscle weakness (generalized): Secondary | ICD-10-CM | POA: Diagnosis not present

## 2023-07-19 DIAGNOSIS — R296 Repeated falls: Secondary | ICD-10-CM | POA: Diagnosis not present

## 2023-07-19 DIAGNOSIS — R41841 Cognitive communication deficit: Secondary | ICD-10-CM | POA: Diagnosis not present

## 2023-07-19 DIAGNOSIS — R2681 Unsteadiness on feet: Secondary | ICD-10-CM | POA: Diagnosis not present

## 2023-07-20 ENCOUNTER — Other Ambulatory Visit: Payer: Self-pay | Admitting: Internal Medicine

## 2023-07-20 DIAGNOSIS — H04123 Dry eye syndrome of bilateral lacrimal glands: Secondary | ICD-10-CM | POA: Diagnosis not present

## 2023-07-20 DIAGNOSIS — R296 Repeated falls: Secondary | ICD-10-CM | POA: Diagnosis not present

## 2023-07-20 DIAGNOSIS — R2681 Unsteadiness on feet: Secondary | ICD-10-CM | POA: Diagnosis not present

## 2023-07-20 DIAGNOSIS — R41841 Cognitive communication deficit: Secondary | ICD-10-CM | POA: Diagnosis not present

## 2023-07-20 DIAGNOSIS — M6281 Muscle weakness (generalized): Secondary | ICD-10-CM | POA: Diagnosis not present

## 2023-07-21 DIAGNOSIS — M6281 Muscle weakness (generalized): Secondary | ICD-10-CM | POA: Diagnosis not present

## 2023-07-21 DIAGNOSIS — R41841 Cognitive communication deficit: Secondary | ICD-10-CM | POA: Diagnosis not present

## 2023-07-21 DIAGNOSIS — R296 Repeated falls: Secondary | ICD-10-CM | POA: Diagnosis not present

## 2023-07-21 DIAGNOSIS — R2681 Unsteadiness on feet: Secondary | ICD-10-CM | POA: Diagnosis not present

## 2023-07-24 DIAGNOSIS — R296 Repeated falls: Secondary | ICD-10-CM | POA: Diagnosis not present

## 2023-07-24 DIAGNOSIS — R2681 Unsteadiness on feet: Secondary | ICD-10-CM | POA: Diagnosis not present

## 2023-07-24 DIAGNOSIS — M6281 Muscle weakness (generalized): Secondary | ICD-10-CM | POA: Diagnosis not present

## 2023-07-24 DIAGNOSIS — R41841 Cognitive communication deficit: Secondary | ICD-10-CM | POA: Diagnosis not present

## 2023-07-26 DIAGNOSIS — M6281 Muscle weakness (generalized): Secondary | ICD-10-CM | POA: Diagnosis not present

## 2023-07-26 DIAGNOSIS — R41841 Cognitive communication deficit: Secondary | ICD-10-CM | POA: Diagnosis not present

## 2023-07-26 DIAGNOSIS — R2681 Unsteadiness on feet: Secondary | ICD-10-CM | POA: Diagnosis not present

## 2023-07-26 DIAGNOSIS — R296 Repeated falls: Secondary | ICD-10-CM | POA: Diagnosis not present

## 2023-07-28 DIAGNOSIS — R2681 Unsteadiness on feet: Secondary | ICD-10-CM | POA: Diagnosis not present

## 2023-07-28 DIAGNOSIS — R41841 Cognitive communication deficit: Secondary | ICD-10-CM | POA: Diagnosis not present

## 2023-07-28 DIAGNOSIS — M6281 Muscle weakness (generalized): Secondary | ICD-10-CM | POA: Diagnosis not present

## 2023-07-28 DIAGNOSIS — R296 Repeated falls: Secondary | ICD-10-CM | POA: Diagnosis not present

## 2023-07-31 DIAGNOSIS — M6281 Muscle weakness (generalized): Secondary | ICD-10-CM | POA: Diagnosis not present

## 2023-07-31 DIAGNOSIS — R41841 Cognitive communication deficit: Secondary | ICD-10-CM | POA: Diagnosis not present

## 2023-07-31 DIAGNOSIS — R2681 Unsteadiness on feet: Secondary | ICD-10-CM | POA: Diagnosis not present

## 2023-07-31 DIAGNOSIS — R296 Repeated falls: Secondary | ICD-10-CM | POA: Diagnosis not present

## 2023-08-01 DIAGNOSIS — H353211 Exudative age-related macular degeneration, right eye, with active choroidal neovascularization: Secondary | ICD-10-CM | POA: Diagnosis not present

## 2023-08-02 DIAGNOSIS — R2681 Unsteadiness on feet: Secondary | ICD-10-CM | POA: Diagnosis not present

## 2023-08-02 DIAGNOSIS — R41841 Cognitive communication deficit: Secondary | ICD-10-CM | POA: Diagnosis not present

## 2023-08-02 DIAGNOSIS — M6281 Muscle weakness (generalized): Secondary | ICD-10-CM | POA: Diagnosis not present

## 2023-08-02 DIAGNOSIS — R296 Repeated falls: Secondary | ICD-10-CM | POA: Diagnosis not present

## 2023-08-04 DIAGNOSIS — R41841 Cognitive communication deficit: Secondary | ICD-10-CM | POA: Diagnosis not present

## 2023-08-04 DIAGNOSIS — M6281 Muscle weakness (generalized): Secondary | ICD-10-CM | POA: Diagnosis not present

## 2023-08-04 DIAGNOSIS — R296 Repeated falls: Secondary | ICD-10-CM | POA: Diagnosis not present

## 2023-08-04 DIAGNOSIS — R2681 Unsteadiness on feet: Secondary | ICD-10-CM | POA: Diagnosis not present

## 2023-08-07 DIAGNOSIS — R2681 Unsteadiness on feet: Secondary | ICD-10-CM | POA: Diagnosis not present

## 2023-08-07 DIAGNOSIS — R296 Repeated falls: Secondary | ICD-10-CM | POA: Diagnosis not present

## 2023-08-07 DIAGNOSIS — R41841 Cognitive communication deficit: Secondary | ICD-10-CM | POA: Diagnosis not present

## 2023-08-07 DIAGNOSIS — M6281 Muscle weakness (generalized): Secondary | ICD-10-CM | POA: Diagnosis not present

## 2023-08-09 DIAGNOSIS — R296 Repeated falls: Secondary | ICD-10-CM | POA: Diagnosis not present

## 2023-08-09 DIAGNOSIS — R41841 Cognitive communication deficit: Secondary | ICD-10-CM | POA: Diagnosis not present

## 2023-08-09 DIAGNOSIS — M6281 Muscle weakness (generalized): Secondary | ICD-10-CM | POA: Diagnosis not present

## 2023-08-09 DIAGNOSIS — R2681 Unsteadiness on feet: Secondary | ICD-10-CM | POA: Diagnosis not present

## 2023-08-10 DIAGNOSIS — H04123 Dry eye syndrome of bilateral lacrimal glands: Secondary | ICD-10-CM | POA: Diagnosis not present

## 2023-08-11 DIAGNOSIS — M6281 Muscle weakness (generalized): Secondary | ICD-10-CM | POA: Diagnosis not present

## 2023-08-11 DIAGNOSIS — R296 Repeated falls: Secondary | ICD-10-CM | POA: Diagnosis not present

## 2023-08-11 DIAGNOSIS — R2681 Unsteadiness on feet: Secondary | ICD-10-CM | POA: Diagnosis not present

## 2023-08-11 DIAGNOSIS — R41841 Cognitive communication deficit: Secondary | ICD-10-CM | POA: Diagnosis not present

## 2023-08-14 DIAGNOSIS — R296 Repeated falls: Secondary | ICD-10-CM | POA: Diagnosis not present

## 2023-08-14 DIAGNOSIS — M6281 Muscle weakness (generalized): Secondary | ICD-10-CM | POA: Diagnosis not present

## 2023-08-14 DIAGNOSIS — R2681 Unsteadiness on feet: Secondary | ICD-10-CM | POA: Diagnosis not present

## 2023-08-16 DIAGNOSIS — R2681 Unsteadiness on feet: Secondary | ICD-10-CM | POA: Diagnosis not present

## 2023-08-16 DIAGNOSIS — R296 Repeated falls: Secondary | ICD-10-CM | POA: Diagnosis not present

## 2023-08-16 DIAGNOSIS — M6281 Muscle weakness (generalized): Secondary | ICD-10-CM | POA: Diagnosis not present

## 2023-08-18 DIAGNOSIS — R296 Repeated falls: Secondary | ICD-10-CM | POA: Diagnosis not present

## 2023-08-18 DIAGNOSIS — R2681 Unsteadiness on feet: Secondary | ICD-10-CM | POA: Diagnosis not present

## 2023-08-18 DIAGNOSIS — M6281 Muscle weakness (generalized): Secondary | ICD-10-CM | POA: Diagnosis not present

## 2023-08-21 DIAGNOSIS — R296 Repeated falls: Secondary | ICD-10-CM | POA: Diagnosis not present

## 2023-08-21 DIAGNOSIS — M6281 Muscle weakness (generalized): Secondary | ICD-10-CM | POA: Diagnosis not present

## 2023-08-21 DIAGNOSIS — R2681 Unsteadiness on feet: Secondary | ICD-10-CM | POA: Diagnosis not present

## 2023-08-22 DIAGNOSIS — L821 Other seborrheic keratosis: Secondary | ICD-10-CM | POA: Diagnosis not present

## 2023-08-22 DIAGNOSIS — L570 Actinic keratosis: Secondary | ICD-10-CM | POA: Diagnosis not present

## 2023-08-23 DIAGNOSIS — R296 Repeated falls: Secondary | ICD-10-CM | POA: Diagnosis not present

## 2023-08-23 DIAGNOSIS — R2681 Unsteadiness on feet: Secondary | ICD-10-CM | POA: Diagnosis not present

## 2023-08-23 DIAGNOSIS — M6281 Muscle weakness (generalized): Secondary | ICD-10-CM | POA: Diagnosis not present

## 2023-08-25 DIAGNOSIS — R2681 Unsteadiness on feet: Secondary | ICD-10-CM | POA: Diagnosis not present

## 2023-08-25 DIAGNOSIS — M6281 Muscle weakness (generalized): Secondary | ICD-10-CM | POA: Diagnosis not present

## 2023-08-25 DIAGNOSIS — R296 Repeated falls: Secondary | ICD-10-CM | POA: Diagnosis not present

## 2023-08-28 DIAGNOSIS — M6281 Muscle weakness (generalized): Secondary | ICD-10-CM | POA: Diagnosis not present

## 2023-08-28 DIAGNOSIS — R296 Repeated falls: Secondary | ICD-10-CM | POA: Diagnosis not present

## 2023-08-28 DIAGNOSIS — R2681 Unsteadiness on feet: Secondary | ICD-10-CM | POA: Diagnosis not present

## 2023-08-30 DIAGNOSIS — M6281 Muscle weakness (generalized): Secondary | ICD-10-CM | POA: Diagnosis not present

## 2023-08-30 DIAGNOSIS — R296 Repeated falls: Secondary | ICD-10-CM | POA: Diagnosis not present

## 2023-08-30 DIAGNOSIS — R2681 Unsteadiness on feet: Secondary | ICD-10-CM | POA: Diagnosis not present

## 2023-09-01 DIAGNOSIS — R2681 Unsteadiness on feet: Secondary | ICD-10-CM | POA: Diagnosis not present

## 2023-09-01 DIAGNOSIS — M6281 Muscle weakness (generalized): Secondary | ICD-10-CM | POA: Diagnosis not present

## 2023-09-01 DIAGNOSIS — R296 Repeated falls: Secondary | ICD-10-CM | POA: Diagnosis not present

## 2023-09-04 DIAGNOSIS — R2681 Unsteadiness on feet: Secondary | ICD-10-CM | POA: Diagnosis not present

## 2023-09-04 DIAGNOSIS — R296 Repeated falls: Secondary | ICD-10-CM | POA: Diagnosis not present

## 2023-09-04 DIAGNOSIS — M6281 Muscle weakness (generalized): Secondary | ICD-10-CM | POA: Diagnosis not present

## 2023-09-06 DIAGNOSIS — R296 Repeated falls: Secondary | ICD-10-CM | POA: Diagnosis not present

## 2023-09-06 DIAGNOSIS — M6281 Muscle weakness (generalized): Secondary | ICD-10-CM | POA: Diagnosis not present

## 2023-09-06 DIAGNOSIS — R2681 Unsteadiness on feet: Secondary | ICD-10-CM | POA: Diagnosis not present

## 2023-09-07 DIAGNOSIS — K2900 Acute gastritis without bleeding: Secondary | ICD-10-CM | POA: Diagnosis not present

## 2023-09-07 LAB — CBC WITH DIFFERENTIAL/PLATELET
Absolute Lymphocytes: 3937 {cells}/uL — ABNORMAL HIGH (ref 850–3900)
Absolute Monocytes: 907 {cells}/uL (ref 200–950)
Basophils Absolute: 57 {cells}/uL (ref 0–200)
Basophils Relative: 0.7 %
Eosinophils Absolute: 219 {cells}/uL (ref 15–500)
Eosinophils Relative: 2.7 %
HCT: 38.9 % (ref 35.0–45.0)
Hemoglobin: 13.1 g/dL (ref 11.7–15.5)
MCH: 30.8 pg (ref 27.0–33.0)
MCHC: 33.7 g/dL (ref 32.0–36.0)
MCV: 91.3 fL (ref 80.0–100.0)
MPV: 12 fL (ref 7.5–12.5)
Monocytes Relative: 11.2 %
Neutro Abs: 2981 {cells}/uL (ref 1500–7800)
Neutrophils Relative %: 36.8 %
Platelets: 267 10*3/uL (ref 140–400)
RBC: 4.26 10*6/uL (ref 3.80–5.10)
RDW: 12.3 % (ref 11.0–15.0)
Total Lymphocyte: 48.6 %
WBC: 8.1 10*3/uL (ref 3.8–10.8)

## 2023-09-07 LAB — COMPLETE METABOLIC PANEL WITH GFR
AG Ratio: 1.6 (calc) (ref 1.0–2.5)
ALT: 12 U/L (ref 6–29)
AST: 14 U/L (ref 10–35)
Albumin: 4.1 g/dL (ref 3.6–5.1)
Alkaline phosphatase (APISO): 55 U/L (ref 37–153)
BUN: 18 mg/dL (ref 7–25)
CO2: 24 mmol/L (ref 20–32)
Calcium: 9.3 mg/dL (ref 8.6–10.4)
Chloride: 104 mmol/L (ref 98–110)
Creat: 0.77 mg/dL (ref 0.60–0.95)
Globulin: 2.5 g/dL (ref 1.9–3.7)
Glucose, Bld: 135 mg/dL — ABNORMAL HIGH (ref 65–99)
Potassium: 4.5 mmol/L (ref 3.5–5.3)
Sodium: 140 mmol/L (ref 135–146)
Total Bilirubin: 0.8 mg/dL (ref 0.2–1.2)
Total Protein: 6.6 g/dL (ref 6.1–8.1)
eGFR: 69 mL/min/{1.73_m2} (ref >=60)

## 2023-09-08 DIAGNOSIS — M6281 Muscle weakness (generalized): Secondary | ICD-10-CM | POA: Diagnosis not present

## 2023-09-08 DIAGNOSIS — R296 Repeated falls: Secondary | ICD-10-CM | POA: Diagnosis not present

## 2023-09-08 DIAGNOSIS — R2681 Unsteadiness on feet: Secondary | ICD-10-CM | POA: Diagnosis not present

## 2023-09-11 DIAGNOSIS — E119 Type 2 diabetes mellitus without complications: Secondary | ICD-10-CM | POA: Diagnosis not present

## 2023-09-11 DIAGNOSIS — H353211 Exudative age-related macular degeneration, right eye, with active choroidal neovascularization: Secondary | ICD-10-CM | POA: Diagnosis not present

## 2023-09-11 DIAGNOSIS — H35033 Hypertensive retinopathy, bilateral: Secondary | ICD-10-CM | POA: Diagnosis not present

## 2023-09-11 DIAGNOSIS — H353123 Nonexudative age-related macular degeneration, left eye, advanced atrophic without subfoveal involvement: Secondary | ICD-10-CM | POA: Diagnosis not present

## 2023-09-11 DIAGNOSIS — H43813 Vitreous degeneration, bilateral: Secondary | ICD-10-CM | POA: Diagnosis not present

## 2023-09-11 DIAGNOSIS — Z961 Presence of intraocular lens: Secondary | ICD-10-CM | POA: Diagnosis not present

## 2023-09-11 LAB — HM DIABETES EYE EXAM

## 2023-09-14 ENCOUNTER — Non-Acute Institutional Stay: Payer: Medicare Other | Admitting: Nurse Practitioner

## 2023-09-14 ENCOUNTER — Encounter: Payer: Self-pay | Admitting: Nurse Practitioner

## 2023-09-14 VITALS — BP 128/76 | HR 92 | Temp 97.9°F | Resp 17 | Ht 63.0 in | Wt 164.8 lb

## 2023-09-14 DIAGNOSIS — E1121 Type 2 diabetes mellitus with diabetic nephropathy: Secondary | ICD-10-CM

## 2023-09-14 DIAGNOSIS — F418 Other specified anxiety disorders: Secondary | ICD-10-CM

## 2023-09-14 DIAGNOSIS — E785 Hyperlipidemia, unspecified: Secondary | ICD-10-CM

## 2023-09-14 DIAGNOSIS — I10 Essential (primary) hypertension: Secondary | ICD-10-CM | POA: Diagnosis not present

## 2023-09-14 DIAGNOSIS — M810 Age-related osteoporosis without current pathological fracture: Secondary | ICD-10-CM

## 2023-09-14 DIAGNOSIS — M5431 Sciatica, right side: Secondary | ICD-10-CM | POA: Diagnosis not present

## 2023-09-14 DIAGNOSIS — N3942 Incontinence without sensory awareness: Secondary | ICD-10-CM

## 2023-09-14 NOTE — Assessment & Plan Note (Signed)
 LDL 126 05/30/23, taking Atorvastatin

## 2023-09-14 NOTE — Assessment & Plan Note (Signed)
 takes Alendronate. Ca, Vit D. DEXA 12/24/20 t score -2.6, 03/02/23 declined DEXA

## 2023-09-14 NOTE — Assessment & Plan Note (Signed)
 restart Metformin 500mg  qd, last Hgb a1c 6.5 11/19/20<<7.2 07/15/22<<8.1 05/30/23, update Hgb A1c prior to the next appointment.

## 2023-09-14 NOTE — Progress Notes (Signed)
 Location:   Clinic [facility]   Place of Service:  Clinic (12) Provider: [facility] Talin Rozeboom NP  Ceci Taliaferro X, NP  Patient Care Team: Matin Mattioli X, NP as PCP - General (Internal Medicine) Giorgi Debruin X, NP as Nurse Practitioner (Internal Medicine)  Extended Emergency Contact Information Primary Emergency Contact: Hilbert Corrigan [place] Home Phone: [phone] Relation: Niece  Code Status:  DNR Goals of care: Advanced Directive information    09/14/2023    1:08 PM  Advanced Directives  Does Patient Have a Medical Advance Directive? No  Would patient like information on creating a medical advance directive? No - Patient declined     Chief Complaint  Patient presents with   Follow-up    Follow up and discuss tdap and covid vaccines.    HPI:  Pt is a 89 y.o. female seen today for medical management of chronic diseases.    Muscle cramps, better,  Labs unremarkable, may consider Alprazolam or Methocarbamol              HTN, blood pressure is controlled on Lisinopril, Atorvastatin, Bun/creat 18/0.77 09/07/23             LDL 126 05/30/23, taking Atorvastatin             Her mood has no change, prn Trazodone, TSH 3.05 10/25/22             T2DM, restart Metformin 500mg  qd, last Hgb a1c 6.5 11/19/20<<7.2 07/15/22<<8.1 05/30/23             Urinary frequency, taking Oxybutynin, c/o  dry eyes, mouth, blurred vision.              OP takes Alendronate. Ca, Vit D. DEXA 12/24/20 t score -2.6, 03/02/23 declined DEXA             Lower back pain, right sciatica, comes and goes for over a year.        Past Medical History:  Diagnosis Date   Allergy    Arthritis    Diabetes mellitus    Hernia, abdominal    Hyperlipidemia    Hypertension    Osteopenia, senile 09/26/2012   T score - 1.6 Left femur   Past Surgical History:  Procedure Laterality Date   APPENDECTOMY     BACK SURGERY     1965   EYE SURGERY     GUM SURGERY     KNEE SURGERY      No Known Allergies  Allergies as of  09/14/2023   No Known Allergies      Medication List        Accurate as of September 14, 2023 11:59 PM. If you have any questions, ask your nurse or doctor.          ascorbic acid 500 MG tablet Commonly known as: VITAMIN C Take 500 mg by mouth daily.   atorvastatin 10 MG tablet Commonly known as: LIPITOR TAKE ONE TABLET BY MOUTH ONCE DAILY   Biotene Moisturizing Mouth Soln Use as directed 5 sprays in the mouth or throat 3 (three) times daily as needed.   Calcium Carbonate-Vitamin D3 600-400 MG-UNIT Tabs Take 1 tablet by mouth 2 (two) times daily.   fish oil-omega-3 fatty acids 1000 MG capsule Take 2 g by mouth daily.   Flax Oil-Fish Oil-Borage Oil Caps Take by mouth daily.   guaiFENesin 600 MG 12 hr tablet Commonly known as: Mucinex Take 1 tablet (600 mg total) by mouth 2 (two)  times daily.   hydrocortisone 2.5%-Eucerin equivalent 1:1 cream mixture Apply topically 2 (two) times daily.   lisinopril 10 MG tablet Commonly known as: ZESTRIL TAKE ONE TABLET BY MOUTH DAILY   magnesium oxide 400 MG tablet Commonly known as: MAG-OX Take 400 mg by mouth daily.   metFORMIN 500 MG tablet Commonly known as: GLUCOPHAGE Take 1 tablet (500 mg total) by mouth daily with breakfast.   methocarbamol 500 MG tablet Commonly known as: ROBAXIN Take 0.5 tablets (250 mg total) by mouth every 8 (eight) hours as needed for muscle spasms.   omeprazole 20 MG capsule Commonly known as: PRILOSEC Take 1 capsule (20 mg total) by mouth daily.   oxybutynin 10 MG 24 hr tablet Commonly known as: DITROPAN-XL TAKE ONE TABLET AT BEDTIME.   PRESERVISION AREDS 2+MULTI VIT PO Take 1 tablet by mouth 2 (two) times daily.   traZODone 50 MG tablet Commonly known as: DESYREL Take 0.5 tablets (25 mg total) by mouth at bedtime as needed for sleep.   triamcinolone ointment 0.5 % Commonly known as: KENALOG Apply 1 Application topically 2 (two) times daily.   Vitamin B-12 5000 MCG Subl Place under  the tongue daily.   Vitamin D3 50 MCG (2000 UT) Tabs Take 1 tablet by mouth daily.   vitamin E 45 MG (100 UNITS) capsule Take by mouth daily.        Review of Systems  Constitutional:  Negative for appetite change, fatigue and fever.  HENT:  Positive for hearing loss. Negative for congestion and sore throat.   Eyes:  Negative for visual disturbance.  Respiratory:  Positive for cough. Negative for shortness of breath and wheezing.   Cardiovascular:  Negative for leg swelling.  Gastrointestinal:  Negative for abdominal pain, diarrhea, nausea and vomiting.       Acid reflux, belching at times, mucous collected in the back of mouth/throat.   Genitourinary:  Positive for frequency. Negative for dysuria, hematuria and urgency.       Average 6-7x/night.   Musculoskeletal:  Positive for arthralgias, back pain and gait problem.       Right sciatica. Walks with walker and cane. Left buttock and leg cramps occasionally when getting out of bed  Skin:  Negative for color change.  Neurological:  Negative for speech difficulty, weakness and headaches.  Psychiatric/Behavioral:  Positive for sleep disturbance. Negative for behavioral problems. The patient is not nervous/anxious.        Memory lapses.     Immunization History  Administered Date(s) Administered   Influenza Split 04/13/2012   Influenza Whole 04/12/2018   Influenza, High Dose Seasonal PF 04/24/2019, 04/22/2020, 05/10/2023   Influenza,inj,Quad PF,6+ Mos 06/14/2013, 04/14/2014, 04/27/2015   Influenza-Unspecified 04/10/2016   Moderna Sars-Covid-2 Vaccination 07/15/2019, 08/12/2019, 05/19/2020   PPD Test 12/16/2011, 12/16/2011   Pneumococcal Conjugate-13 07/14/2014   Pneumococcal Polysaccharide-23 01/04/2010   Tdap 12/16/2011   Pertinent  Health Maintenance Due  Topic Date Due   HEMOGLOBIN A1C  11/27/2023   OPHTHALMOLOGY EXAM  12/13/2023   FOOT EXAM  03/01/2024   INFLUENZA VACCINE  Completed   DEXA SCAN  Completed       03/16/2023    1:33 PM 06/01/2023    2:00 PM 06/29/2023    1:58 PM 07/13/2023    1:24 PM 09/14/2023    1:08 PM  Fall Risk  Falls in the past year? 0 0 0 1 1  Was there an injury with Fall? 0 0 0 0 0  Fall Risk Category Calculator  0 0 0 1 1  Patient at Risk for Falls Due to No Fall Risks      Fall risk Follow up Falls evaluation completed       Functional Status Survey:    Vitals:   09/14/23 1311  BP: 128/76  Pulse: 92  Resp: 17  Temp: 97.9 F (36.6 C)  SpO2: 96%  Weight: 164 lb 12.8 oz (74.8 kg)  Height: 5\' 3"  (1.6 m)   Body mass index is 29.19 kg/m. Physical Exam Vitals and nursing note reviewed.  Constitutional:      Appearance: Normal appearance.  HENT:     Head: Normocephalic and atraumatic.     Right Ear: Tympanic membrane normal.     Left Ear: Tympanic membrane normal.     Ears:     Comments: R ear impacted cerumen removed with ear lavage.     Nose: Nose normal. No congestion or rhinorrhea.     Mouth/Throat:     Mouth: Mucous membranes are moist.  Eyes:     Extraocular Movements: Extraocular movements intact.     Conjunctiva/sclera: Conjunctivae normal.     Pupils: Pupils are equal, round, and reactive to light.  Cardiovascular:     Rate and Rhythm: Normal rate and regular rhythm.     Heart sounds: No murmur heard. Pulmonary:     Effort: Pulmonary effort is normal.     Breath sounds: Rales present. No rhonchi.     Comments: Bibasilar  Abdominal:     General: Bowel sounds are normal.     Palpations: Abdomen is soft.     Tenderness: There is no abdominal tenderness.     Comments: Umbilical hernia. Pain occasionally, lasts about an hour so, resolves w/o intervention.   Musculoskeletal:     Cervical back: Normal range of motion and neck supple.     Right lower leg: No edema.     Left lower leg: No edema.  Skin:    General: Skin is warm and dry.     Comments: Upper left buttock cyst about a golf ball seized, no pain. F/u dermatology.   Neurological:      Mental Status: She is alert. Mental status is at baseline.     Gait: Gait abnormal.     Comments: Oriented to person, place.   Psychiatric:        Mood and Affect: Mood normal.        Behavior: Behavior normal.        Thought Content: Thought content normal.     Labs reviewed: Recent Labs    10/25/22 0700 05/30/23 0739 09/07/23 0718  NA 138 139 140  K 4.1 4.2 4.5  CL 104 101 104  CO2 25 26 24   GLUCOSE 124* 164* 135*  BUN 18 15 18   CREATININE 0.73 0.84 0.77  CALCIUM 9.2 10.1 9.3   Recent Labs    10/25/22 0700 05/30/23 0739 09/07/23 0718  AST 18 20 14   ALT 14 14 12   BILITOT 0.8 0.9 0.8  PROT 6.6 7.4 6.6   Recent Labs    10/25/22 0700 05/30/23 0739 09/07/23 0718  WBC 8.7 11.5* 8.1  NEUTROABS 3,376 3,772 2,981  HGB 12.9 14.4 13.1  HCT 38.3 42.8 38.9  MCV 90.8 91.5 91.3  PLT 280 313 267   Lab Results  Component Value Date   TSH 3.05 10/25/2022   Lab Results  Component Value Date   HGBA1C 8.1 (H) 05/30/2023   Lab Results  Component Value  Date   CHOL 233 (H) 05/30/2023   HDL 63 05/30/2023   LDLCALC 126 (H) 05/30/2023   LDLDIRECT 133 (H) 07/14/2014   TRIG 288 (H) 05/30/2023   CHOLHDL 3.7 05/30/2023    Significant Diagnostic Results in last 30 days:  No results found.  Assessment/Plan  Diabetes mellitus type 2, controlled (HCC) restart Metformin 500mg  qd, last Hgb a1c 6.5 11/19/20<<7.2 07/15/22<<8.1 05/30/23, update Hgb A1c prior to the next appointment.   Incontinent of urine taking Oxybutynin, c/o  dry eyes, mouth, blurred vision.   Osteoporosis takes Alendronate. Ca, Vit D. DEXA 12/24/20 t score -2.6, 03/02/23 declined DEXA  Back pain with right-sided sciatica Lower back pain, right sciatica, comes and goes for over a year.  ambulatory with walker.   Depression with anxiety Her mood has no change, prn Trazodone, TSH 3.05 10/25/22  Hyperlipidemia LDL 126 05/30/23, taking Atorvastatin  Hypertension blood pressure is controlled on Lisinopril,  Atorvastatin, Bun/creat 18/0.77 09/07/23   Family/ staff Communication: plan of care reviewed with the patient  Labs/tests ordered:  Hgb a1c

## 2023-09-14 NOTE — Assessment & Plan Note (Signed)
 blood pressure is controlled on Lisinopril, Atorvastatin, Bun/creat 18/0.77 09/07/23

## 2023-09-14 NOTE — Assessment & Plan Note (Signed)
Her mood has no change, prn Trazodone, TSH 3.05 10/25/22

## 2023-09-14 NOTE — Assessment & Plan Note (Signed)
 Lower back pain, right sciatica, comes and goes for over a year.  ambulatory with walker.

## 2023-09-14 NOTE — Assessment & Plan Note (Signed)
taking Oxybutynin, c/o  dry eyes, mouth, blurred vision.  

## 2023-09-15 ENCOUNTER — Encounter: Payer: Self-pay | Admitting: Nurse Practitioner

## 2023-10-04 DIAGNOSIS — E119 Type 2 diabetes mellitus without complications: Secondary | ICD-10-CM | POA: Diagnosis not present

## 2023-10-24 DIAGNOSIS — H353211 Exudative age-related macular degeneration, right eye, with active choroidal neovascularization: Secondary | ICD-10-CM | POA: Diagnosis not present

## 2023-12-11 DIAGNOSIS — H353211 Exudative age-related macular degeneration, right eye, with active choroidal neovascularization: Secondary | ICD-10-CM | POA: Diagnosis not present

## 2024-01-04 ENCOUNTER — Other Ambulatory Visit: Payer: Self-pay | Admitting: Nurse Practitioner

## 2024-01-16 ENCOUNTER — Other Ambulatory Visit

## 2024-01-16 DIAGNOSIS — E1121 Type 2 diabetes mellitus with diabetic nephropathy: Secondary | ICD-10-CM | POA: Diagnosis not present

## 2024-01-16 LAB — HEMOGLOBIN A1C
Hgb A1c MFr Bld: 6.8 % — ABNORMAL HIGH (ref <5.7)
Mean Plasma Glucose: 148 mg/dL
eAG (mmol/L): 8.2 mmol/L

## 2024-01-18 ENCOUNTER — Non-Acute Institutional Stay: Admitting: Nurse Practitioner

## 2024-01-18 ENCOUNTER — Encounter: Payer: Self-pay | Admitting: Nurse Practitioner

## 2024-01-18 VITALS — BP 128/74 | HR 71 | Temp 98.4°F | Ht 63.0 in | Wt 161.0 lb

## 2024-01-18 DIAGNOSIS — N3942 Incontinence without sensory awareness: Secondary | ICD-10-CM

## 2024-01-18 DIAGNOSIS — E785 Hyperlipidemia, unspecified: Secondary | ICD-10-CM | POA: Diagnosis not present

## 2024-01-18 DIAGNOSIS — F418 Other specified anxiety disorders: Secondary | ICD-10-CM

## 2024-01-18 DIAGNOSIS — R222 Localized swelling, mass and lump, trunk: Secondary | ICD-10-CM

## 2024-01-18 DIAGNOSIS — E1121 Type 2 diabetes mellitus with diabetic nephropathy: Secondary | ICD-10-CM | POA: Diagnosis not present

## 2024-01-18 DIAGNOSIS — M810 Age-related osteoporosis without current pathological fracture: Secondary | ICD-10-CM

## 2024-01-18 DIAGNOSIS — M5431 Sciatica, right side: Secondary | ICD-10-CM | POA: Diagnosis not present

## 2024-01-18 MED ORDER — METFORMIN HCL 500 MG PO TABS: 500.0000 mg | ORAL_TABLET | ORAL | 3 refills | Status: DC

## 2024-01-18 NOTE — Assessment & Plan Note (Addendum)
 Her mood has no change, off Trazodone , TSH 3.05 10/25/22

## 2024-01-18 NOTE — Assessment & Plan Note (Signed)
 LDL 126 05/30/23, taking Atorvastatin

## 2024-01-18 NOTE — Assessment & Plan Note (Signed)
 restart Metformin  500mg  qd, last Hgb a1c 6.5 11/19/20<<7.2 07/15/22<<8.1 05/30/23>>6.8 01/16/24

## 2024-01-18 NOTE — Assessment & Plan Note (Signed)
 takes Alendronate. Ca, Vit D. DEXA 12/24/20 t score -2.6, 03/02/23 declined DEXA

## 2024-01-18 NOTE — Assessment & Plan Note (Signed)
 blood pressure is controlled on Lisinopril, Atorvastatin, Bun/creat 18/0.77 09/07/23

## 2024-01-18 NOTE — Assessment & Plan Note (Signed)
 Left lower back lump, left, size of a large egg, progressing, sore to touch, getting bigger. MRI w contrast to evaluate further.

## 2024-01-18 NOTE — Assessment & Plan Note (Signed)
 Lower back pain, right sciatica, comes and goes, chronic.

## 2024-01-18 NOTE — Assessment & Plan Note (Addendum)
 Off Oxybutynin , c/o  dry eyes, mouth, blurred vision.

## 2024-01-18 NOTE — Progress Notes (Signed)
 Location:   Clinic [facility]   Place of Service:  Clinic (12) Provider: [facility] Ledora Delker NP  Maurissa Ambrose X, NP  Patient Care Team: Kashira Behunin X, NP as PCP - General (Internal Medicine) Aysa Larivee X, NP as Nurse Practitioner (Internal Medicine)  Extended Emergency Contact Information Primary Emergency Contact: [name],[name]  [place] Home Phone: [phone] Relation: Niece  Code Status:  DNR Goals of care: Advanced Directive information    09/14/2023    1:08 PM  Advanced Directives  Does Patient Have a Medical Advance Directive? No  Would patient like information on creating a medical advance directive? No - Patient declined     Chief Complaint  Patient presents with  . Medical Management of Chronic Issues    4 month follow-up. Patient c/o change in hearing. Patient answered yes to all functional assessment questions. Patient with area of concern on hip     HPI:  Pt is a 89 y.o. female seen today for medical management of chronic diseases.    Muscle cramps, better,  Labs unremarkable             HTN, blood pressure is controlled on Lisinopril , Atorvastatin , Bun/creat 18/0.77 09/07/23             LDL 126 05/30/23, taking Atorvastatin              Her mood has no change, off Trazodone , TSH 3.05 10/25/22             T2DM, restart Metformin  500mg  qd, last Hgb a1c 6.5 11/19/20<<7.2 07/15/22<<8.1 11/19/ Urinary frequency, off  Oxybutynin , c/o  dry eyes, mouth, blurred vision.              OP takes Alendronate . Ca, Vit D. DEXA 12/24/20 t score -2.6, 03/02/23 declined DEXA             Lower back pain, right sciatica, comes and goes, chronic.  Left lower back lump, left, size of a large egg, progressing, sore to touch, getting bigger.        Past Medical History:  Diagnosis Date  . Allergy   . Arthritis   . Diabetes mellitus   . Hernia, abdominal   . Hyperlipidemia   . Hypertension   . Osteopenia, senile 09/26/2012   T score - 1.6 Left femur   Past Surgical History:   Procedure Laterality Date  . APPENDECTOMY    . BACK SURGERY     1965  . DENTAL SURGERY  01/17/2024  . EYE SURGERY    . GUM SURGERY    . KNEE SURGERY      No Known Allergies  Allergies as of 01/18/2024   No Known Allergies      Medication List        Accurate as of January 18, 2024 11:59 PM. If you have any questions, ask your nurse or doctor.          STOP taking these medications    Biotene Moisturizing Mouth Soln Stopped by: Adebayo Ensminger X Braycen Burandt   fish oil-omega-3 fatty acids 1000 MG capsule Stopped by: Delsy Etzkorn X Mee Macdonnell   Flax Oil-Fish Oil-Borage Oil Caps Stopped by: Jlee Harkless X Mohmed Farver   guaiFENesin  600 MG 12 hr tablet Commonly known as: Mucinex  Stopped by: Trevontae Lindahl X Nadine Ryle   magnesium oxide 400 MG tablet Commonly known as: MAG-OX Stopped by: Muhammed Teutsch X Finn Amos   methocarbamol  500 MG tablet Commonly known as: ROBAXIN  Stopped by: Kendyl Bissonnette X Steffon Gladu   omeprazole  20 MG capsule Commonly  known as: PRILOSEC Stopped by: Alexcis Bicking X Gorge Almanza   oxybutynin  10 MG 24 hr tablet Commonly known as: DITROPAN -XL Stopped by: Rayaan Garguilo X Hadley Detloff   traZODone  50 MG tablet Commonly known as: DESYREL  Stopped by: Ronnisha Felber X Levoy Geisen       TAKE these medications    ascorbic acid 500 MG tablet Commonly known as: VITAMIN C Take 500 mg by mouth daily.   atorvastatin  10 MG tablet Commonly known as: LIPITOR TAKE ONE TABLET BY MOUTH ONCE DAILY   CALCIUM  1200+D3 PO Take 1 tablet by mouth daily.   Calcium  Carbonate-Vitamin D3 600-400 MG-UNIT Tabs Take 1 tablet by mouth 2 (two) times daily.   cyanocobalamin  1000 MCG tablet Commonly known as: VITAMIN B12 Take 1,000 mcg by mouth daily. What changed: Another medication with the same name was removed. Continue taking this medication, and follow the directions you see here. Changed by: Aila Terra X Sultana Tierney   D3 5000 125 MCG (5000 UT) capsule Generic drug: Cholecalciferol Take 5,000 Units by mouth daily. What changed: Another medication with the same name was removed. Continue taking this  medication, and follow the directions you see here. Changed by: Maclin Guerrette X Dennise Raabe   hydrocortisone  2.5%-Eucerin equivalent 1:1 cream mixture Apply topically 2 (two) times daily.   lisinopril  10 MG tablet Commonly known as: ZESTRIL  TAKE ONE TABLET BY MOUTH DAILY   magnesium gluconate 500 (27 Mg) MG Tabs tablet Commonly known as: MAGONATE Take 500 mg by mouth daily.   metFORMIN  500 MG tablet Commonly known as: GLUCOPHAGE  Take 1 tablet (500 mg total) by mouth every other day. What changed: when to take this Changed by: Kisean Rollo X Mariusz Jubb   PRESERVISION AREDS 2+MULTI VIT PO Take 1 tablet by mouth 2 (two) times daily.   triamcinolone  ointment 0.5 % Commonly known as: KENALOG  Apply 1 Application topically 2 (two) times daily.   vitamin E 180 MG (400 UNITS) capsule Take 400 Units by mouth daily. What changed: Another medication with the same name was removed. Continue taking this medication, and follow the directions you see here. Changed by: Evangaline Jou X Curties Conigliaro        Review of Systems  Constitutional:  Negative for appetite change, fatigue and fever.  HENT:  Positive for hearing loss. Negative for congestion and sore throat.   Eyes:  Negative for visual disturbance.  Respiratory:  Positive for cough. Negative for shortness of breath and wheezing.   Cardiovascular:  Negative for leg swelling.  Gastrointestinal:  Negative for abdominal pain, diarrhea, nausea and vomiting.       Acid reflux, belching at times, mucous collected in the back of mouth/throat.   Genitourinary:  Positive for frequency. Negative for dysuria, hematuria and urgency.       Average 6-7x/night.   Musculoskeletal:  Positive for arthralgias, back pain and gait problem.       Right sciatica. Walks with walker and cane. Left buttock and leg cramps occasionally when getting out of bed  Skin:  Negative for color change.  Neurological:  Negative for speech difficulty, weakness and headaches.  Psychiatric/Behavioral:  Positive for  sleep disturbance. Negative for behavioral problems. The patient is not nervous/anxious.        Memory lapses.     Immunization History  Administered Date(s) Administered  . Influenza Split 04/13/2012  . Influenza Whole 04/12/2018  . Influenza, High Dose Seasonal PF 04/24/2019, 04/22/2020, 05/10/2023  . Influenza,inj,Quad PF,6+ Mos 06/14/2013, 04/14/2014, 04/27/2015  . Influenza-Unspecified 04/10/2016  . Moderna Sars-Covid-2 Vaccination 07/15/2019, 08/12/2019, 05/19/2020  .  PPD Test 12/16/2011, 12/16/2011  . Pneumococcal Conjugate-13 07/14/2014  . Pneumococcal Polysaccharide-23 01/04/2010  . Tdap 12/16/2011   Pertinent  Health Maintenance Due  Topic Date Due  . OPHTHALMOLOGY EXAM  12/13/2023  . INFLUENZA VACCINE  02/09/2024  . FOOT EXAM  03/01/2024  . HEMOGLOBIN A1C  07/18/2024  . DEXA SCAN  Completed      06/01/2023    2:00 PM 06/29/2023    1:58 PM 07/13/2023    1:24 PM 09/14/2023    1:08 PM 01/18/2024    2:46 PM  Fall Risk  Falls in the past year? 0 0 1 1 1   Was there an injury with Fall? 0 0 0 0 0  Fall Risk Category Calculator 0 0 1 1 1   Patient at Risk for Falls Due to     No Fall Risks  Fall risk Follow up     Falls evaluation completed   Functional Status Survey: Is the patient deaf or have difficulty hearing?: Yes (off/on hearing issues) Does the patient have difficulty seeing, even when wearing glasses/contacts?: Yes Does the patient have difficulty concentrating, remembering, or making decisions?: Yes Does the patient have difficulty walking or climbing stairs?: Yes Does the patient have difficulty dressing or bathing?: Yes (bathing take longer) Does the patient have difficulty doing errands alone such as visiting a doctor's office or shopping?: Yes (not always easy, uses assited devices)  Vitals:   01/18/24 1450  BP: 128/74  Pulse: 71  Temp: 98.4 F (36.9 C)  SpO2: 97%  Weight: 161 lb (73 kg)  Height: 5' 3 (1.6 m)   Body mass index is 28.52  kg/m. Physical Exam Vitals and nursing note reviewed.  Constitutional:      Appearance: Normal appearance.  HENT:     Head: Normocephalic and atraumatic.     Right Ear: Tympanic membrane normal.     Left Ear: Tympanic membrane normal.     Ears:     Comments: R ear impacted cerumen removed with ear lavage.     Nose: Nose normal. No congestion or rhinorrhea.     Mouth/Throat:     Mouth: Mucous membranes are moist.  Eyes:     Extraocular Movements: Extraocular movements intact.     Conjunctiva/sclera: Conjunctivae normal.     Pupils: Pupils are equal, round, and reactive to light.  Cardiovascular:     Rate and Rhythm: Normal rate and regular rhythm.     Heart sounds: No murmur heard. Pulmonary:     Effort: Pulmonary effort is normal.     Breath sounds: Rales present. No rhonchi.     Comments: Bibasilar  Abdominal:     General: Bowel sounds are normal.     Palpations: Abdomen is soft.     Tenderness: There is no abdominal tenderness.     Comments: Umbilical hernia. Pain occasionally, lasts about an hour so, resolves w/o intervention.   Musculoskeletal:     Cervical back: Normal range of motion and neck supple.     Right lower leg: No edema.     Left lower leg: No edema.  Skin:    General: Skin is warm and dry.     Comments: Upper left buttock cyst about a golf ball seized, no pain. F/u dermatology.   Neurological:     Mental Status: She is alert. Mental status is at baseline.     Gait: Gait abnormal.     Comments: Oriented to person, place.   Psychiatric:  Mood and Affect: Mood normal.        Behavior: Behavior normal.        Thought Content: Thought content normal.     Labs reviewed: Recent Labs    05/30/23 0739 09/07/23 0718  NA 139 140  K 4.2 4.5  CL 101 104  CO2 26 24  GLUCOSE 164* 135*  BUN 15 18  CREATININE 0.84 0.77  CALCIUM  10.1 9.3   Recent Labs    05/30/23 0739 09/07/23 0718  AST 20 14  ALT 14 12  BILITOT 0.9 0.8  PROT 7.4 6.6    Recent Labs    05/30/23 0739 09/07/23 0718  WBC 11.5* 8.1  NEUTROABS 3,772 2,981  HGB 14.4 13.1  HCT 42.8 38.9  MCV 91.5 91.3  PLT 313 267   Lab Results  Component Value Date   TSH 3.05 10/25/2022   Lab Results  Component Value Date   HGBA1C 6.8 (H) 01/16/2024   Lab Results  Component Value Date   CHOL 233 (H) 05/30/2023   HDL 63 05/30/2023   LDLCALC 126 (H) 05/30/2023   LDLDIRECT 133 (H) 07/14/2014   TRIG 288 (H) 05/30/2023   CHOLHDL 3.7 05/30/2023    Significant Diagnostic Results in last 30 days:  No results found.  Assessment/Plan  Diabetes mellitus type 2, controlled (HCC) restart Metformin  500mg  qd, last Hgb a1c 6.5 11/19/20<<7.2 07/15/22<<8.1 05/30/23>>6.8 01/16/24  Incontinent of urine Off Oxybutynin , c/o  dry eyes, mouth, blurred vision.   Osteoporosis takes Alendronate . Ca, Vit D. DEXA 12/24/20 t score -2.6, 03/02/23 declined DEXA  Back pain with right-sided sciatica Lower back pain, right sciatica, comes and goes, chronic.  Depression with anxiety Her mood has no change, off Trazodone , TSH 3.05 10/25/22  Hyperlipidemia  LDL 126 05/30/23, taking Atorvastatin   Hypertension  blood pressure is controlled on Lisinopril , Atorvastatin , Bun/creat 18/0.77 09/07/23  Palpable mass of lower back Left lower back lump, left, size of a large egg, progressing, sore to touch, getting bigger. MRI w contrast to evaluate further.    Family/ staff Communication: Plan of care reviewed with the patient  Labs/tests ordered: None

## 2024-01-19 ENCOUNTER — Encounter: Payer: Self-pay | Admitting: Nurse Practitioner

## 2024-01-29 ENCOUNTER — Ambulatory Visit (HOSPITAL_COMMUNITY)
Admission: RE | Admit: 2024-01-29 | Discharge: 2024-01-29 | Disposition: A | Discharge status: Home / Self Care | Source: Ambulatory Visit | Attending: Nurse Practitioner | Admitting: Nurse Practitioner

## 2024-01-29 DIAGNOSIS — R222 Localized swelling, mass and lump, trunk: Secondary | ICD-10-CM | POA: Insufficient documentation

## 2024-01-29 DIAGNOSIS — D259 Leiomyoma of uterus, unspecified: Secondary | ICD-10-CM | POA: Diagnosis not present

## 2024-01-29 DIAGNOSIS — K5730 Diverticulosis of large intestine without perforation or abscess without bleeding: Secondary | ICD-10-CM | POA: Diagnosis not present

## 2024-01-29 DIAGNOSIS — K429 Umbilical hernia without obstruction or gangrene: Secondary | ICD-10-CM | POA: Diagnosis not present

## 2024-01-29 MED ORDER — GADOBUTROL 1 MMOL/ML IV SOLN
7.0000 mL | Freq: Once | INTRAVENOUS | Status: AC | PRN
  Administered 2024-01-29: 7 mL via INTRAVENOUS

## 2024-02-01 ENCOUNTER — Ambulatory Visit: Payer: Self-pay | Admitting: Nurse Practitioner

## 2024-02-01 ENCOUNTER — Other Ambulatory Visit: Payer: Self-pay | Admitting: Nurse Practitioner

## 2024-02-01 DIAGNOSIS — R222 Localized swelling, mass and lump, trunk: Secondary | ICD-10-CM

## 2024-02-02 ENCOUNTER — Telehealth: Payer: Self-pay

## 2024-02-02 NOTE — Telephone Encounter (Signed)
 Outgoing call placed to [facility], whom we have listed as an emergency contact. I was unable to locate documentation in chart of Jenkins Mania being a person whom we can share information with.  Left Voicemail for Apolinar to return call when available to verify Jenkins Mania as a person of contact (if confirmed we will need to add this to patients demographic tab), and at that time I will call Ann.   Awaiting return call  S.Chrae  (Sh-ray)/CMA

## 2024-02-02 NOTE — Telephone Encounter (Signed)
 Copied from CRM [id number]. Topic: Clinical - Lab/Test Results >> Feb 02, 2024 10:34 AM Mercer PEDLAR wrote: Reason for CRM: Verneita, patient's nurse calling to request callback for MRI results to be discussed with patient's emergency contact, Jenkins Mania.  Callback: [phone]  Verneita stated that patient received a call for the results but did not understand them.

## 2024-02-05 ENCOUNTER — Telehealth: Payer: Self-pay | Admitting: *Deleted

## 2024-02-05 NOTE — Telephone Encounter (Signed)
 Zebedee, nurse with [facility], called and stated that patient had questions regarding the MRI/Surgery. Requested that I call POA Jenkins Mania #[phone]  I called patient and discussed the results with patient and asked if it was ok to call Jenkins Mania. Patient gave verbal to call.   Called Jenkins Mania and discussed the Results.  Agreed and understood.

## 2024-02-22 ENCOUNTER — Encounter: Payer: Self-pay | Admitting: Nurse Practitioner

## 2024-02-22 ENCOUNTER — Ambulatory Visit: Admitting: Nurse Practitioner

## 2024-02-22 VITALS — BP 122/78 | HR 78 | Temp 98.2°F | Ht 63.0 in | Wt 162.0 lb

## 2024-02-22 DIAGNOSIS — R222 Localized swelling, mass and lump, trunk: Secondary | ICD-10-CM | POA: Diagnosis not present

## 2024-02-22 DIAGNOSIS — E1121 Type 2 diabetes mellitus with diabetic nephropathy: Secondary | ICD-10-CM

## 2024-02-22 NOTE — Assessment & Plan Note (Signed)
 Left lower back lump, left, size of a large egg, progressing, sore to touch, getting bigger.    MR 01/29/24  Superficial heterogeneously contrast enhancing mass in the subcutaneous left buttock measuring 6.4 x 3.5 cm. This is new compared to prior examination dated 2015 of uncertain nature; neither clearly benign nor malignant. Consider tissue sampling or excision if clinically appropriate given very advanced patient age.  Referral to surgery mede.

## 2024-02-22 NOTE — Assessment & Plan Note (Signed)
,   blood pressure is controlled on Lisinopril , Atorvastatin , Bun/creat 18/0.77 09/07/23

## 2024-02-22 NOTE — Assessment & Plan Note (Signed)
 restart Metformin  500mg  qd, last Hgb a1c 6.5 11/19/20<<7.2 07/15/22<<8.1 05/29/24

## 2024-02-22 NOTE — Progress Notes (Signed)
 Location:   Clinic at [facility]   Place of Service:    Provider: Larwance Hark NP  Tae Robak X, NP  Patient Care Team: Koya Hunger X, NP as PCP - General (Internal Medicine) Antion Andres X, NP as Nurse Practitioner (Internal Medicine) Josh Server, MD as Referring Physician (Ophthalmology)  Extended Emergency Contact Information Primary Emergency Contact: [name],[name]  [place] Home Phone: [phone] Mobile Phone: [phone] Relation: Niece  Code Status:  DNR Goals of care: Advanced Directive information    09/14/2023    1:08 PM  Advanced Directives  Does Patient Have a Medical Advance Directive? No  Would patient like information on creating a medical advance directive? No - Patient declined     Chief Complaint  Patient presents with   Medical Management of Chronic Issues    4 week follow-up. SABRA Request for eye exam sent to Dr.Govind. Discussed need for shingrix and covid booster. Discuss MRI, patient states she prefers that the provider tell her what to do versus deciding herself     HPI:  Pt is a 89 y.o. female seen today for medical management of chronic diseases.     Muscle cramps, better,  Labs unremarkable             HTN, blood pressure is controlled on Lisinopril , Atorvastatin , Bun/creat 18/0.77 09/07/23             LDL 126 05/30/23, taking Atorvastatin              Her mood has no change, off Trazodone , TSH 3.05 10/25/22             T2DM, restart Metformin  500mg  qd, last Hgb a1c 6.5 11/19/20<<7.2 07/15/22<<8.1 05/29/24 Urinary frequency, off  Oxybutynin , c/o  dry eyes, mouth, blurred vision.              OP takes Alendronate . Ca, Vit D. DEXA 12/24/20 t score -2.6, 03/02/23 declined DEXA             Lower back pain, right sciatica, comes and goes, chronic.             Left lower back lump, left, size of a large egg, progressing, sore to touch, getting bigger.    MR 01/29/24  Superficial heterogeneously contrast enhancing mass in the subcutaneous left buttock  measuring 6.4 x 3.5 cm. This is new compared to prior examination dated 2015 of uncertain nature; neither clearly benign nor malignant. Consider tissue sampling or excision if clinically appropriate given very advanced patient age.     Past Medical History:  Diagnosis Date   Allergy    Arthritis    Diabetes mellitus    Hernia, abdominal    Hyperlipidemia    Hypertension    Osteopenia, senile 09/26/2012   T score - 1.6 Left femur   Past Surgical History:  Procedure Laterality Date   APPENDECTOMY     BACK SURGERY     1965   DENTAL SURGERY  01/17/2024   EYE SURGERY     GUM SURGERY     KNEE SURGERY      No Known Allergies  Allergies as of 02/22/2024   No Known Allergies      Medication List        Accurate as of February 22, 2024 11:59 PM. If you have any questions, ask your nurse or doctor.          ascorbic acid 500 MG tablet Commonly known as: VITAMIN C Take 500 mg by  mouth daily.   atorvastatin  10 MG tablet Commonly known as: LIPITOR TAKE ONE TABLET BY MOUTH ONCE DAILY   CALCIUM  1200+D3 PO Take 1 tablet by mouth daily.   Calcium  Carbonate-Vitamin D3 600-400 MG-UNIT Tabs Take 1 tablet by mouth 2 (two) times daily.   cyanocobalamin  1000 MCG tablet Commonly known as: VITAMIN B12 Take 1,000 mcg by mouth daily.   D3 5000 125 MCG (5000 UT) capsule Generic drug: Cholecalciferol Take 5,000 Units by mouth daily.   hydrocortisone  2.5%-Eucerin equivalent 1:1 cream mixture Apply topically 2 (two) times daily.   lisinopril  10 MG tablet Commonly known as: ZESTRIL  TAKE ONE TABLET BY MOUTH DAILY   magnesium gluconate 500 (27 Mg) MG Tabs tablet Commonly known as: MAGONATE Take 500 mg by mouth daily.   metFORMIN  500 MG tablet Commonly known as: GLUCOPHAGE  Take 1 tablet (500 mg total) by mouth every other day.   PRESERVISION AREDS 2+MULTI VIT PO Take 1 tablet by mouth 2 (two) times daily.   triamcinolone  ointment 0.5 % Commonly known as: KENALOG  Apply  1 Application topically 2 (two) times daily.   vitamin E 180 MG (400 UNITS) capsule Take 400 Units by mouth daily.        Review of Systems  Constitutional:  Negative for appetite change, fatigue and fever.  HENT:  Positive for hearing loss. Negative for congestion and sore throat.   Eyes:  Negative for visual disturbance.  Respiratory:  Positive for cough. Negative for shortness of breath and wheezing.   Cardiovascular:  Negative for leg swelling.  Gastrointestinal:  Negative for abdominal pain, diarrhea, nausea and vomiting.       Acid reflux, belching at times, mucous collected in the back of mouth/throat.   Genitourinary:  Positive for frequency. Negative for dysuria, hematuria and urgency.       Average 6-7x/night.   Musculoskeletal:  Positive for arthralgias, back pain and gait problem.       Right sciatica. Walks with walker and cane. Left buttock and leg cramps occasionally when getting out of bed  Skin:  Negative for color change.  Neurological:  Negative for speech difficulty, weakness and headaches.  Psychiatric/Behavioral:  Positive for sleep disturbance. Negative for behavioral problems. The patient is not nervous/anxious.        Memory lapses.     Immunization History  Administered Date(s) Administered   Influenza Split 04/13/2012   Influenza Whole 04/12/2018   Influenza, High Dose Seasonal PF 04/24/2019, 04/22/2020, 05/10/2023   Influenza,inj,Quad PF,6+ Mos 06/14/2013, 04/14/2014, 04/27/2015   Influenza-Unspecified 04/10/2016   Moderna Covid-19 Vaccine Bivalent Booster 93yrs & up 12/08/2020   Moderna Sars-Covid-2 Vaccination 07/15/2019, 08/12/2019, 05/19/2020   PPD Test 12/16/2011, 12/16/2011   Pneumococcal Conjugate-13 07/14/2014   Pneumococcal Polysaccharide-23 01/04/2010   Tdap 12/16/2011, 03/14/2023   Zoster Recombinant(Shingrix) 03/14/2023   Pertinent  Health Maintenance Due  Topic Date Due   OPHTHALMOLOGY EXAM  12/13/2023   INFLUENZA VACCINE   04/10/2024 (Originally 02/09/2024)   FOOT EXAM  03/01/2024   HEMOGLOBIN A1C  07/18/2024   DEXA SCAN  Completed      06/01/2023    2:00 PM 06/29/2023    1:58 PM 07/13/2023    1:24 PM 09/14/2023    1:08 PM 01/18/2024    2:46 PM  Fall Risk  Falls in the past year? 0 0 1 1 1   Was there an injury with Fall? 0 0 0 0 0  Fall Risk Category Calculator 0 0 1 1 1   Patient at Risk for  Falls Due to     No Fall Risks  Fall risk Follow up     Falls evaluation completed   Functional Status Survey:    Vitals:   02/22/24 1050  BP: 122/78  Pulse: 78  Temp: 98.2 F (36.8 C)  SpO2: 98%  Weight: 162 lb (73.5 kg)  Height: 5' 3 (1.6 m)   Body mass index is 28.7 kg/m. Physical Exam Vitals and nursing note reviewed.  Constitutional:      Appearance: Normal appearance.  HENT:     Head: Normocephalic and atraumatic.     Right Ear: Tympanic membrane normal.     Left Ear: Tympanic membrane normal.     Ears:     Comments: R ear impacted cerumen removed with ear lavage.     Nose: Nose normal. No congestion or rhinorrhea.     Mouth/Throat:     Mouth: Mucous membranes are moist.  Eyes:     Extraocular Movements: Extraocular movements intact.     Conjunctiva/sclera: Conjunctivae normal.     Pupils: Pupils are equal, round, and reactive to light.  Cardiovascular:     Rate and Rhythm: Normal rate and regular rhythm.     Heart sounds: No murmur heard. Pulmonary:     Effort: Pulmonary effort is normal.     Breath sounds: Rales present. No rhonchi.     Comments: Bibasilar  Abdominal:     General: Bowel sounds are normal.     Palpations: Abdomen is soft.     Tenderness: There is no abdominal tenderness.     Comments: Umbilical hernia. Pain occasionally, lasts about an hour so, resolves w/o intervention.   Musculoskeletal:     Cervical back: Normal range of motion and neck supple.     Right lower leg: No edema.     Left lower leg: No edema.  Skin:    General: Skin is warm and dry.     Comments:  Upper left buttock cyst about a golf ball seized, no pain. F/u dermatology.   Neurological:     Mental Status: She is alert. Mental status is at baseline.     Gait: Gait abnormal.     Comments: Oriented to person, place.   Psychiatric:        Mood and Affect: Mood normal.        Behavior: Behavior normal.        Thought Content: Thought content normal.     Labs reviewed: Recent Labs    05/30/23 0739 09/07/23 0718  NA 139 140  K 4.2 4.5  CL 101 104  CO2 26 24  GLUCOSE 164* 135*  BUN 15 18  CREATININE 0.84 0.77  CALCIUM  10.1 9.3   Recent Labs    05/30/23 0739 09/07/23 0718  AST 20 14  ALT 14 12  BILITOT 0.9 0.8  PROT 7.4 6.6   Recent Labs    05/30/23 0739 09/07/23 0718  WBC 11.5* 8.1  NEUTROABS 3,772 2,981  HGB 14.4 13.1  HCT 42.8 38.9  MCV 91.5 91.3  PLT 313 267   Lab Results  Component Value Date   TSH 3.05 10/25/2022   Lab Results  Component Value Date   HGBA1C 6.8 (H) 01/16/2024   Lab Results  Component Value Date   CHOL 233 (H) 05/30/2023   HDL 63 05/30/2023   LDLCALC 126 (H) 05/30/2023   LDLDIRECT 133 (H) 07/14/2014   TRIG 288 (H) 05/30/2023   CHOLHDL 3.7 05/30/2023    Significant  Diagnostic Results in last 30 days:  MR Pelvis W Wo Contrast Result Date: 01/29/2024 CLINICAL DATA:  Left lower back mass EXAM: MRI PELVIS WITHOUT AND WITH CONTRAST TECHNIQUE: Multiplanar multisequence MR imaging of the pelvis was performed both before and after administration of intravenous contrast. CONTRAST:  7mL GADAVIST  GADOBUTROL  1 MMOL/ML IV SOLN COMPARISON:  CT abdomen pelvis, 06/10/2014 FINDINGS: Urinary Tract:  No abnormality visualized. Bowel:  Sigmoid diverticulosis. Vascular/Lymphatic: No pathologically enlarged lymph nodes. No significant vascular abnormality seen. Reproductive: Small uterine fibroids. Hemorrhagic or proteinaceous fluid in the lower endometrial cavity and endocervical canal, possibly a cyst, measuring 2.7 x 2.0 cm (series 8, image 36,  series 6, image 25). Other: Superficial heterogeneously contrast enhancing mass in the subcutaneous left buttock measuring 6.4 x 3.5 cm (series 8, image 15). Fat containing umbilical hernia, incompletely imaged. Small fat containing bilateral inguinal hernias. Musculoskeletal: No suspicious bone lesions identified. IMPRESSION: 1. Superficial heterogeneously contrast enhancing mass in the subcutaneous left buttock measuring 6.4 x 3.5 cm. This is new compared to prior examination dated 2015 of uncertain nature; neither clearly benign nor malignant. Consider tissue sampling or excision if clinically appropriate given very advanced patient age. 2. No evidence of lymphadenopathy or metastatic disease in the pelvis. 3. Hemorrhagic or proteinaceous fluid in the lower endometrial cavity and endocervical canal, possibly a cyst, measuring 2.7 x 2.0 cm. This is probably new or at least increased compared to remote prior examination dated 06/10/2014. Consider pelvic ultrasound for further evaluation if clinically appropriate given very advanced patient age. 4. Small uterine fibroids. 5. Sigmoid diverticulosis. Electronically Signed   By: Marolyn JONETTA Jaksch M.D.   On: 01/29/2024 17:12    Assessment/Plan  Palpable mass of lower back   Left lower back lump, left, size of a large egg, progressing, sore to touch, getting bigger.    MR 01/29/24  Superficial heterogeneously contrast enhancing mass in the subcutaneous left buttock measuring 6.4 x 3.5 cm. This is new compared to prior examination dated 2015 of uncertain nature; neither clearly benign nor malignant. Consider tissue sampling or excision if clinically appropriate given very advanced patient age.  Referral to surgery mede.   Diabetes mellitus type 2, controlled (HCC) restart Metformin  500mg  qd, last Hgb a1c 6.5 11/19/20<<7.2 07/15/22<<8.1 05/29/24  Hypertension , blood pressure is controlled on Lisinopril , Atorvastatin , Bun/creat 18/0.77 09/07/23   Family/ staff  Communication: Plan of care reviewed with the patient  Labs/tests ordered: None

## 2024-02-22 NOTE — Patient Instructions (Addendum)
 1.)Visit your local pharmacy to receive your shingles and covid boosters, if you believe you are already up-to-date, review your personal records and share this information with us  at your next appointment   2.) Contact [facility] to schedule an appointment to follow-up on MRI results: [phone]  [facility] [address] [place], [place] [address]

## 2024-03-05 DIAGNOSIS — Z961 Presence of intraocular lens: Secondary | ICD-10-CM | POA: Diagnosis not present

## 2024-03-05 DIAGNOSIS — H353211 Exudative age-related macular degeneration, right eye, with active choroidal neovascularization: Secondary | ICD-10-CM | POA: Diagnosis not present

## 2024-03-05 DIAGNOSIS — H35033 Hypertensive retinopathy, bilateral: Secondary | ICD-10-CM | POA: Diagnosis not present

## 2024-03-05 DIAGNOSIS — H353123 Nonexudative age-related macular degeneration, left eye, advanced atrophic without subfoveal involvement: Secondary | ICD-10-CM | POA: Diagnosis not present

## 2024-03-05 DIAGNOSIS — H43813 Vitreous degeneration, bilateral: Secondary | ICD-10-CM | POA: Diagnosis not present

## 2024-03-05 DIAGNOSIS — E119 Type 2 diabetes mellitus without complications: Secondary | ICD-10-CM | POA: Diagnosis not present

## 2024-03-19 DIAGNOSIS — B3783 Candidal cheilitis: Secondary | ICD-10-CM | POA: Diagnosis not present

## 2024-03-25 ENCOUNTER — Telehealth: Payer: Self-pay

## 2024-03-25 NOTE — Telephone Encounter (Signed)
 Patient call this afternoon in regard of the MRI that was done on 01/29/24, Patient did explain that she saw Mast, Man X, NP on 02/22/24 stating the referral that was done on 02/01/24 that no one has gotten in contact with the patient and she wanted an update.  Please Advise  Message sent to Mast, Man X, NP and Sherren Tobias BIRCH

## 2024-04-02 ENCOUNTER — Other Ambulatory Visit: Payer: Self-pay | Admitting: Nurse Practitioner

## 2024-04-02 DIAGNOSIS — R222 Localized swelling, mass and lump, trunk: Secondary | ICD-10-CM

## 2024-04-08 ENCOUNTER — Other Ambulatory Visit: Payer: Self-pay | Admitting: Internal Medicine

## 2024-04-15 ENCOUNTER — Telehealth: Payer: Self-pay

## 2024-04-15 NOTE — Telephone Encounter (Signed)
 Copied from CRM [id number]. Topic: Clinical - Medical Advice >> Apr 15, 2024  8:47 AM Diannia H wrote: Reason for CRM: Patient was calling because she has a DG BONE DENSITY (DXA) (Order [id number]) order that has now expired but was not complete. She is wanting to know when this would happen. She also wants to come in clinic to discuss the results of the MRI because she was informed that she would have to do another test. Could you assist? Patient callback number is [phone].

## 2024-04-15 NOTE — Telephone Encounter (Signed)
 1.) Spoke with patient and she stated she does not have any questions about a bone density. I explained to patient that she had an order placed for bone density and she declined. Patient stated  I'm 89 years old and I don't need a bone density  2.) Patient states Verneita, Independent Nurse at [facility], called the surgeon office and they have her scheduled for 04/26/24, however patient was suppose to see them today but there was some issues with the transportation services at [facility].  Patient did not have any additional questions or concerns at this time

## 2024-04-23 DIAGNOSIS — B3783 Candidal cheilitis: Secondary | ICD-10-CM | POA: Diagnosis not present

## 2024-04-23 DIAGNOSIS — L820 Inflamed seborrheic keratosis: Secondary | ICD-10-CM | POA: Diagnosis not present

## 2024-04-26 ENCOUNTER — Ambulatory Visit: Payer: Self-pay | Admitting: Surgery

## 2024-04-26 DIAGNOSIS — K429 Umbilical hernia without obstruction or gangrene: Secondary | ICD-10-CM | POA: Diagnosis not present

## 2024-04-26 DIAGNOSIS — D171 Benign lipomatous neoplasm of skin and subcutaneous tissue of trunk: Secondary | ICD-10-CM | POA: Diagnosis not present

## 2024-04-26 NOTE — H&P (Signed)
 Subjective   Chief Complaint: Soft Tissue Mass     History of Present Illness: Catherine Ellis is a 89 y.o. female who is seen today as an office consultation at the request of Dr. Adline for evaluation of Soft Tissue Mass .   This is a 89 year old female who is a [facility] at [facility] who presents with several years of a palpable mass in her left buttock just underneath the skin.  Recently, this has enlarged significantly and is causing discomfort.  She underwent MRI of this area that revealed a 6-1/2 cm subcutaneous mass.  Radiology cannot clearly define whether this is benign or malignant.  No sign of lymphadenopathy.  The patient has also had many years of an umbilical hernia.  This has become fairly large.  This was partially visualized on the MRI of the pelvis.  At that time, there did not seem to be any bowel involvement but there is a significant mount of fatty tissue within the hernia sac.  The fascial defect is estimated at 5 cm.  The patient denies any persistent nausea or vomiting.   Review of Systems: A complete review of systems was obtained from the patient.  I have reviewed this information and discussed as appropriate with the patient.  See HPI as well for other ROS.  Review of Systems  Constitutional: Negative.   HENT:  Positive for congestion and tinnitus.   Eyes:  Positive for blurred vision.  Respiratory: Negative.    Cardiovascular: Negative.   Gastrointestinal:  Positive for abdominal pain.  Genitourinary: Negative.   Musculoskeletal: Negative.   Skin:  Positive for itching.  Neurological:  Positive for dizziness.  Endo/Heme/Allergies: Negative.   Psychiatric/Behavioral:  The patient is nervous/anxious.       Medical History: Past Medical History:  Diagnosis Date   Diabetes mellitus (CMS/HHS-HCC)    High cholesterol    Hypertension     Patient Active Problem List  Diagnosis   Diabetes mellitus type 2, controlled (CMS/HHS-HCC)   Acute  bronchitis   Acute gastritis   Atrophic vaginitis   Chronic maxillary sinusitis   Depression with anxiety   Essential hypertension   GERD (gastroesophageal reflux disease)   Hypercalcemia   Hyperlipidemia   Hyponatremia   Impairment of balance   Weakness   Memory loss   Microhematuria   Mild cognitive impairment   Seasonal affective disorder ()   Umbilical hernia without obstruction or gangrene   Lipoma of torso    Past Surgical History:  Procedure Laterality Date   EXTRACTION CATARACT INTRACAPSULAR W/INSERTION INTRAOCULAR PROSTHESIS Bilateral      No Known Allergies  Current Outpatient Medications on File Prior to Visit  Medication Sig Dispense Refill   atorvastatin  (LIPITOR) 10 MG tablet Take 10 mg by mouth once daily     CALCIUM -MAGNESIUM-VITAMIN D2 ORAL Take 1 tablet by mouth once daily     lisinopriL  (ZESTRIL ) 10 MG tablet Take 10 mg by mouth once daily     magnesium gluconate (MAG-G) 27 mg (500 mg) tablet Take 500 mg by mouth once daily     metFORMIN  (GLUCOPHAGE ) 500 MG tablet take 1 tablet (500 mg total) by mouth every other day.     omeprazole  (PRILOSEC) 20 MG DR capsule Take 1 capsule by mouth once daily     triamcinolone  0.5 % ointment Apply 1 Application topically 2 (two) times daily     vitamin E 400 UNIT capsule Take 400 Units by mouth once daily  ascorbic acid, vitamin C, (VITAMIN C) 500 MG tablet Take 500 mg by mouth once daily     cholecalciferol (VITAMIN D3) 2,000 unit tablet Take 1 tablet by mouth once daily     cyanocobalamin , vitamin B-12, (VITAMIN B-12) 5,000 mcg Subl Place under the tongue     magnesium oxide (MAG-OX) 400 mg (241.3 mg magnesium) tablet Take 400 mg by mouth once daily     omega-3-dha-epa-fish oil 300-1,000 mg capsule Take 2 g by mouth once daily     oxyBUTYnin  (DITROPAN -XL) 10 MG XL tablet Take 1 tablet by mouth at bedtime     traZODone  (DESYREL ) 50 MG tablet Take by mouth     vitamins A,C,E-zinc-copper 2,148 mcg-113 mg-45 mg-17.4mg   Tab Take 1 tablet by mouth 2 (two) times daily     No current facility-administered medications on file prior to visit.    Family History  Problem Relation Age of Onset   Heart disease Father    Macular degeneration Sister      Social History   Tobacco Use  Smoking Status Never  Smokeless Tobacco Never     Social History   Socioeconomic History   Marital status: Single  Tobacco Use   Smoking status: Never   Smokeless tobacco: Never  Substance and Sexual Activity   Alcohol  use: Never   Drug use: Never   Social Drivers of Corporate investment banker Strain: Low Risk  (06/09/2017)   Received from [facility]   Overall Financial Resource Strain (CARDIA)    Difficulty of Paying Living Expenses: Not hard at all  Food Insecurity: No Food Insecurity (06/09/2017)   Received from [facility]   Hunger Vital Sign    Worried About Running Out of Food in the Last Year: Never true    Ran Out of Food in the Last Year: Never true  Transportation Needs: No Transportation Needs (06/09/2017)   Received from [facility] - Transportation    Lack of Transportation (Medical): No    Lack of Transportation (Non-Medical): No  Physical Activity: Inactive (06/09/2017)   Received from [facility]   Exercise Vital Sign    Days of Exercise per Week: 0 days    Minutes of Exercise per Session: 0 min  Stress: No Stress Concern Present (06/09/2017)   Received from [facility] - Occupational Stress Questionnaire    Feeling of Stress : Only a little  Social Connections: Moderately Integrated (06/09/2017)   Received from [facility]   Social Connection and Isolation Panel    Frequency of Communication with Friends and Family: More than three times a week    Frequency of Social Gatherings with Friends and Family: More than three times a week    Attends Religious Services: 1 to 4 times per year    Active Member of Clubs or Organizations: Yes     Attends Banker Meetings: Never    Marital Status: Never married  Housing Stability: Unknown (04/26/2024)   Housing Stability Vital Sign    Homeless in the Last Year: No    Objective:    Vitals:   04/26/24 1112 04/26/24 1114  BP: (!) 183/78   Pulse: 89   Temp: 36.8 C (98.3 F)   SpO2: 98%   Weight: 73.9 kg (163 lb)   Height: 160 cm (5' 3)   PainSc:  0-No pain    Body mass index is 28.87 kg/m.  Physical Exam  Constitutional:  WDWN in NAD, conversant, no obvious deformities; lying in bed comfortably Eyes:  Pupils equal, round; sclera anicteric; moist conjunctiva; no lid lag HENT:  Oral mucosa moist; good dentition  Neck:  No masses palpated, trachea midline; no thyromegaly Lungs:  CTA bilaterally; normal respiratory effort CV:  Regular rate and rhythm; no murmurs; extremities well-perfused with no edema Abd:  +bowel sounds, soft, non-tender, no palpable organomegaly; protruding large umbilical hernia.  When she is supine and relaxed this is partially reducible.  The fascial defect is at least 5 cm in diameter.  The hernia sac is approximately 7 cm in diameter. Musc: Unsteady gait, ambulates with a cane; no apparent clubbing or cyanosis in extremities Lymphatic:  No palpable cervical or axillary lymphadenopathy Left buttock in the subcutaneous tissues there is a large firm protruding mass.  This is smooth and mobile.  It is approximately 7 cm in diameter.  No overlying skin changes.  No erythema or induration.  Mildly tender to palpation. Skin:  Warm, dry; no sign of jaundice Psychiatric - alert and oriented x 4; calm mood and affect   Labs, Imaging and Diagnostic Testing: CLINICAL DATA:  Left lower back mass   EXAM: MRI PELVIS WITHOUT AND WITH CONTRAST   TECHNIQUE: Multiplanar multisequence MR imaging of the pelvis was performed both before and after administration of intravenous contrast.   CONTRAST:  7mL GADAVIST  GADOBUTROL  1 MMOL/ML IV SOLN    COMPARISON:  CT abdomen pelvis, 06/10/2014   FINDINGS: Urinary Tract:  No abnormality visualized.   Bowel:  Sigmoid diverticulosis.   Vascular/Lymphatic: No pathologically enlarged lymph nodes. No significant vascular abnormality seen.   Reproductive: Small uterine fibroids. Hemorrhagic or proteinaceous fluid in the lower endometrial cavity and endocervical canal, possibly a cyst, measuring 2.7 x 2.0 cm (series 8, image 36, series 6, image 25).   Other: Superficial heterogeneously contrast enhancing mass in the subcutaneous left buttock measuring 6.4 x 3.5 cm (series 8, image 15). Fat containing umbilical hernia, incompletely imaged. Small fat containing bilateral inguinal hernias.   Musculoskeletal: No suspicious bone lesions identified.   IMPRESSION: 1. Superficial heterogeneously contrast enhancing mass in the subcutaneous left buttock measuring 6.4 x 3.5 cm. This is new compared to prior examination dated 2015 of uncertain nature; neither clearly benign nor malignant. Consider tissue sampling or excision if clinically appropriate given very advanced patient age. 2. No evidence of lymphadenopathy or metastatic disease in the pelvis. 3. Hemorrhagic or proteinaceous fluid in the lower endometrial cavity and endocervical canal, possibly a cyst, measuring 2.7 x 2.0 cm. This is probably new or at least increased compared to remote prior examination dated 06/10/2014. Consider pelvic ultrasound for further evaluation if clinically appropriate given very advanced patient age. 4. Small uterine fibroids. 5. Sigmoid diverticulosis.     Electronically Signed   By: Marolyn JONETTA Jaksch M.D.   On: 01/29/2024 17:12  Assessment and Plan:  Diagnoses and all orders for this visit:  Umbilical hernia without obstruction or gangrene  Lipoma of torso    Recommend excision of the subcutaneous mass of the left buttock.  This likely represents a subcutaneous lipoma, although we will send it  for definitive pathology when we have excised it.  I also recommend repair of the umbilical hernia as it seems to be getting larger and is at risk for incarceration or strangulation.  Despite her advanced age, the patient is still fairly active.  We discussed both procedures and she is agreeable to proceed.  Recommend excision of subcutaneous  mass of the left buttock and umbilical hernia repair with mesh.The surgical procedure has been discussed with the patient.  Potential risks, benefits, alternative treatments, and expected outcomes have been explained.  All of the patient's questions at this time have been answered.  The likelihood of reaching the patient's treatment goal is good.  The patient understands the proposed surgical procedure and wishes to proceed.   DONNICE DEWAYNE LIMA, MD  04/26/2024 12:13 PM

## 2024-05-08 NOTE — Patient Instructions (Signed)
 SURGICAL WAITING ROOM VISITATION  Patients having surgery or a procedure may have no more than 2 support people in the waiting area - these visitors may rotate.    Children under the age of 51 must have an adult with them who is not the patient.  Visitors with respiratory illnesses are discouraged from visiting and should remain at home.  If the patient needs to stay at the hospital during part of their recovery, the visitor guidelines for inpatient rooms apply. Pre-op nurse will coordinate an appropriate time for 1 support person to accompany patient in pre-op.  This support person may not rotate.    Please refer to the [facility] website for the visitor guidelines for Inpatients (after your surgery is over and you are in a regular room).       Your procedure is scheduled on: 05/16/24   Report to [facility] Main Entrance    Report to admitting at 12:45 PM   Call this number if you have problems the morning of surgery [phone]   Do not eat food :After Midnight.   After Midnight you may have the following liquids until 12 noon DAY OF SURGERY  Water Non-Citrus Juices (without pulp, NO RED-Apple, White grape, White cranberry) Black Coffee (NO MILK/CREAM OR CREAMERS, sugar ok)  Clear Tea (NO MILK/CREAM OR CREAMERS, sugar ok) regular and decaf                             Plain Jell-O (NO RED)                                           Fruit ices (not with fruit pulp, NO RED)                                     Popsicles (NO RED)                                                               Sports drinks like Gatorade (NO RED)                 Oral Hygiene is also important to reduce your risk of infection.                                    Remember - BRUSH YOUR TEETH THE MORNING OF SURGERY WITH YOUR REGULAR TOOTHPASTE  DENTURES WILL BE REMOVED PRIOR TO SURGERY PLEASE DO NOT APPLY Poly grip OR ADHESIVES!!!     Stop all vitamins and herbal supplements 7 days before  surgery.   Take these medicines the morning of surgery with A SIP OF WATER: Atorvastatin                Do not take lisinopril  the morning of surgery.   DO NOT TAKE ANY ORAL DIABETIC MEDICATIONS DAY OF YOUR SURGERY Hold Metformin  the morning of surgery.              You may  not have any metal on your body including hair pins, jewelry, and body piercing             Do not wear make-up, lotions, powders, perfumes/cologne, or deodorant  Do not wear nail polish including gel and S&S, artificial/acrylic nails, or any other type of covering on natural nails including finger and toenails. If you have artificial nails, gel coating, etc. that needs to be removed by a nail salon please have this removed prior to surgery or surgery may need to be canceled/ delayed if the surgeon/ anesthesia feels like they are unable to be safely monitored.   Do not shave  48 hours prior to surgery.             Do not bring valuables to the hospital. [facility] IS NOT             RESPONSIBLE   FOR VALUABLES.   Contacts, glasses, dentures or bridgework may not be worn into surgery.   Bring small overnight bag day of surgery.   DO NOT BRING YOUR HOME MEDICATIONS TO THE HOSPITAL. PHARMACY WILL DISPENSE MEDICATIONS LISTED ON YOUR MEDICATION LIST TO YOU DURING YOUR ADMISSION IN THE HOSPITAL!    Patients discharged on the day of surgery will not be allowed to drive home.  Someone NEEDS to stay with you for the first 24 hours after anesthesia.   Special Instructions: Bring a copy of your healthcare power of attorney and living will documents the day of surgery if you haven't scanned them before.              Please read over the following fact sheets you were given: IF YOU HAVE QUESTIONS ABOUT YOUR PRE-OP INSTRUCTIONS PLEASE CALL [phone] [place]   If you received a COVID test during your pre-op visit  it is requested that you wear a mask when out in public, stay away from anyone that may not be feeling well and  notify your surgeon if you develop symptoms. If you test positive for Covid or have been in contact with anyone that has tested positive in the last 10 days please notify you surgeon.    [place] - Preparing for Surgery Before surgery, you can play an important role.  Because skin is not sterile, your skin needs to be as free of germs as possible.  You can reduce the number of germs on your skin by washing with CHG (chlorahexidine gluconate) soap before surgery.  CHG is an antiseptic cleaner which kills germs and bonds with the skin to continue killing germs even after washing. Please DO NOT use if you have an allergy to CHG or antibacterial soaps.  If your skin becomes reddened/irritated stop using the CHG and inform your nurse when you arrive at Short Stay. Do not shave (including legs and underarms) for at least 48 hours prior to the first CHG shower.  You may shave your face/neck.  Please follow these instructions carefully:  1.  Shower with CHG Soap the night before surgery ONLY (DO NOT USE THE SOAP THE MORNING OF SURGERY).  2.  If you choose to wash your hair, wash your hair first as usual with your normal  shampoo.  3.  After you shampoo, rinse your hair and body thoroughly to remove the shampoo.                             4.  Use CHG as you  would any other liquid soap.  You can apply chg directly to the skin and wash.  Gently with a scrungie or clean washcloth.  5.  Apply the CHG Soap to your body ONLY FROM THE NECK DOWN.   Do   not use on face/ open                           Wound or open sores. Avoid contact with eyes, ears mouth and   genitals (private parts).                       Wash face,  Genitals (private parts) with your normal soap.             6.  Wash thoroughly, paying special attention to the area where your    surgery  will be performed.  7.  Thoroughly rinse your body with warm water from the neck down.  8.  DO NOT shower/wash with your normal soap after using and  rinsing off the CHG Soap.                9.  Pat yourself dry with a clean towel.            10.  Wear clean pajamas.            11.  Place clean sheets on your bed the night of your first shower and do not  sleep with pets. Day of Surgery : Do not apply any CHG, lotions/deodorants the morning of surgery.  Please wear clean clothes to the hospital/surgery center.  FAILURE TO FOLLOW THESE INSTRUCTIONS MAY RESULT IN THE CANCELLATION OF YOUR SURGERY  PATIENT SIGNATURE_________________________________  NURSE SIGNATURE__________________________________  ________________________________________________________________________

## 2024-05-08 NOTE — Progress Notes (Signed)
 COVID Vaccine received:  []  No []  Yes Date of any COVID positive Test in last 90 days:  PCP - Man Mast NP Cardiologist -   Chest x-ray -  EKG -   Stress Test -  ECHO -  Cardiac Cath -   Bowel Prep - []  No  []   Yes ______  Pacemaker / ICD device []  No []  Yes   Spinal Cord Stimulator:[]  No []  Yes       History of Sleep Apnea? []  No []  Yes   CPAP used?- []  No []  Yes    Does the patient monitor blood sugar?          []  No []  Yes  []  N/A  Patient has: []  NO Hx DM   []  Pre-DM                 []  DM1  []   DM2 Does patient have a [employer] or Dexacom? []  No []  Yes   Fasting Blood Sugar Ranges-  Checks Blood Sugar _____ times a day  GLP1 agonist / usual dose -  GLP1 instructions:  SGLT-2 inhibitors / usual dose -  SGLT-2 instructions:   Blood Thinner / Instructions: Aspirin Instructions:  Comments:   Activity level: Patient is able / unable to climb a flight of stairs without difficulty; []  No CP  []  No SOB, but would have ___   Patient can / can not perform ADLs without assistance.   Anesthesia review:   Patient denies shortness of breath, fever, cough and chest pain at PAT appointment.  Patient verbalized understanding and agreement to the Pre-Surgical Instructions that were given to them at this PAT appointment. Patient was also educated of the need to review these PAT instructions again prior to his/her surgery.I reviewed the appropriate phone numbers to call if they have any and questions or concerns.

## 2024-05-10 ENCOUNTER — Encounter (HOSPITAL_COMMUNITY)
Admission: RE | Admit: 2024-05-10 | Discharge: 2024-05-10 | Disposition: A | Discharge status: Home / Self Care | Source: Ambulatory Visit | Attending: Anesthesiology | Admitting: Anesthesiology

## 2024-05-10 DIAGNOSIS — I10 Essential (primary) hypertension: Secondary | ICD-10-CM

## 2024-05-10 DIAGNOSIS — Z01818 Encounter for other preprocedural examination: Secondary | ICD-10-CM

## 2024-05-10 DIAGNOSIS — E1122 Type 2 diabetes mellitus with diabetic chronic kidney disease: Secondary | ICD-10-CM

## 2024-05-16 ENCOUNTER — Ambulatory Visit (HOSPITAL_COMMUNITY): Admit: 2024-05-16 | Admitting: Surgery

## 2024-05-16 SURGERY — EXCISION MASS LOWER EXTREMITIES
Anesthesia: General

## 2024-05-28 DIAGNOSIS — Z961 Presence of intraocular lens: Secondary | ICD-10-CM | POA: Diagnosis not present

## 2024-05-28 DIAGNOSIS — E119 Type 2 diabetes mellitus without complications: Secondary | ICD-10-CM | POA: Diagnosis not present

## 2024-05-28 DIAGNOSIS — H43813 Vitreous degeneration, bilateral: Secondary | ICD-10-CM | POA: Diagnosis not present

## 2024-05-28 DIAGNOSIS — H35033 Hypertensive retinopathy, bilateral: Secondary | ICD-10-CM | POA: Diagnosis not present

## 2024-05-28 DIAGNOSIS — H353211 Exudative age-related macular degeneration, right eye, with active choroidal neovascularization: Secondary | ICD-10-CM | POA: Diagnosis not present

## 2024-05-28 DIAGNOSIS — H353123 Nonexudative age-related macular degeneration, left eye, advanced atrophic without subfoveal involvement: Secondary | ICD-10-CM | POA: Diagnosis not present

## 2024-06-05 ENCOUNTER — Ambulatory Visit: Admitting: Family

## 2024-06-05 ENCOUNTER — Encounter: Payer: Self-pay | Admitting: Family

## 2024-06-05 VITALS — BP 126/76 | HR 90 | Temp 98.0°F | Resp 18 | Ht 63.0 in | Wt 164.6 lb

## 2024-06-05 DIAGNOSIS — R21 Rash and other nonspecific skin eruption: Secondary | ICD-10-CM | POA: Diagnosis not present

## 2024-06-05 DIAGNOSIS — Z7984 Long term (current) use of oral hypoglycemic drugs: Secondary | ICD-10-CM | POA: Diagnosis not present

## 2024-06-05 DIAGNOSIS — E1121 Type 2 diabetes mellitus with diabetic nephropathy: Secondary | ICD-10-CM

## 2024-06-05 MED ORDER — METFORMIN HCL 500 MG PO TABS: 500.0000 mg | ORAL_TABLET | Freq: Every day | ORAL | 3 refills | Status: AC

## 2024-06-05 MED ORDER — PREDNISONE 20 MG PO TABS: ORAL_TABLET | ORAL | 0 refills | Status: AC

## 2024-06-05 NOTE — Patient Instructions (Signed)
-   Take Metformin  500 mg tablet one by mouth daily x 5 days while on Prednisolone then Metformin  500 mg tablet every other day when prednisolone is completed.

## 2024-06-09 NOTE — Progress Notes (Signed)
 Provider: Roxan Plough FNP-C  Mast, Man X, NP  Patient Care Team: Mast, Man X, NP as PCP - General (Internal Medicine) Mast, Man X, NP as Nurse Practitioner (Internal Medicine) Josh Server, MD as Referring Physician (Ophthalmology)  Extended Emergency Contact Information Primary Emergency Contact: [name],[name]  [place] Home Phone: [phone] Mobile Phone: [phone] Relation: Niece  Code Status:  Full Code Goals of care: Advanced Directive information    06/05/2024    9:51 AM  Advanced Directives  Does Patient Have a Medical Advance Directive? No  Would patient like information on creating a medical advance directive? No - Patient declined     Chief Complaint  Patient presents with   Rash   Discussed the use of AI scribe software for clinical note transcription with the patient, who gave verbal consent to proceed.  History of Present Illness   Catherine Ellis is a 89 year old female who presents with a rash and itching on her chest and underarms.  She has been experiencing a rash that initially appeared on her face and was treated by a dermatologist who burned it off. However, the rash did not resolve and subsequently spread to her underarms and chest. The rash is particularly bothersome at night when it itches and burns.  The rash has been present for about a week, with significant worsening over the past weekend. It is primarily located under her arms and on her right upper chest, with occasional spots on her back that she cannot reach to apply cream. No fluid or drainage is noted from the rash.  She has been using a cream recommended by her cousin, obtained online, but it has not provided relief. Initially, she tried Vaseline as suggested by a nurse, but it was ineffective.  No fever, chills, or significant cough. She mentions a history of bronchial issues in her family. She has not noticed any changes in her lotion, soap, or detergent that  could have triggered the rash.  Her current medications include calcium  supplements, which she takes regularly. She does not monitor her blood sugar levels due to difficulty in doing so, and she mentions that a nurse used to assist her with this task.   Past Medical History:  Diagnosis Date   Allergy    Arthritis    Diabetes mellitus    Hernia, abdominal    Hyperlipidemia    Hypertension    Osteopenia, senile 09/26/2012   T score - 1.6 Left femur   Past Surgical History:  Procedure Laterality Date   APPENDECTOMY     BACK SURGERY     1965   DENTAL SURGERY  01/17/2024   EYE SURGERY     GUM SURGERY     KNEE SURGERY      No Known Allergies  Outpatient Encounter Medications as of 06/05/2024  Medication Sig   atorvastatin  (LIPITOR) 10 MG tablet TAKE ONE TABLET BY MOUTH ONCE DAILY   Calcium -Magnesium-Vitamin D  (CALCIUM  1200+D3 PO) Take 1 tablet by mouth daily.   cyanocobalamin  (VITAMIN B12) 1000 MCG tablet Take 1,000 mcg by mouth daily.   hydrocortisone  2.5%-Eucerin equivalent 1:1 cream mixture Apply topically 2 (two) times daily.   lisinopril  (ZESTRIL ) 10 MG tablet TAKE ONE TABLET BY MOUTH DAILY   magnesium gluconate (MAGONATE) 500 (27 Mg) MG TABS tablet Take 500 mg by mouth daily.   Multiple Vitamins-Minerals (PRESERVISION AREDS 2+MULTI VIT PO) Take 1 tablet by mouth 2 (two) times daily.   predniSONE  (DELTASONE ) 20 MG tablet  Take 2 tablets (40 mg total) by mouth daily with breakfast for 1 day, THEN 1.5 tablets (30 mg total) daily with breakfast for 1 day, THEN 1 tablet (20 mg total) daily with breakfast for 1 day, THEN 0.5 tablets (10 mg total) daily with breakfast for 1 day.   vitamin C (ASCORBIC ACID) 500 MG tablet Take 500 mg by mouth daily.   Vitamin D -Vitamin K (VITAMIN K2-VITAMIN D3 PO) Take by mouth daily.   vitamin E 180 MG (400 UNITS) capsule Take 400 Units by mouth daily.   [DISCONTINUED] metFORMIN  (GLUCOPHAGE ) 500 MG tablet Take 1 tablet (500 mg total) by mouth every  other day.   metFORMIN  (GLUCOPHAGE ) 500 MG tablet Take 1 tablet (500 mg total) by mouth daily with breakfast. For 5 days while on prednisone  then resume every other day.   [DISCONTINUED] Calcium  Carbonate-Vitamin D3 600-400 MG-UNIT TABS Take 1 tablet by mouth 2 (two) times daily. (Patient not taking: Reported on 06/05/2024)   [DISCONTINUED] Cholecalciferol (D3 5000) 125 MCG (5000 UT) capsule Take 5,000 Units by mouth daily. (Patient not taking: Reported on 06/05/2024)   [DISCONTINUED] triamcinolone  ointment (KENALOG ) 0.5 % Apply 1 Application topically 2 (two) times daily. (Patient not taking: Reported on 06/05/2024)   Facility-Administered Encounter Medications as of 06/05/2024  Medication   0.9 %  sodium chloride  infusion    Review of Systems  Constitutional:  Negative for appetite change, chills, fatigue, fever and unexpected weight change.  HENT:  Positive for hearing loss. Negative for congestion, dental problem, ear discharge, ear pain, facial swelling, nosebleeds, postnasal drip, rhinorrhea, sinus pressure, sinus pain, sneezing, sore throat, tinnitus and trouble swallowing.   Eyes:  Negative for pain, discharge, redness, itching and visual disturbance.  Respiratory:  Negative for cough, chest tightness, shortness of breath and wheezing.   Cardiovascular:  Negative for chest pain, palpitations and leg swelling.  Gastrointestinal:  Negative for abdominal distention, abdominal pain, diarrhea, nausea and vomiting.  Genitourinary:  Negative for difficulty urinating, dysuria, flank pain, frequency and urgency.  Musculoskeletal:  Positive for gait problem. Negative for arthralgias, back pain, joint swelling, myalgias, neck pain and neck stiffness.  Skin:  Positive for rash. Negative for color change, pallor and wound.  Neurological:  Negative for dizziness, weakness, light-headedness, numbness and headaches.    Immunization History  Administered Date(s) Administered   INFLUENZA, HIGH DOSE  SEASONAL PF 04/24/2019, 04/22/2020, 05/10/2023   Influenza Split 04/13/2012   Influenza Whole 04/12/2018   Influenza,inj,Quad PF,6+ Mos 06/14/2013, 04/14/2014, 04/27/2015   Influenza-Unspecified 04/10/2016   Moderna Covid-19 Vaccine Bivalent Booster 41yrs & up 12/08/2020   Moderna Sars-Covid-2 Vaccination 07/15/2019, 08/12/2019, 05/19/2020   PPD Test 12/16/2011, 12/16/2011   Pneumococcal Conjugate-13 07/14/2014   Pneumococcal Polysaccharide-23 01/04/2010   Tdap 12/16/2011, 03/14/2023   Zoster Recombinant(Shingrix) 03/14/2023   Pertinent  Health Maintenance Due  Topic Date Due   Influenza Vaccine  02/09/2024   FOOT EXAM  03/01/2024   HEMOGLOBIN A1C  07/18/2024   OPHTHALMOLOGY EXAM  03/05/2025   Bone Density Scan  Completed      06/29/2023    1:58 PM 07/13/2023    1:24 PM 09/14/2023    1:08 PM 01/18/2024    2:46 PM 06/05/2024    9:50 AM  Fall Risk  Falls in the past year? 0 1 1 1  0  Was there an injury with Fall? 0 0 0 0 0  Fall Risk Category Calculator 0 1 1 1  0  Patient at Risk for Falls Due to  No Fall Risks No Fall Risks  Fall risk Follow up    Falls evaluation completed Falls evaluation completed   Functional Status Survey:    Vitals:   06/05/24 0957  BP: 126/76  Pulse: 90  Resp: 18  Temp: 98 F (36.7 C)  SpO2: 94%  Weight: 164 lb 9.6 oz (74.7 kg)  Height: 5' 3 (1.6 m)   Body mass index is 29.16 kg/m. Physical Exam VITALS: T- 98.0, P- 90, BP- 126/76, SaO2- 94% GENERAL: Alert, cooperative, well developed, no acute distress. HEENT: Normocephalic, normal oropharynx, moist mucous membranes. CHEST: Clear to auscultation bilaterally, no wheezes, rhonchi, or crackles. CARDIOVASCULAR: Normal heart rate and rhythm, S1 and S2 normal without murmurs. ABDOMEN: Soft, non-tender, non-distended, without organomegaly, normal bowel sounds. EXTREMITIES: No cyanosis or edema. NEUROLOGICAL: Cranial nerves grossly intact, moves all extremities without gross motor or sensory  deficit. SKIN: Rash on right upper chest and under left arm, rash under both armpits worse on chest, rash blanches on pressure, no rash on back or legs.  SKIN: No rash,no lesion or erythema   PSYCHIATRY/BEHAVIORAL: Mood stable   Labs reviewed: Recent Labs    09/07/23 0718  NA 140  K 4.5  CL 104  CO2 24  GLUCOSE 135*  BUN 18  CREATININE 0.77  CALCIUM  9.3   Recent Labs    09/07/23 0718  AST 14  ALT 12  BILITOT 0.8  PROT 6.6   Recent Labs    09/07/23 0718  WBC 8.1  NEUTROABS 2,981  HGB 13.1  HCT 38.9  MCV 91.3  PLT 267   Lab Results  Component Value Date   TSH 3.05 10/25/2022   Lab Results  Component Value Date   HGBA1C 6.8 (H) 01/16/2024   Lab Results  Component Value Date   CHOL 233 (H) 05/30/2023   HDL 63 05/30/2023   LDLCALC 126 (H) 05/30/2023   LDLDIRECT 133 (H) 07/14/2014   TRIG 288 (H) 05/30/2023   CHOLHDL 3.7 05/30/2023    Significant Diagnostic Results in last 30 days:  No results found.  Assessment/Plan  Rash with itching and burning, right chest and bilateral axillae Acute rash with itching and burning on the right chest and bilateral axillae, worsening over the past week. No fluid or drainage. Shingles is unlikely given the atypical distribution. No fever or chills. Rash is red and disappears upon pressure, indicating no fluid accumulation. - Prescribed prednisone  for 5 days to reduce inflammation and itching. - Instructed to take prednisone  with food in the morning to avoid insomnia. - Advised to apply ice packs to affected areas to reduce itching. - Scheduled follow-up appointment with Manzi in one week to assess rash resolution.  Type 2 diabetes mellitus with diabetic nephropathy Type 2 diabetes mellitus with diabetic nephropathy. Blood sugars not regularly monitored due to difficulty with self-monitoring. Prednisone  may elevate blood sugar levels. - Prescribed metformin  500 mg twice daily while on prednisone  to manage potential blood  sugar elevation. - Instructed to resume metformin  500 mg every other day after completing prednisone  course. - Advised to avoid sugary foods to help manage blood sugar levels.   Family/ staff Communication: Reviewed plan of care with patient verbalized understanding   Labs/tests ordered: None   Next Appointment: Return in about 1 week (around 06/12/2024) for follow up rash with Mast Man,NP.   Total time: 20 minutes. Greater than 50% of total time spent doing patient education regarding Rash, Type 2 diabetes Mellitus,health maintenance including symptom/medication management.   Jeramy Dimmick  JAYSON Plough, NP

## 2024-06-27 ENCOUNTER — Other Ambulatory Visit: Payer: Self-pay | Admitting: Nurse Practitioner

## 2024-07-15 ENCOUNTER — Ambulatory Visit: Payer: Self-pay

## 2024-07-15 NOTE — Telephone Encounter (Signed)
 FYI Only or Action Required?: Action required by provider: request for appointment.  Patient was last seen in primary care on 06/05/2024 by Ngetich, Roxan BROCKS, NP.  Called Nurse Triage reporting Rash.  Symptoms began several weeks ago.  Interventions attempted: OTC medications: ointment.  Symptoms are: stable.  Triage Disposition: See PCP When Office is Open (Within 3 Days)  Patient/caregiver understands and will follow disposition?: Yes  Reason for Disposition  Tender bumps in armpits  Answer Assessment - Initial Assessment Questions Patient says this started after she started a medication the was prescribed last visit, looks like the metformin , but she's not sure of the name. Patient wants appointment at [facility]. Told her office will reach out to her to schedule.  1. APPEARANCE of RASH: What does the rash look like? (e.g., blisters, dry flaky skin, red spots, redness, sores)     Can not see  2. LOCATION: Where is the rash located?      Under arms, chest up to chin 3. NUMBER: How many spots are there?      Feels like there is a lot 4. SIZE: How big are the spots? (e.g., inches, cm; or compare to size of pinhead, tip of pen, eraser, pea)      Different sizes 5. ONSET: When did the rash start?      Couple weeks ago 6. ITCHING: Does the rash itch? If Yes, ask: How bad is the itch?  (Scale 0-10; or none, mild, moderate, severe)     Not right now, itches a lot at night 7. PAIN: Does the rash hurt? If Yes, ask: How bad is the pain?  (Scale 0-10; or none, mild, moderate, severe)     Burns 8. OTHER SYMPTOMS: Do you have any other symptoms? (e.g., fever)     Denies  Protocols used: Rash or Redness - Localized-A-AH

## 2024-07-15 NOTE — Telephone Encounter (Signed)
 Called patient and she stated that she wants an appointment at [facility]. [facility] next available appointment is in February. Patient was seen in office on 06/05/24 by Dinah.   Offered patient an appointment for [facility] and [facility] but patient refused due to Transportation. Stated that she wants to be seen at [facility].   Please Advise.

## 2024-07-15 NOTE — Telephone Encounter (Signed)
 Mast, Man X, NP to Me (Selected Message)     07/15/24  2:29 PM Catherine Ellis be we can reschedule someone else to work her in? Thank you       Forwarded message to Admin to see if one of the AWV for Thursday could be rescheduled and this patient to be scheduled in their spot.

## 2024-08-01 ENCOUNTER — Other Ambulatory Visit: Payer: Self-pay

## 2024-08-01 MED ORDER — HYDROCORTISONE 2.5 % EX CREA: TOPICAL_CREAM | Freq: Two times a day (BID) | CUTANEOUS | 0 refills | Status: AC

## 2024-08-08 ENCOUNTER — Encounter: Admitting: Nurse Practitioner

## 2024-08-08 ENCOUNTER — Non-Acute Institutional Stay: Payer: Self-pay | Admitting: Family Medicine

## 2024-08-08 DIAGNOSIS — I10 Essential (primary) hypertension: Secondary | ICD-10-CM

## 2024-08-08 DIAGNOSIS — R413 Other amnesia: Secondary | ICD-10-CM

## 2024-08-08 DIAGNOSIS — E119 Type 2 diabetes mellitus without complications: Secondary | ICD-10-CM

## 2024-08-08 NOTE — Assessment & Plan Note (Addendum)
 Only using metformin , 500 mg once a day.Last A1C

## 2024-08-08 NOTE — Progress Notes (Signed)
 " Provider:  Garnette Pinal, MD Location:      Place of Service:     PCP: Mast, Man X, NP Patient Care Team: Mast, Man X, NP as PCP - General (Internal Medicine) Mast, Man X, NP as Nurse Practitioner (Internal Medicine) Josh Server, MD as Referring Physician (Ophthalmology)  Extended Emergency Contact Information Primary Emergency Contact: [name],[name]  [place] Home Phone: [phone] Mobile Phone: [phone] Relation: Niece  Code Status:  Goals of Care: Advanced Directive information    06/05/2024    9:51 AM  Advanced Directives  Does Patient Have a Medical Advance Directive? No  Would patient like information on creating a medical advance directive? No - Patient declined     HPI: Patient is a 89 y.o. female seen today for admission to [facility] assisted living. Had formerly lived in apt independently.  No recent life changes; had fall 1 year ago. She states some concern about her getting in and out of tub.  No needs around ADL's.  She tells me move was financial initiated by facility rather than her needing more care.  Past Medical History:  Diagnosis Date   Allergy    Arthritis    Diabetes mellitus    Hernia, abdominal    Hyperlipidemia    Hypertension    Osteopenia, senile 09/26/2012   T score - 1.6 Left femur   Past Surgical History:  Procedure Laterality Date   APPENDECTOMY     BACK SURGERY     1965   DENTAL SURGERY  01/17/2024   EYE SURGERY     GUM SURGERY     KNEE SURGERY      reports that she has never smoked. She has never used smokeless tobacco. She reports that she does not drink alcohol  and does not use drugs. Social History   Socioeconomic History   Marital status: Single    Spouse name: Not on file   Number of children: Not on file   Years of education: Not on file   Highest education level: Not on file  Occupational History   Not on file  Tobacco Use   Smoking status: Never   Smokeless tobacco: Never   Vaping Use   Vaping status: Never Used  Substance and Sexual Activity   Alcohol  use: No   Drug use: No   Sexual activity: Not on file  Other Topics Concern   Not on file  Social History Narrative   Tobacco use, amount per day now: None      Past tobacco use, amount per day: None      How many years did you use tobacco: None      Alcohol  use (drinks per week): None      Diet: Regular      Do you drink/eat things with caffeine? Sweet tea      Marital status: Single            What year were you married?      Do you live in a house, apartment, assisted living, condo, trailer? Apartment      Is it one or more stories? 1      How many persons live in your home? 1      Do you have any pets in your home? 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 Yes             How often?  Water exercise 5 days a week.        Do you have a living will? Yes      Do you have a DNR form? No            If not, do you want to discuss one? Yes      Do you have signed POA/HPOA forms?  Yes            Social Drivers of Health   Tobacco Use: Low Risk (06/05/2024)   Patient History    Smoking Tobacco Use: Never    Smokeless Tobacco Use: Never    Passive Exposure: Not on file  Financial Resource Strain: Not on file  Food Insecurity: Not on file  Transportation Needs: Not on file  Physical Activity: Not on file  Stress: Not on file  Social Connections: Not on file  Intimate Partner Violence: Not on file  Depression (PHQ2-9): Low Risk (06/05/2024)   Depression (PHQ2-9)    PHQ-2 Score: 0  Alcohol  Screen: Not on file  Housing: Unknown (04/26/2024)   Received from [facility]   Epic    Unable to Pay for Housing in the Last Year: Not on file    Number of Times Moved in the Last Year: Not on file    At any time in the past 12 months, were you homeless or living in a shelter (including now)?: No  Utilities: Not on file  Health Literacy: Not on file     Functional Status Survey:    Family History  Problem Relation Age of Onset   Stroke Mother    Heart disease Father     Health Maintenance  Topic Date Due   Zoster Vaccines- Shingrix (2 of 2) 05/09/2023   Influenza Vaccine  02/09/2024   FOOT EXAM  03/01/2024   Medicare Annual Wellness (AWV)  03/15/2024   HEMOGLOBIN A1C  07/18/2024   COVID-19 Vaccine (5 - 2025-26 season) 06/24/2030 (Originally 03/11/2024)   OPHTHALMOLOGY EXAM  05/28/2025   DTaP/Tdap/Td (3 - Td or Tdap) 03/13/2033   Pneumococcal Vaccine: 50+ Years  Completed   Bone Density Scan  Completed   Meningococcal B Vaccine  Aged Out    Allergies[1]  Outpatient Encounter Medications as of 08/08/2024  Medication Sig   atorvastatin  (LIPITOR) 10 MG tablet TAKE ONE TABLET BY MOUTH ONCE DAILY   Calcium -Magnesium-Vitamin D  (CALCIUM  1200+D3 PO) Take 1 tablet by mouth daily.   cyanocobalamin  (VITAMIN B12) 1000 MCG tablet Take 1,000 mcg by mouth daily.   hydrocortisone  2.5%-Eucerin equivalent 1:1 cream mixture Apply topically 2 (two) times daily.   lisinopril  (ZESTRIL ) 10 MG tablet TAKE ONE TABLET BY MOUTH DAILY   magnesium gluconate (MAGONATE) 500 (27 Mg) MG TABS tablet Take 500 mg by mouth daily.   metFORMIN  (GLUCOPHAGE ) 500 MG tablet Take 1 tablet (500 mg total) by mouth daily with breakfast. For 5 days while on prednisone  then resume every other day.   Multiple Vitamins-Minerals (PRESERVISION AREDS 2+MULTI VIT PO) Take 1 tablet by mouth 2 (two) times daily.   vitamin C (ASCORBIC ACID) 500 MG tablet Take 500 mg by mouth daily.   Vitamin D -Vitamin K (VITAMIN K2-VITAMIN D3 PO) Take by mouth daily.   vitamin E 180 MG (400 UNITS) capsule Take 400 Units by mouth daily.   Facility-Administered Encounter Medications as of 08/08/2024  Medication   0.9 %  sodium chloride  infusion    Review of Systems  Constitutional: Negative.   HENT:  Positive for hearing  loss.   Eyes:  Positive for visual disturbance.  Respiratory: Negative.     Cardiovascular: Negative.   Gastrointestinal: Negative.   Endocrine: Negative.   Musculoskeletal:  Positive for gait problem.  Skin:  Positive for rash.  Psychiatric/Behavioral: Negative.    All other systems reviewed and are negative.   There were no vitals filed for this visit. There is no height or weight on file to calculate BMI. Physical Exam Vitals and nursing note reviewed.  Constitutional:      Appearance: Normal appearance.  HENT:     Head: Normocephalic.  Cardiovascular:     Rate and Rhythm: Normal rate and regular rhythm.  Musculoskeletal:     Cervical back: Normal range of motion.     Comments: Uses walker for ambulation  Skin:    Comments: Has umbilical hernia and mass (superficial) on back. Had MRI(non-diagnostic)  Neurological:     Mental Status: She is alert.     Labs reviewed: Basic Metabolic Panel: Recent Labs    09/07/23 0718  NA 140  K 4.5  CL 104  CO2 24  GLUCOSE 135*  BUN 18  CREATININE 0.77  CALCIUM  9.3   Liver Function Tests: Recent Labs    09/07/23 0718  AST 14  ALT 12  BILITOT 0.8  PROT 6.6   No results for input(s): LIPASE, AMYLASE in the last 8760 hours. No results for input(s): AMMONIA in the last 8760 hours. CBC: Recent Labs    09/07/23 0718  WBC 8.1  NEUTROABS 2,981  HGB 13.1  HCT 38.9  MCV 91.3  PLT 267   Cardiac Enzymes: No results for input(s): CKTOTAL, CKMB, CKMBINDEX, TROPONINI in the last 8760 hours. BNP: Invalid input(s): POCBNP Lab Results  Component Value Date   HGBA1C 6.8 (H) 01/16/2024   Lab Results  Component Value Date   TSH 3.05 10/25/2022   Lab Results  Component Value Date   VITAMINB12 657 12/10/2019   No results found for: FOLATE No results found for: IRON, TIBC, FERRITIN  Imaging and Procedures obtained prior to SNF admission: No results found.  Assessment/Plan Assessment & Plan Primary hypertension BP controller on Lisinopril  Diabetes mellitus type  2, controlled (HCC) Only using metformin , 500 mg once a day.Last A1C Memory loss    Family/ staff Communication:   Labs/tests ordered:  Garnette HERO. Cleotilde, MD [facility] [address] [place], [place] [address] Office [phone]      [1] No Known Allergies  "

## 2024-08-08 NOTE — Assessment & Plan Note (Addendum)
 BP controller on Lisinopril 

## 2024-08-09 ENCOUNTER — Non-Acute Institutional Stay: Payer: Self-pay | Admitting: Nurse Practitioner

## 2024-08-09 ENCOUNTER — Encounter: Payer: Self-pay | Admitting: Nurse Practitioner

## 2024-08-09 DIAGNOSIS — R21 Rash and other nonspecific skin eruption: Secondary | ICD-10-CM

## 2024-08-09 DIAGNOSIS — N3942 Incontinence without sensory awareness: Secondary | ICD-10-CM

## 2024-08-09 DIAGNOSIS — E785 Hyperlipidemia, unspecified: Secondary | ICD-10-CM

## 2024-08-09 DIAGNOSIS — E119 Type 2 diabetes mellitus without complications: Secondary | ICD-10-CM | POA: Diagnosis not present

## 2024-08-09 DIAGNOSIS — R222 Localized swelling, mass and lump, trunk: Secondary | ICD-10-CM

## 2024-08-09 DIAGNOSIS — G3184 Mild cognitive impairment, so stated: Secondary | ICD-10-CM

## 2024-08-09 DIAGNOSIS — F418 Other specified anxiety disorders: Secondary | ICD-10-CM | POA: Diagnosis not present

## 2024-08-09 DIAGNOSIS — Z7984 Long term (current) use of oral hypoglycemic drugs: Secondary | ICD-10-CM | POA: Diagnosis not present

## 2024-08-09 DIAGNOSIS — M810 Age-related osteoporosis without current pathological fracture: Secondary | ICD-10-CM

## 2024-08-09 DIAGNOSIS — M5431 Sciatica, right side: Secondary | ICD-10-CM

## 2024-08-09 DIAGNOSIS — I10 Essential (primary) hypertension: Secondary | ICD-10-CM | POA: Diagnosis not present

## 2024-08-09 NOTE — Assessment & Plan Note (Signed)
 takes Alendronate. Ca, Vit D. DEXA 12/24/20 t score -2.6, 03/02/23 declined DEXA

## 2024-08-09 NOTE — Assessment & Plan Note (Signed)
 restart Metformin  500mg  qd, last Hgb a1c 6.5 11/19/20<<7.2 07/15/22<<8.1 05/29/24

## 2024-08-09 NOTE — Assessment & Plan Note (Signed)
 Her mood has no change, off Trazodone , TSH 3.05 10/25/22

## 2024-08-09 NOTE — Assessment & Plan Note (Signed)
 LDL 126 05/30/23, taking Atorvastatin

## 2024-08-09 NOTE — Assessment & Plan Note (Signed)
"   Left lower back lump, left, size of a large egg, progressing, sore to touch, getting bigger.               MR 01/29/24  Superficial heterogeneously contrast enhancing mass in the subcutaneous left buttock measuring 6.4 x 3.5 cm. This is new compared to prior examination dated 2015 of uncertain nature; neither clearly benign nor malignant. Consider tissue sampling or excision if clinically appropriate given very advanced patient age. The patient declined procedures scheduled 05/16/24 "

## 2024-08-09 NOTE — Progress Notes (Signed)
 This encounter was created in error - please disregard.

## 2024-08-09 NOTE — Progress Notes (Signed)
 " Location:  [facility] Room Number: 900 Floor [id number] Place of Service:  ALF [phone]) Provider:  Fiore Detjen X, NP  Nigel Ericsson X, NP  Patient Care Team: Mileena Rothenberger X, NP as PCP - General (Internal Medicine) Girtie Wiersma X, NP as Nurse Practitioner (Internal Medicine) Josh Server, MD as Referring Physician (Ophthalmology)  Extended Emergency Contact Information Primary Emergency Contact: [name],[name]  [place] Home Phone: [phone] Mobile Phone: [phone] Relation: Niece  Code Status:  Full Code Goals of care: Advanced Directive information    08/09/2024    8:27 AM  Advanced Directives  Does Patient Have a Medical Advance Directive? No  Would patient like information on creating a medical advance directive? No - Patient declined     Chief Complaint  Patient presents with   MEDICATION REVIEW    HPI:  Pt is a 89 y.o. female seen today for an acute visit for medication review    Muscle cramps, better,  Labs unremarkable             HTN, blood pressure is controlled on Lisinopril , Atorvastatin , Bun/creat 18/0.77 09/07/23             LDL 126 05/30/23, taking Atorvastatin              Her mood has no change, off Trazodone , TSH 3.05 10/25/22             T2DM, restart Metformin  500mg  qd, last Hgb a1c 6.5 11/19/20<<7.2 07/15/22<<8.1 05/29/24 Urinary frequency, off  Oxybutynin , c/o  dry eyes, mouth, blurred vision.              OP takes Alendronate . Ca, Vit D. DEXA 12/24/20 t score -2.6, 03/02/23 declined DEXA             Lower back pain, right sciatica, comes and goes, chronic.             Left lower back lump, left, size of a large egg, progressing, sore to touch, getting bigger.               MR 01/29/24  Superficial heterogeneously contrast enhancing mass in the subcutaneous left buttock measuring 6.4 x 3.5 cm. This is new compared to prior examination dated 2015 of uncertain nature; neither clearly benign nor malignant. Consider tissue sampling  or excision if clinically appropriate given very advanced patient age. The patient declined procedures scheduled 05/16/24  Past Medical History:  Diagnosis Date   Allergy    Arthritis    Diabetes mellitus    Hernia, abdominal    Hyperlipidemia    Hypertension    Osteopenia, senile 09/26/2012   T score - 1.6 Left femur   Past Surgical History:  Procedure Laterality Date   APPENDECTOMY     BACK SURGERY     1965   DENTAL SURGERY  01/17/2024   EYE SURGERY     GUM SURGERY     KNEE SURGERY      Allergies[1]  Outpatient Encounter Medications as of 08/09/2024  Medication Sig   atorvastatin  (LIPITOR) 10 MG tablet TAKE ONE TABLET BY MOUTH ONCE DAILY   cyanocobalamin  (VITAMIN B12) 1000 MCG tablet Take 1,000 mcg by mouth daily.   lisinopril  (ZESTRIL ) 10 MG tablet TAKE ONE TABLET BY MOUTH DAILY   magnesium gluconate (MAGONATE) 500 (27 Mg) MG TABS tablet Take 500 mg by mouth daily.   metFORMIN  (GLUCOPHAGE ) 500 MG tablet Take 1 tablet (500 mg total) by mouth daily with breakfast. For  5 days while on prednisone  then resume every other day.   Multiple Vitamins-Minerals (PRESERVISION AREDS 2+MULTI VIT PO) Take 1 tablet by mouth 2 (two) times daily.   Calcium -Magnesium-Vitamin D  (CALCIUM  1200+D3 PO) Take 1 tablet by mouth daily. (Patient not taking: Reported on 08/09/2024)   hydrocortisone  2.5%-Eucerin equivalent 1:1 cream mixture Apply topically 2 (two) times daily. (Patient not taking: Reported on 08/09/2024)   vitamin C (ASCORBIC ACID) 500 MG tablet Take 500 mg by mouth daily. (Patient not taking: Reported on 08/09/2024)   Vitamin D -Vitamin K (VITAMIN K2-VITAMIN D3 PO) Take by mouth daily. (Patient not taking: Reported on 08/09/2024)   vitamin E 180 MG (400 UNITS) capsule Take 400 Units by mouth daily. (Patient not taking: Reported on 08/09/2024)   Facility-Administered Encounter Medications as of 08/09/2024  Medication   0.9 %  sodium chloride  infusion    Review of Systems  Constitutional:   Negative for appetite change, fatigue and fever.  HENT:  Positive for hearing loss. Negative for congestion and sore throat.   Eyes:  Negative for visual disturbance.  Respiratory:  Negative for cough, shortness of breath and wheezing.   Cardiovascular:  Negative for leg swelling.  Gastrointestinal:  Negative for abdominal pain, diarrhea, nausea and vomiting.       Acid reflux, belching at times, mucous collected in the back of mouth/throat.   Genitourinary:  Positive for frequency. Negative for dysuria, hematuria and urgency.       Average 6-7x/night.   Musculoskeletal:  Positive for arthralgias, back pain and gait problem.       Right sciatica. Walks with walker and cane. Left buttock and leg cramps occasionally when getting out of bed  Skin:  Positive for rash.  Neurological:  Negative for speech difficulty, weakness and headaches.  Psychiatric/Behavioral:  Positive for sleep disturbance. Negative for behavioral problems. The patient is not nervous/anxious.        Memory lapses.     Immunization History  Administered Date(s) Administered   INFLUENZA, HIGH DOSE SEASONAL PF 04/24/2019, 04/22/2020, 05/10/2023   Influenza Split 04/13/2012   Influenza Whole 04/12/2018   Influenza,inj,Quad PF,6+ Mos 06/14/2013, 04/14/2014, 04/27/2015   Influenza-Unspecified 04/10/2016   Moderna Covid-19 Vaccine Bivalent Booster 29yrs & up 12/08/2020   Moderna Sars-Covid-2 Vaccination 07/15/2019, 08/12/2019, 05/19/2020   PPD Test 12/16/2011, 12/16/2011   Pneumococcal Conjugate-13 07/14/2014   Pneumococcal Polysaccharide-23 01/04/2010   Tdap 12/16/2011, 03/14/2023   Zoster Recombinant(Shingrix) 03/14/2023   Pertinent  Health Maintenance Due  Topic Date Due   Influenza Vaccine  02/09/2024   FOOT EXAM  03/01/2024   HEMOGLOBIN A1C  07/18/2024   OPHTHALMOLOGY EXAM  05/28/2025   Bone Density Scan  Completed      06/29/2023    1:58 PM 07/13/2023    1:24 PM 09/14/2023    1:08 PM 01/18/2024    2:46 PM  06/05/2024    9:50 AM  Fall Risk  Falls in the past year? 0 1 1 1  0  Was there an injury with Fall? 0  0  0  0  0   Fall Risk Category Calculator 0 1 1 1  0  Patient at Risk for Falls Due to    No Fall Risks No Fall Risks  Fall risk Follow up    Falls evaluation completed Falls evaluation completed     Data saved with a previous flowsheet row definition   Functional Status Survey:    Vitals:   08/09/24 0817 08/09/24 0818  BP: (!) 146/50 132/71  Pulse: 80   Resp: 18   Temp: (!) 97.3 F (36.3 C)   SpO2: 97%   Weight: 155 lb 6.4 oz (70.5 kg)    Body mass index is 27.53 kg/m. Physical Exam Vitals and nursing note reviewed.  Constitutional:      Appearance: Normal appearance.  HENT:     Head: Normocephalic and atraumatic.     Right Ear: Tympanic membrane normal.     Left Ear: Tympanic membrane normal.     Ears:     Comments: R ear impacted cerumen removed with ear lavage.     Nose: Nose normal. No congestion or rhinorrhea.     Mouth/Throat:     Mouth: Mucous membranes are moist.  Eyes:     Extraocular Movements: Extraocular movements intact.     Conjunctiva/sclera: Conjunctivae normal.     Pupils: Pupils are equal, round, and reactive to light.  Cardiovascular:     Rate and Rhythm: Normal rate and regular rhythm.     Heart sounds: No murmur heard. Pulmonary:     Effort: Pulmonary effort is normal.     Breath sounds: Rales present. No rhonchi.     Comments: Bibasilar  Abdominal:     General: Bowel sounds are normal.     Palpations: Abdomen is soft.     Tenderness: There is no abdominal tenderness.     Comments: Umbilical hernia. Pain occasionally, lasts about an hour so, resolves w/o intervention.   Musculoskeletal:     Cervical back: Normal range of motion and neck supple.     Right lower leg: No edema.     Left lower leg: No edema.  Skin:    General: Skin is warm and dry.     Findings: Rash present.     Comments: Upper left buttock cyst about a golf ball  seized, no pain. F/u dermatology.  Diffuse rash in upper chest, shoulders, under arms, and in ankles  Neurological:     Mental Status: She is alert. Mental status is at baseline.     Gait: Gait abnormal.     Comments: Oriented to person, place.   Psychiatric:        Mood and Affect: Mood normal.        Behavior: Behavior normal.        Thought Content: Thought content normal.     Labs reviewed: Recent Labs    09/07/23 0718  NA 140  K 4.5  CL 104  CO2 24  GLUCOSE 135*  BUN 18  CREATININE 0.77  CALCIUM  9.3   Recent Labs    09/07/23 0718  AST 14  ALT 12  BILITOT 0.8  PROT 6.6   Recent Labs    09/07/23 0718  WBC 8.1  NEUTROABS 2,981  HGB 13.1  HCT 38.9  MCV 91.3  PLT 267   Lab Results  Component Value Date   TSH 3.05 10/25/2022   Lab Results  Component Value Date   HGBA1C 6.8 (H) 01/16/2024   Lab Results  Component Value Date   CHOL 233 (H) 05/30/2023   HDL 63 05/30/2023   LDLCALC 126 (H) 05/30/2023   LDLDIRECT 133 (H) 07/14/2014   TRIG 288 (H) 05/30/2023   CHOLHDL 3.7 05/30/2023    Significant Diagnostic Results in last 30 days:  No results found.  Assessment/Plan Diabetes mellitus type 2, controlled (HCC)  restart Metformin  500mg  qd, last Hgb a1c 6.5 11/19/20<<7.2 07/15/22<<8.1 05/29/24  Incontinent of urine off  Oxybutynin , c/o  dry  eyes, mouth, blurred vision.   Osteoporosis takes Alendronate . Ca, Vit D. DEXA 12/24/20 t score -2.6, 03/02/23 declined DEXA  Back pain with right-sided sciatica  Lower back pain, right sciatica, comes and goes, chronic.   Palpable mass of lower back  Left lower back lump, left, size of a large egg, progressing, sore to touch, getting bigger.               MR 01/29/24  Superficial heterogeneously contrast enhancing mass in the subcutaneous left buttock measuring 6.4 x 3.5 cm. This is new compared to prior examination dated 2015 of uncertain nature; neither clearly benign nor malignant. Consider tissue sampling  or excision if clinically appropriate given very advanced patient age. The patient declined procedures scheduled 05/16/24  Hypertension  blood pressure is controlled on Lisinopril , Atorvastatin , Bun/creat 18/0.77 09/07/23  Hyperlipidemia  LDL 126 05/30/23, taking Atorvastatin   Mild cognitive impairment Admitted to assisted living for care needs  Depression with anxiety Her mood has no change, off Trazodone , TSH 3.05 10/25/22  Rash Diffuse rash in upper chest, shoulders, under arms, and in ankles Will apply 2.5% hydrocortisone  cream twice daily to affected areas until healed     Family/ staff Communication: Plan of care reviewed with the patient, the patient's HPOA-niece, and charge nurse  Labs/tests ordered: CBC/differential, CMP/eGFR, lipids, vitamin B12, vitamin D , HgbA1c, TSH     [1] No Known Allergies  "

## 2024-08-09 NOTE — Assessment & Plan Note (Signed)
 off  Oxybutynin , c/o  dry eyes, mouth, blurred vision.

## 2024-08-09 NOTE — Assessment & Plan Note (Signed)
 Lower back pain, right sciatica, comes and goes, chronic.

## 2024-08-09 NOTE — Assessment & Plan Note (Signed)
 blood pressure is controlled on Lisinopril, Atorvastatin, Bun/creat 18/0.77 09/07/23

## 2024-08-09 NOTE — Assessment & Plan Note (Signed)
 Admitted to assisted living for care needs
